# Patient Record
Sex: Male | Born: 1935 | Race: White | Hispanic: No | Smoking: Former smoker
Health system: Southern US, Community
[De-identification: ages and names within clinical notes are randomized; demographics above are authoritative.]

## PROBLEM LIST (undated history)

## (undated) DIAGNOSIS — H269 Unspecified cataract: Secondary | ICD-10-CM

## (undated) DIAGNOSIS — H409 Unspecified glaucoma: Secondary | ICD-10-CM

## (undated) DIAGNOSIS — C61 Malignant neoplasm of prostate: Secondary | ICD-10-CM

## (undated) DIAGNOSIS — Z66 Do not resuscitate: Secondary | ICD-10-CM

## (undated) DIAGNOSIS — F0390 Unspecified dementia without behavioral disturbance: Secondary | ICD-10-CM

## (undated) DIAGNOSIS — J301 Allergic rhinitis due to pollen: Secondary | ICD-10-CM

## (undated) DIAGNOSIS — E039 Hypothyroidism, unspecified: Secondary | ICD-10-CM

## (undated) DIAGNOSIS — N182 Chronic kidney disease, stage 2 (mild): Secondary | ICD-10-CM

## (undated) DIAGNOSIS — K5790 Diverticulosis of intestine, part unspecified, without perforation or abscess without bleeding: Secondary | ICD-10-CM

## (undated) DIAGNOSIS — R609 Edema, unspecified: Secondary | ICD-10-CM

## (undated) DIAGNOSIS — E785 Hyperlipidemia, unspecified: Secondary | ICD-10-CM

## (undated) DIAGNOSIS — I10 Essential (primary) hypertension: Secondary | ICD-10-CM

## (undated) DIAGNOSIS — K219 Gastro-esophageal reflux disease without esophagitis: Secondary | ICD-10-CM

## (undated) DIAGNOSIS — R269 Unspecified abnormalities of gait and mobility: Secondary | ICD-10-CM

## (undated) DIAGNOSIS — I2510 Atherosclerotic heart disease of native coronary artery without angina pectoris: Secondary | ICD-10-CM

## (undated) DIAGNOSIS — M109 Gout, unspecified: Secondary | ICD-10-CM

## (undated) DIAGNOSIS — R739 Hyperglycemia, unspecified: Secondary | ICD-10-CM

## (undated) DIAGNOSIS — E559 Vitamin D deficiency, unspecified: Secondary | ICD-10-CM

## (undated) DIAGNOSIS — M1990 Unspecified osteoarthritis, unspecified site: Secondary | ICD-10-CM

## (undated) DIAGNOSIS — K449 Diaphragmatic hernia without obstruction or gangrene: Secondary | ICD-10-CM

## (undated) DIAGNOSIS — I219 Acute myocardial infarction, unspecified: Secondary | ICD-10-CM

## (undated) HISTORY — DX: Acute myocardial infarction, unspecified: I21.9

## (undated) HISTORY — DX: Diaphragmatic hernia without obstruction or gangrene: K44.9

## (undated) HISTORY — DX: Allergic rhinitis due to pollen: J30.1

## (undated) HISTORY — DX: Unspecified abnormalities of gait and mobility: R26.9

## (undated) HISTORY — DX: Atherosclerotic heart disease of native coronary artery without angina pectoris: I25.10

## (undated) HISTORY — DX: Unspecified glaucoma: H40.9

## (undated) HISTORY — DX: Hyperglycemia, unspecified: R73.9

## (undated) HISTORY — PX: COLONOSCOPY: SHX174

## (undated) HISTORY — DX: Gout, unspecified: M10.9

## (undated) HISTORY — DX: Chronic kidney disease, stage 2 (mild): N18.2

---

## 1997-05-15 HISTORY — PX: EYE SURGERY: SHX253

## 1999-02-25 ENCOUNTER — Other Ambulatory Visit: Admission: RE | Admit: 1999-02-25 | Discharge: 1999-02-25 | Payer: Self-pay | Admitting: Gastroenterology

## 2000-12-21 ENCOUNTER — Other Ambulatory Visit: Admission: RE | Admit: 2000-12-21 | Discharge: 2000-12-21 | Payer: Self-pay | Admitting: Urology

## 2000-12-25 ENCOUNTER — Ambulatory Visit: Admission: RE | Admit: 2000-12-25 | Discharge: 2001-03-25 | Payer: Self-pay | Admitting: Radiation Oncology

## 2001-03-26 ENCOUNTER — Ambulatory Visit: Admission: RE | Admit: 2001-03-26 | Discharge: 2001-06-24 | Payer: Self-pay | Admitting: Radiation Oncology

## 2002-05-15 HISTORY — PX: SKIN CANCER EXCISION: SHX779

## 2003-12-01 ENCOUNTER — Ambulatory Visit: Admission: RE | Admit: 2003-12-01 | Discharge: 2003-12-01 | Payer: Self-pay | Admitting: Radiation Oncology

## 2004-12-07 ENCOUNTER — Ambulatory Visit: Admission: RE | Admit: 2004-12-07 | Discharge: 2004-12-21 | Payer: Self-pay | Admitting: Radiation Oncology

## 2010-06-28 ENCOUNTER — Emergency Department (HOSPITAL_COMMUNITY)
Admission: EM | Admit: 2010-06-28 | Discharge: 2010-06-28 | Disposition: A | Discharge status: Home / Self Care | Payer: Medicare Other | Attending: Emergency Medicine | Admitting: Emergency Medicine

## 2012-05-15 DIAGNOSIS — R739 Hyperglycemia, unspecified: Secondary | ICD-10-CM

## 2012-05-15 HISTORY — DX: Hyperglycemia, unspecified: R73.9

## 2012-06-01 ENCOUNTER — Encounter (HOSPITAL_COMMUNITY): Payer: Self-pay | Admitting: *Deleted

## 2012-06-01 ENCOUNTER — Inpatient Hospital Stay (HOSPITAL_COMMUNITY)
Admission: EM | Admit: 2012-06-01 | Discharge: 2012-06-05 | DRG: 282 | Disposition: A | Discharge status: Skilled Nursing Facility | Payer: Medicare Other | Attending: Cardiology | Admitting: Cardiology

## 2012-06-01 ENCOUNTER — Emergency Department (HOSPITAL_COMMUNITY): Payer: Medicare Other

## 2012-06-01 DIAGNOSIS — M129 Arthropathy, unspecified: Secondary | ICD-10-CM | POA: Diagnosis present

## 2012-06-01 DIAGNOSIS — I129 Hypertensive chronic kidney disease with stage 1 through stage 4 chronic kidney disease, or unspecified chronic kidney disease: Secondary | ICD-10-CM | POA: Diagnosis present

## 2012-06-01 DIAGNOSIS — E876 Hypokalemia: Secondary | ICD-10-CM

## 2012-06-01 DIAGNOSIS — I214 Non-ST elevation (NSTEMI) myocardial infarction: Principal | ICD-10-CM

## 2012-06-01 DIAGNOSIS — Z8546 Personal history of malignant neoplasm of prostate: Secondary | ICD-10-CM

## 2012-06-01 DIAGNOSIS — R079 Chest pain, unspecified: Secondary | ICD-10-CM

## 2012-06-01 DIAGNOSIS — I10 Essential (primary) hypertension: Secondary | ICD-10-CM | POA: Diagnosis present

## 2012-06-01 DIAGNOSIS — Z79899 Other long term (current) drug therapy: Secondary | ICD-10-CM

## 2012-06-01 DIAGNOSIS — Z66 Do not resuscitate: Secondary | ICD-10-CM | POA: Diagnosis present

## 2012-06-01 DIAGNOSIS — E559 Vitamin D deficiency, unspecified: Secondary | ICD-10-CM | POA: Diagnosis present

## 2012-06-01 DIAGNOSIS — I255 Ischemic cardiomyopathy: Secondary | ICD-10-CM

## 2012-06-01 DIAGNOSIS — I2589 Other forms of chronic ischemic heart disease: Secondary | ICD-10-CM | POA: Diagnosis present

## 2012-06-01 DIAGNOSIS — Z87891 Personal history of nicotine dependence: Secondary | ICD-10-CM

## 2012-06-01 DIAGNOSIS — N183 Chronic kidney disease, stage 3 unspecified: Secondary | ICD-10-CM | POA: Diagnosis present

## 2012-06-01 DIAGNOSIS — E785 Hyperlipidemia, unspecified: Secondary | ICD-10-CM | POA: Diagnosis present

## 2012-06-01 DIAGNOSIS — E039 Hypothyroidism, unspecified: Secondary | ICD-10-CM | POA: Diagnosis present

## 2012-06-01 DIAGNOSIS — K219 Gastro-esophageal reflux disease without esophagitis: Secondary | ICD-10-CM | POA: Diagnosis present

## 2012-06-01 HISTORY — DX: Diverticulosis of intestine, part unspecified, without perforation or abscess without bleeding: K57.90

## 2012-06-01 HISTORY — DX: Malignant neoplasm of prostate: C61

## 2012-06-01 HISTORY — DX: Vitamin D deficiency, unspecified: E55.9

## 2012-06-01 HISTORY — DX: Essential (primary) hypertension: I10

## 2012-06-01 HISTORY — DX: Unspecified cataract: H26.9

## 2012-06-01 HISTORY — DX: Gastro-esophageal reflux disease without esophagitis: K21.9

## 2012-06-01 HISTORY — DX: Hypothyroidism, unspecified: E03.9

## 2012-06-01 HISTORY — DX: Hyperlipidemia, unspecified: E78.5

## 2012-06-01 HISTORY — DX: Unspecified osteoarthritis, unspecified site: M19.90

## 2012-06-01 HISTORY — DX: Do not resuscitate: Z66

## 2012-06-01 LAB — GLUCOSE, CAPILLARY
Glucose-Capillary: 120 mg/dL — ABNORMAL HIGH (ref 70–99)
Glucose-Capillary: 115 mg/dL — ABNORMAL HIGH (ref 70–99)
Glucose-Capillary: 121 mg/dL — ABNORMAL HIGH (ref 70–99)

## 2012-06-01 LAB — CBC WITH DIFFERENTIAL/PLATELET
Basophils Absolute: 0 10*3/uL (ref 0.0–0.1)
Basophils Relative: 1 % (ref 0–1)
HCT: 39.2 % (ref 39.0–52.0)
Hemoglobin: 13.7 g/dL (ref 13.0–17.0)
Lymphocytes Relative: 31 % (ref 12–46)
MCHC: 34.9 g/dL (ref 30.0–36.0)
Monocytes Absolute: 0.6 10*3/uL (ref 0.1–1.0)
Neutro Abs: 3.4 10*3/uL (ref 1.7–7.7)
Neutrophils Relative %: 57 % (ref 43–77)
RDW: 12.2 % (ref 11.5–15.5)
WBC: 6 10*3/uL (ref 4.0–10.5)

## 2012-06-01 LAB — POCT I-STAT, CHEM 8
Calcium, Ion: 1.09 mmol/L — ABNORMAL LOW (ref 1.13–1.30)
Glucose, Bld: 151 mg/dL — ABNORMAL HIGH (ref 70–99)
HCT: 38 % — ABNORMAL LOW (ref 39.0–52.0)
Hemoglobin: 12.9 g/dL — ABNORMAL LOW (ref 13.0–17.0)
TCO2: 27 mmol/L (ref 0–100)

## 2012-06-01 LAB — BASIC METABOLIC PANEL
BUN: 24 mg/dL — ABNORMAL HIGH (ref 6–23)
BUN: 24 mg/dL — ABNORMAL HIGH (ref 6–23)
CO2: 23 mEq/L (ref 19–32)
CO2: 24 mEq/L (ref 19–32)
Chloride: 96 mEq/L (ref 96–112)
Chloride: 96 mEq/L (ref 96–112)
Chloride: 94 mEq/L — ABNORMAL LOW (ref 96–112)
Creatinine, Ser: 1.33 mg/dL (ref 0.50–1.35)
Creatinine, Ser: 1.25 mg/dL (ref 0.50–1.35)
Creatinine, Ser: 1.12 mg/dL (ref 0.50–1.35)
GFR calc Af Amer: 53 mL/min — ABNORMAL LOW (ref >90)
GFR calc non Af Amer: 45 mL/min — ABNORMAL LOW (ref >90)
Glucose, Bld: 144 mg/dL — ABNORMAL HIGH (ref 70–99)
Potassium: 2.8 mEq/L — ABNORMAL LOW (ref 3.5–5.1)

## 2012-06-01 LAB — CBC
HCT: 41.7 % (ref 39.0–52.0)
Hemoglobin: 14.9 g/dL (ref 13.0–17.0)
Hemoglobin: 14.4 g/dL (ref 13.0–17.0)
MCV: 98.6 fL (ref 78.0–100.0)
MCV: 98.8 fL (ref 78.0–100.0)
Platelets: 215 10*3/uL (ref 150–400)
Platelets: 195 10*3/uL (ref 150–400)
RBC: 4.23 MIL/uL (ref 4.22–5.81)
RBC: 4.05 MIL/uL — ABNORMAL LOW (ref 4.22–5.81)
WBC: 7.1 10*3/uL (ref 4.0–10.5)
WBC: 5.9 10*3/uL (ref 4.0–10.5)

## 2012-06-01 LAB — PROTIME-INR
INR: 0.92 (ref 0.00–1.49)
Prothrombin Time: 13.7 seconds (ref 11.6–15.2)

## 2012-06-01 LAB — TROPONIN I
Troponin I: 1.1 ng/mL (ref <0.30)
Troponin I: 1.25 ng/mL (ref <0.30)

## 2012-06-01 LAB — POCT I-STAT TROPONIN I: Troponin i, poc: 0.35 ng/mL (ref 0.00–0.08)

## 2012-06-01 LAB — APTT: aPTT: 24 seconds (ref 24–37)

## 2012-06-01 MED ORDER — NITROGLYCERIN 0.4 MG SL SUBL: 0.4000 mg | SUBLINGUAL_TABLET | SUBLINGUAL | Status: DC | PRN

## 2012-06-01 MED ORDER — SODIUM CHLORIDE 0.9 % IV SOLN
INTRAVENOUS | Status: AC
  Administered 2012-06-01: 1000 mL via INTRAVENOUS

## 2012-06-01 MED ORDER — HEPARIN BOLUS VIA INFUSION
5000.0000 [IU] | Freq: Once | INTRAVENOUS | Status: AC
  Administered 2012-06-01: 5000 [IU] via INTRAVENOUS

## 2012-06-01 MED ORDER — SODIUM CHLORIDE 0.9 % IV SOLN: INTRAVENOUS | Status: DC

## 2012-06-01 MED ORDER — GI COCKTAIL ~~LOC~~
30.0000 mL | Freq: Three times a day (TID) | ORAL | Status: DC | PRN
  Filled 2012-06-01: qty 30

## 2012-06-01 MED ORDER — ATORVASTATIN CALCIUM 20 MG PO TABS
20.0000 mg | ORAL_TABLET | Freq: Every day | ORAL | Status: DC
  Administered 2012-06-01 – 2012-06-05 (×5): 20 mg via ORAL
  Filled 2012-06-01 (×5): qty 1

## 2012-06-01 MED ORDER — ACETAMINOPHEN 325 MG PO TABS: 650.0000 mg | ORAL_TABLET | Freq: Four times a day (QID) | ORAL | Status: DC | PRN

## 2012-06-01 MED ORDER — LATANOPROST 0.005 % OP SOLN
1.0000 [drp] | Freq: Every day | OPHTHALMIC | Status: DC
  Administered 2012-06-01 – 2012-06-04 (×4): 1 [drp] via OPHTHALMIC
  Filled 2012-06-01 (×2): qty 2.5

## 2012-06-01 MED ORDER — ONDANSETRON HCL 4 MG/2ML IJ SOLN
4.0000 mg | Freq: Four times a day (QID) | INTRAMUSCULAR | Status: DC | PRN
  Administered 2012-06-01: 4 mg via INTRAVENOUS
  Filled 2012-06-01: qty 2

## 2012-06-01 MED ORDER — NITROGLYCERIN IN D5W 200-5 MCG/ML-% IV SOLN
10.0000 ug/min | INTRAVENOUS | Status: DC
  Administered 2012-06-01: 10 ug/min via INTRAVENOUS

## 2012-06-01 MED ORDER — LOSARTAN POTASSIUM 50 MG PO TABS
100.0000 mg | ORAL_TABLET | Freq: Every day | ORAL | Status: DC
  Administered 2012-06-01 – 2012-06-02 (×2): 100 mg via ORAL
  Filled 2012-06-01 (×3): qty 2

## 2012-06-01 MED ORDER — POTASSIUM CHLORIDE CRYS ER 20 MEQ PO TBCR
40.0000 meq | EXTENDED_RELEASE_TABLET | Freq: Once | ORAL | Status: AC
  Administered 2012-06-01: 40 meq via ORAL
  Filled 2012-06-01: qty 2

## 2012-06-01 MED ORDER — HEPARIN (PORCINE) IN NACL 100-0.45 UNIT/ML-% IJ SOLN
12.0000 [IU]/kg/h | INTRAMUSCULAR | Status: DC
  Administered 2012-06-01: 12 [IU]/kg/h via INTRAVENOUS
  Filled 2012-06-01: qty 250

## 2012-06-01 MED ORDER — SODIUM CHLORIDE 0.9 % IV SOLN
Freq: Once | INTRAVENOUS | Status: AC
  Administered 2012-06-01: 03:00:00 via INTRAVENOUS

## 2012-06-01 MED ORDER — NITROGLYCERIN 2 % TD OINT
1.0000 [in_us] | TOPICAL_OINTMENT | Freq: Four times a day (QID) | TRANSDERMAL | Status: DC
  Administered 2012-06-01: 1 [in_us] via TOPICAL
  Filled 2012-06-01: qty 30

## 2012-06-01 MED ORDER — ONDANSETRON HCL 4 MG/2ML IJ SOLN
INTRAMUSCULAR | Status: AC
  Administered 2012-06-01: 4 mg
  Filled 2012-06-01: qty 2

## 2012-06-01 MED ORDER — POTASSIUM CHLORIDE 10 MEQ/100ML IV SOLN
10.0000 meq | Freq: Once | INTRAVENOUS | Status: AC
  Administered 2012-06-01: 10 meq via INTRAVENOUS
  Filled 2012-06-01: qty 100

## 2012-06-01 MED ORDER — CLOPIDOGREL BISULFATE 75 MG PO TABS
75.0000 mg | ORAL_TABLET | Freq: Every day | ORAL | Status: DC
  Administered 2012-06-01 – 2012-06-05 (×5): 75 mg via ORAL
  Filled 2012-06-01 (×7): qty 1

## 2012-06-01 MED ORDER — NITROGLYCERIN IN D5W 200-5 MCG/ML-% IV SOLN
INTRAVENOUS | Status: AC
  Administered 2012-06-01: 18:00:00
  Filled 2012-06-01: qty 250

## 2012-06-01 MED ORDER — MORPHINE SULFATE 2 MG/ML IJ SOLN
2.0000 mg | INTRAMUSCULAR | Status: DC | PRN
  Administered 2012-06-01 (×2): 2 mg via INTRAVENOUS
  Filled 2012-06-01 (×2): qty 1

## 2012-06-01 MED ORDER — ALUM & MAG HYDROXIDE-SIMETH 200-200-20 MG/5ML PO SUSP
30.0000 mL | ORAL | Status: DC | PRN
  Administered 2012-06-01 (×2): 30 mL via ORAL
  Filled 2012-06-01 (×2): qty 30

## 2012-06-01 MED ORDER — ASPIRIN 81 MG PO CHEW: 324.0000 mg | CHEWABLE_TABLET | Freq: Once | ORAL | Status: DC

## 2012-06-01 MED ORDER — LEVOTHYROXINE SODIUM 50 MCG PO TABS
50.0000 ug | ORAL_TABLET | Freq: Every day | ORAL | Status: DC
  Administered 2012-06-01 – 2012-06-05 (×5): 50 ug via ORAL
  Filled 2012-06-01 (×7): qty 1

## 2012-06-01 MED ORDER — VITAMIN D3 25 MCG (1000 UNIT) PO TABS
2000.0000 [IU] | ORAL_TABLET | Freq: Every day | ORAL | Status: DC
  Administered 2012-06-01 – 2012-06-05 (×5): 2000 [IU] via ORAL
  Filled 2012-06-01 (×5): qty 2

## 2012-06-01 MED ORDER — HYDROCHLOROTHIAZIDE 25 MG PO TABS
25.0000 mg | ORAL_TABLET | Freq: Every day | ORAL | Status: DC
  Administered 2012-06-01 – 2012-06-02 (×2): 25 mg via ORAL
  Filled 2012-06-01 (×3): qty 1

## 2012-06-01 MED ORDER — ALLOPURINOL 100 MG PO TABS
100.0000 mg | ORAL_TABLET | Freq: Every day | ORAL | Status: DC
Start: 2012-06-01 — End: 2012-06-05
  Administered 2012-06-01 – 2012-06-05 (×5): 100 mg via ORAL
  Filled 2012-06-01 (×5): qty 1

## 2012-06-01 MED ORDER — CALCIUM CARBONATE ANTACID 500 MG PO CHEW
1.0000 | CHEWABLE_TABLET | Freq: Three times a day (TID) | ORAL | Status: DC | PRN
  Administered 2012-06-01: 200 mg via ORAL
  Filled 2012-06-01 (×2): qty 1

## 2012-06-01 MED ORDER — METOPROLOL TARTRATE 50 MG PO TABS
50.0000 mg | ORAL_TABLET | Freq: Every day | ORAL | Status: DC
  Administered 2012-06-01 – 2012-06-02 (×2): 50 mg via ORAL
  Filled 2012-06-01 (×4): qty 1

## 2012-06-01 MED ORDER — HEPARIN (PORCINE) IN NACL 100-0.45 UNIT/ML-% IJ SOLN
900.0000 [IU]/h | INTRAMUSCULAR | Status: DC
  Administered 2012-06-01 – 2012-06-03 (×3): 900 [IU]/h via INTRAVENOUS
  Filled 2012-06-01 (×5): qty 250

## 2012-06-01 MED ORDER — CALCIUM CARBONATE ANTACID 500 MG PO CHEW
1.0000 | CHEWABLE_TABLET | Freq: Three times a day (TID) | ORAL | Status: DC | PRN
  Filled 2012-06-01 (×2): qty 1

## 2012-06-01 NOTE — Progress Notes (Addendum)
Called by RN re: increasing troponin and recurrent chest pain  Pt was having some pain earlier today and was started on NTG paste and heparin. He had recurrent pain this pm and his troponin has gone higher.   Currently, he is complaining of 2/10 chest pain, denies SOB, N&V, diaphoresis. With his confusion, we are unable to determine exactly how long he has been hurting.   ECG: SR, no pathologic Q waves and no ST elevation.  Cardiac Panel (last 3 results)  Basename 06/01/12 1519 06/01/12 0848 06/01/12 0424  CKTOTAL -- -- --  CKMB -- -- --  TROPONINI 13.43* 4.70* 1.25*  RELINDX -- -- --   Fellow in to see pt, family including son/dtr and their spouses present. They are reluctant to cath and feel cath should be deferred unless pt has intractable pain, not responsive to medications, or has ST elevation. Will change to IV NTG and transfer to stepdown.  No ASA so far due to allergy but the patient states he has taken it recently without problems. He is tolerating Plavix. On BB/statin. Will order echo, f/u on results.

## 2012-06-01 NOTE — ED Provider Notes (Signed)
History     CSN: [id number]  Arrival date & time 06/01/12  0221   First MD Initiated Contact with Patient 06/01/12 [phone]      Chief Complaint  Patient presents with  . Chest Pain    (Consider location/radiation/quality/duration/timing/severity/associated sxs/prior treatment) HPI Comments: 79 year old male with history of hyperlipidemia, prostate cancer who presents with chest pain which started while he was eating dinner last night, described as an uncomfortable and uneasy feeling in the middle of his chest with no radiation and no associated shortness of breath. This evening he awoke with increased pain associated with nausea and called for ambulance transport. On arrival the paramedics found the patient to be diaphoretic and pale, EKG was equal vocal 4 STEMI thus they activated STEMI protocol and transported emergently to the emergency department. On arrival the patient states that his symptoms are persistent, they have not seemed to improve much with nitroglycerin or a GI cocktail which was given prior to arrival. He denies any history of having coronary artery disease and review of the medical record confirms that he has had no diagnosed coronary artery disease. There is not appear any stress test or heart catheterizations.  The history is provided by the patient, medical records and the EMS personnel.    Past Medical History  Diagnosis Date  . Hyperlipemia   . Diverticulosis   . Prostate cancer   . Hypothyroid   . Vitamin D deficiency disease   . Cataracts, bilateral   . GERD (gastroesophageal reflux disease)   . Arthritis   . Hypertension   . DNR (do not resuscitate)     Past Surgical History  Procedure Date  . Colonoscopy     No family history on file.  History  Substance Use Topics  . Smoking status: Former Research scientist (life sciences)  . Smokeless tobacco: Not on file  . Alcohol Use: No      Review of Systems  All other systems reviewed and are negative.    Allergies    Aspirin and Ibuprofen  Home Medications   Current Outpatient Rx  Name  Route  Sig  Dispense  Refill  . ACETAMINOPHEN 325 MG PO TABS   Oral   Take 650 mg by mouth every 6 (six) hours as needed. For pain         . ALLOPURINOL 100 MG PO TABS   Oral   Take 100 mg by mouth daily.         . ATORVASTATIN CALCIUM 20 MG PO TABS   Oral   Take 20 mg by mouth daily.         Marland Kitchen VITAMIN D 1000 UNITS PO TABS   Oral   Take 2,000 Units by mouth daily.         Marland Kitchen HYDROCHLOROTHIAZIDE 25 MG PO TABS   Oral   Take 25 mg by mouth daily.         Marland Kitchen LATANOPROST 0.005 % OP SOLN   Right Eye   Place 1 drop into the right eye at bedtime.         Marland Kitchen LEVOTHYROXINE SODIUM 50 MCG PO TABS   Oral   Take 50 mcg by mouth daily.         Marland Kitchen LOSARTAN POTASSIUM 100 MG PO TABS   Oral   Take 100 mg by mouth daily.         Marland Kitchen METOPROLOL TARTRATE 50 MG PO TABS   Oral   Take 50 mg by mouth daily.  BP 124/65  Pulse 56  Temp 96.4 F (35.8 C) (Rectal)  Resp 17  SpO2 97%  Physical Exam  Nursing note and vitals reviewed. Constitutional: He appears well-developed and well-nourished.       Uncomfortable appearing  HENT:  Head: Normocephalic and atraumatic.  Mouth/Throat: Oropharynx is clear and moist. No oropharyngeal exudate.  Eyes: Conjunctivae normal and EOM are normal. Pupils are equal, round, and reactive to light. Right eye exhibits no discharge. Left eye exhibits no discharge. No scleral icterus.  Neck: Normal range of motion. Neck supple. No JVD present. No thyromegaly present.  Cardiovascular: Normal rate, regular rhythm, normal heart sounds and intact distal pulses.  Exam reveals no gallop and no friction rub.   No murmur heard. Pulmonary/Chest: Effort normal and breath sounds normal. No respiratory distress. He has no wheezes. He has no rales.  Abdominal: Soft. Bowel sounds are normal. He exhibits no distension and no mass. There is no tenderness.  Musculoskeletal: Normal  range of motion. He exhibits no edema and no tenderness.  Lymphadenopathy:    He has no cervical adenopathy.  Neurological: He is alert. Coordination normal.  Skin: Skin is warm and dry. No rash noted. No erythema.  Psychiatric: He has a normal mood and affect. His behavior is normal.    ED Course  Procedures (including critical care time)  Labs Reviewed  BASIC METABOLIC PANEL - Abnormal; Notable for the following:    Potassium 2.8 (*)     Glucose, Bld 152 (*)     BUN 24 (*)     GFR calc non Af Amer 45 (*)     GFR calc Af Amer 53 (*)     All other components within normal limits  CBC WITH DIFFERENTIAL - Abnormal; Notable for the following:    RBC 4.02 (*)     MCH 34.1 (*)     All other components within normal limits  TROPONIN I - Abnormal; Notable for the following:    Troponin I 1.10 (*)     All other components within normal limits  POCT I-STAT, CHEM 8 - Abnormal; Notable for the following:    Potassium 2.9 (*)     Creatinine, Ser 1.50 (*)     Glucose, Bld 151 (*)     Calcium, Ion 1.09 (*)     Hemoglobin 12.9 (*)     HCT 38.0 (*)     All other components within normal limits  POCT I-STAT TROPONIN I - Abnormal; Notable for the following:    Troponin i, poc 0.35 (*)     All other components within normal limits  PROTIME-INR  APTT   Dg Chest Port 1 View  06/01/2012  *RADIOLOGY REPORT*  Clinical Data: Chest pain and emesis.  PORTABLE CHEST - 1 VIEW  Comparison: None.  Findings: Shallow inspiration.  Borderline heart size with normal pulmonary vascularity.  No focal airspace consolidation in the lungs.  No blunting of costophrenic angles.  No pneumothorax. Mediastinal contours appear intact.  IMPRESSION: Mild cardiac enlargement.  No evidence of active pulmonary disease.   Original Report Authenticated By: Lucienne Capers, M.D.      1. NSTEMI (non-ST elevated myocardial infarction)   2. Hypokalemia       MDM  The patient is a slight bradycardia, his EKG shows no  acute ischemic changes and I have canceled his STEMI activation. The cardiologist has arrived at the bedside and agrees cancellation of the STEMI. Lab workup to ensue, aspirin, chest x-ray, labs.  ED ECG REPORT  I personally interpreted this EKG   Date: 06/01/2012   Rate: 62  Rhythm: normal sinus rhythm  QRS Axis: normal  Intervals: normal  ST/T Wave abnormalities: normal  Conduction Disutrbances:none  Narrative Interpretation: Poor R-wave progression  Old EKG Reviewed: none available  Patient reevaluated and feels some improvement. His EKG was nonischemic however his troponin has come back greater than 1.0. This is indicative of an acute myocardial infarction. I have ordered heparin bolus and drip and discussed his care with the cardiologist who will come to see the patient in the emergency department. Critical care is being delivered at this time. The patient states that he would not want to be placed on life support, he has agreed to be a DO NOT RESUSCITATE but does want to consider heart catheterization.  Labs show that he has mild renal insufficiency and hypokalemia, replacement has been given.  CRITICAL CARE Performed by: Johnna Acosta   Total critical care time: 35  Critical care time was exclusive of separately billable procedures and treating other patients.  Critical care was necessary to treat or prevent imminent or life-threatening deterioration.  Critical care was time spent personally by me on the following activities: development of treatment plan with patient and/or surrogate as well as nursing, discussions with consultants, evaluation of patient's response to treatment, examination of patient, obtaining history from patient or surrogate, ordering and performing treatments and interventions, ordering and review of laboratory studies, ordering and review of radiographic studies, pulse oximetry and re-evaluation of patient's condition.        Johnna Acosta,  MD 06/01/12 [phone]

## 2012-06-01 NOTE — Progress Notes (Signed)
ANTICOAGULATION CONSULT NOTE - Initial Consult  Pharmacy Consult for heparin Indication: chest pain/ACS  Allergies  Allergen Reactions  . Aspirin Rash  . Ibuprofen Rash    Patient Measurements: Height: 5\' 9"  (175.3 cm) Weight: 165 lb (74.844 kg) IBW/kg (Calculated) : 70.7   Vital Signs: Temp: 96.4 F (35.8 C) (01/18 0305) Temp src: Rectal (01/18 0305) BP: 145/73 mmHg (01/18 0415) Pulse Rate: 62  (01/18 0415)  Labs:  Basename 06/01/12 0235 06/01/12 0229  HGB 12.9* 13.7  HCT 38.0* 39.2  PLT -- 197  APTT -- 24  LABPROT -- 12.3  INR -- 0.92  HEPARINUNFRC -- --  CREATININE 1.50* 1.33  CKTOTAL -- --  CKMB -- --  TROPONINI -- 1.10*    Estimated Creatinine Clearance: 32.7 ml/min (by C-G formula based on Cr of 1.5).   Medical History: Past Medical History  Diagnosis Date  . Hyperlipemia   . Diverticulosis   . Prostate cancer   . Hypothyroid   . Vitamin D deficiency disease   . Cataracts, bilateral   . GERD (gastroesophageal reflux disease)   . Arthritis   . Hypertension   . DNR (do not resuscitate)      Assessment: 79yo male NH resident c/o CP associated with nausea and possible syncopal event, initially called as code STEMI which was subsequently downgraded, troponin elevated, to continue heparin begun in ED.  Goal of Therapy:  Heparin level 0.3-0.7 units/ml Monitor platelets by anticoagulation protocol: Yes   Plan:  EDMD gave heparin bolus of 5000 units x1 followed by gtt at 900 units/hr; will continue gtt at current rate and monitor heparin levels and CBC.  Rogue Bussing PharmD BCPS 06/01/2012,4:23 AM

## 2012-06-01 NOTE — Progress Notes (Signed)
Called MD on call due to Trop of 4.70.  Lab did not call but daughter wanted to know if results were ready.  NP Rosaria Ferries added some orders.  Pt is pain free at this time.  I will continue to monitor. Damon Miller

## 2012-06-01 NOTE — Progress Notes (Signed)
   Subjective:  Denies dyspnea; mild chest pain persistes   Objective:  Filed Vitals:   06/01/12 0331 06/01/12 0400 06/01/12 0415 06/01/12 0519  BP:  125/71 145/73 148/71  Pulse:  80 62 73  Temp:    97.2 F (36.2 C)  TempSrc:    Oral  Resp:  30 15 21   Height: 5\' 9"  (1.753 m)     Weight: 165 lb (74.844 kg)   163 lb 2.3 oz (74 kg)  SpO2:  100% 100% 100%    Intake/Output from previous day:  Intake/Output Summary (Last 24 hours) at 06/01/12 0813 Last data filed at 06/01/12 0500  Gross per 24 hour  Intake      0 ml  Output    150 ml  Net   -150 ml    Physical Exam: Physical exam: Well-developed well-nourished in no acute distress.  Skin is warm and dry.  HEENT is normal.  Neck is supple.  Chest is clear to auscultation with normal expansion.  Cardiovascular exam is regular rate and rhythm.  Abdominal exam nontender or distended. No masses palpated. Extremities show no edema. neuro grossly intact    Lab Results: Basic Metabolic Panel:  Basename 06/01/12 0424 06/01/12 0235 06/01/12 0229  NA 136 137 --  K 3.4* 2.9* --  CL 96 103 --  CO2 23 -- 24  GLUCOSE 144* 151* --  BUN 24* 22 --  CREATININE 1.25 1.50* --  CALCIUM 10.0 -- 9.7  MG -- -- --  PHOS -- -- --   CBC:  Basename 06/01/12 0424 06/01/12 0235 06/01/12 0229  WBC 7.1 -- 6.0  NEUTROABS -- -- 3.4  HGB 14.9 12.9* --  HCT 41.7 38.0* --  MCV 98.6 -- 97.5  PLT 215 -- 197   Cardiac Enzymes:  Basename 06/01/12 0424 06/01/12 0229  CKTOTAL -- --  CKMB -- --  CKMBINDEX -- --  TROPONINI 1.25* 1.10*     Assessment/Plan:  1 non-ST elevation myocardial infarction-the patient's chest pain is somewhat atypical. It has been persistent. His electrocardiogram is unrevealing. His troponin is mildly elevated but there is no clear trend. I discussed this in detail with his son and the patient. They are unsure about how aggressive they would like to be. We will plan to continue to cycle enzymes to look for trend.  Add Plavix (history of allergy to aspirin). Continue heparin, beta blocker and statin. We will make a decision tomorrow concerning medical therapy versus functional study versus cardiac catheterization. Await echocardiogram. 2 hypertension-continue present medications. 3 hyperlipidemia-continue statin. 4 no CODE BLUE  Kirk Ruths 06/01/2012, 8:13 AM

## 2012-06-01 NOTE — Progress Notes (Signed)
ANTICOAGULATION CONSULT NOTE - Follow Up Consult  Pharmacy Consult for Heparin Indication: chest pain/ACS  Allergies  Allergen Reactions  . Aspirin Rash  . Ibuprofen Rash    Patient Measurements: Height: 5\' 9"  (175.3 cm) Weight: 163 lb 2.3 oz (74 kg) IBW/kg (Calculated) : 70.7  Heparin Dosing Weight:   Vital Signs: Temp: 97.2 F (36.2 C) (01/18 0519) Temp src: Oral (01/18 0519) BP: 148/70 mmHg (01/18 1243) Pulse Rate: 73  (01/18 0519)  Labs:  Basename 06/01/12 1135 06/01/12 0848 06/01/12 0424 06/01/12 0235 06/01/12 0229  HGB 14.4 -- 14.9 -- --  HCT 40.0 -- 41.7 38.0* --  PLT 195 -- 215 -- 197  APTT -- -- -- -- 24  LABPROT -- -- 13.7 -- 12.3  INR -- -- 1.06 -- 0.92  HEPARINUNFRC 0.58 -- -- -- --  CREATININE -- 1.12 1.25 1.50* --  CKTOTAL -- -- -- -- --  CKMB -- -- -- -- --  TROPONINI -- 4.70* 1.25* -- 1.10*    Estimated Creatinine Clearance: 43.8 ml/min (by C-G formula based on Cr of 1.12).  Assessment: NSTEMI  Anticoagulation: Persistent CP (atypical) this am. Heparin level 0.58 in goal range. CBC ok.  Infectious Disease: Afebrile. WBC 5.9. No abx.  Cardiovascular: h/o HTN, HLD (TC=251, TG 63, HDL 61, LDL 177). Await Echo and decision on plan of care. EKG ok, troponin 4.7. Add Plavix (ASA allergy) and continue Lipitor, HCTZ, losartan, metoprolol.  Endocrinology: Allopurinol for gout. Levothyroxine  Nephrology: Scr 1.12.  PTA Medication Issues: none  Goal of Therapy:  INR 2-3 Heparin level 0.3-0.7 units/ml Monitor platelets by anticoagulation protocol: Yes   Plan:  Continue heparin at 900 units/hr and check next HL tomorrow.  Alford Highland, [employer] 06/01/2012,12:50 PM

## 2012-06-01 NOTE — H&P (Signed)
Physician History and Physical    Damon Miller MRN: [id number] DOB/AGE: Dec 12, 1919 79 y.o. Admit date: 06/01/2012  Primary Cardiologist:  None documented in NH records  CC:  Chest pain  HPI:  Pt is a 79 yo man prior smoker with HTN, HLD who presents with chest pain.  Pt reports that for the past 3 days, he has noticed a chest tightness in the center of his chest that has pretty much been constant, but has intermittently gotten worse.  He reports that the maximum that the pain has been is 5/10.  He has been given nitro x 3 in ED without relief.  He is also nauseous and has dry heaves, but has not vomited.  He denies SOB, PND, orthopnea.  He denies abd pain.  He did not eat much for dinner last night because he was not feeling well.  Of note, pt was initially called code stemi by EMS, but EKGs did not show any ST elevations.    Additional hx obtained from pt's son (POA), [place], over the phone - he was with pt earlier in the week and he was doing fine, had no complaints.  He states that pt has been having worsening memory loss recently.  He states that the pt remains very active though.  Review of systems: A review of 10 organ systems was done and is negative except as stated above in HPI  Past Medical History  Diagnosis Date  . Hyperlipemia   . Diverticulosis   . Prostate cancer   . Hypothyroid   . Vitamin D deficiency disease   . Cataracts, bilateral   . GERD (gastroesophageal reflux disease)   . Arthritis   . Hypertension   . DNR (do not resuscitate)    Past Surgical History  Procedure Date  . Colonoscopy    History   Social History  . Marital Status: Married    Spouse Name: N/A    Number of Children: N/A  . Years of Education: N/A   Occupational History  . Not on file.   Social History Main Topics  . Smoking status: Former Research scientist (life sciences)  . Smokeless tobacco: Not on file  . Alcohol Use: No  . Drug Use: No  . Sexually Active:    Other Topics Concern  . Not on  file   Social History Narrative  . No narrative on file    No family history on file.   Allergies  Allergen Reactions  . Aspirin Rash  . Ibuprofen Rash     (Not in a hospital admission)  Current facility-administered medications:heparin ADULT infusion 100 units/mL (25000 units/250 mL), 12 Units/kg/hr, Intravenous, Continuous, Johnna Acosta, MD;  heparin bolus via infusion 5,000 Units, 5,000 Units, Intravenous, Once, Johnna Acosta, MD Current outpatient prescriptions:acetaminophen (TYLENOL) 325 MG tablet, Take 650 mg by mouth every 6 (six) hours as needed. For pain, Disp: , Rfl: ;  allopurinol (ZYLOPRIM) 100 MG tablet, Take 100 mg by mouth daily., Disp: , Rfl: ;  atorvastatin (LIPITOR) 20 MG tablet, Take 20 mg by mouth daily., Disp: , Rfl: ;  cholecalciferol (VITAMIN D) 1000 UNITS tablet, Take 2,000 Units by mouth daily., Disp: , Rfl:  hydrochlorothiazide (HYDRODIURIL) 25 MG tablet, Take 25 mg by mouth daily., Disp: , Rfl: ;  latanoprost (XALATAN) 0.005 % ophthalmic solution, Place 1 drop into the right eye at bedtime., Disp: , Rfl: ;  levothyroxine (SYNTHROID, LEVOTHROID) 50 MCG tablet, Take 50 mcg by mouth daily.,  Disp: , Rfl: ;  losartan (COZAAR) 100 MG tablet, Take 100 mg by mouth daily., Disp: , Rfl:  metoprolol (LOPRESSOR) 50 MG tablet, Take 50 mg by mouth daily., Disp: , Rfl:   Physical Exam: Blood pressure 124/65, pulse 56, temperature 96.4 F (35.8 C), temperature source Rectal, resp. rate 17, SpO2 97.00%.; There is no height or weight on file to calculate BMI. Temp:  [96.4 F (35.8 C)] 96.4 F (35.8 C) (01/18 0305) Pulse Rate:  [56] 56  (01/18 0305) Resp:  [17] 17  (01/18 0305) BP: (124)/(65) 124/65 mmHg (01/18 0305) SpO2:  [97 %-98 %] 97 % (01/18 0305)  No intake or output data in the 24 hours ending 06/01/12 0332 General: NAD Heent: MMM Neck: No JVD  CV: RRR, no m, no chest wall tenderness to palpation Lungs: Clear to auscultation bilaterally with normal respiratory  effort Abdomen: Soft, nontender, nondistended Extremities: No clubbing or cyanosis.  No pedal edema Skin: Intact without lesions or rashes  Neurologic: Alert and oriented x 2 (unable to state year, but knew name and location), grossly nonfocal  Psych: Normal mood and affect    Labs:  [facility] 06/01/12 0229  CKTOTAL --  CKMB --  TROPONINI 1.10*   Lab Results  Component Value Date   WBC 6.0 06/01/2012   HGB 12.9* 06/01/2012   HCT 38.0* 06/01/2012   MCV 97.5 06/01/2012   PLT 197 06/01/2012    Lab 06/01/12 0235 06/01/12 0229  NA 137 --  K 2.9* --  CL 103 --  CO2 -- 24  BUN 22 --  CREATININE 1.50* --  CALCIUM -- 9.7  PROT -- --  BILITOT -- --  ALKPHOS -- --  ALT -- --  AST -- --  GLUCOSE 151* --   No results found for this basename: CHOL, HDL, LDLCALC, TRIG     EKG:  Sinus, no acute ischemic change  Radiology:  Dg Chest Port 1 View  06/01/2012  *RADIOLOGY REPORT*  Clinical Data: Chest pain and emesis.  PORTABLE CHEST - 1 VIEW  Comparison: None.  Findings: Shallow inspiration.  Borderline heart size with normal pulmonary vascularity.  No focal airspace consolidation in the lungs.  No blunting of costophrenic angles.  No pneumothorax. Mediastinal contours appear intact.  IMPRESSION: Mild cardiac enlargement.  No evidence of active pulmonary disease.   Original Report Authenticated By: Lucienne Capers, M.D.     ASSESSMENT:  Pt is a 79 yo man prior smoker with HTN, HLD who presents with chest pain  PLAN:  Chest pain - atypical in description, EKGs with no acute ischemic changes, troponin is elevated to 1.  No relief of pain with nitro.  Ddx includes ACS vs GI source vs MSK vs ?PE vs ?dissection - pt has aspirin allergy (rash), so unable to give - morphine prn pain - he is currently CP free after morphine 2 mg IV x 1 - continue home BB, statin - heparin started in ED for trop of 1, will continue - tele, serial EKGs - trend troponins to peak - pt is DNR, but would be ok with  cath if he needed it - would prefer to be conservative in this pt - if CP free and enzymes only mildly elevated, will medically manage.  If unable to get CP free or if enzymes continue to rise, will consider cathing pt over the weekend - pt's son will be in later this am to help make decisions - check echo in am - will also  consider CTA or CT abd/pelvis if pt's sx do not improve  - try GI cocktail for pain as well  Hypokalemia - 10 mEq IV given in ED for K 2.8, will give 40 mEq more - repeat chem is ordered for 10 am  Cr 1.50 - unknown baseline.  Will give IVFs  HTN - BP controlled.  continue home meds  HLD - check lipids, continue statin  Hypothyroid - check TSH.  continue synthroid  Ppx - heparin gtt  Dispo - DNR, but pt would be ok with catheterization  POA: Bethena Roys Hannon: [phone] - updated him on pt's condition tonight   Signed: Lyn Records, MD Cardiology Fellow 06/01/2012, 3:32 AM

## 2012-06-01 NOTE — ED Notes (Signed)
MD at bedside.Cardiologists at bedside.

## 2012-06-01 NOTE — ED Notes (Signed)
Talked to son - Hoss Palermo on pts. Status. Is on his way from [place], [place].

## 2012-06-01 NOTE — ED Notes (Signed)
Substernal cp that started 1/17 ~ 10 pm; ~0300: woke up with substernal cp again; from assisted living; was given x 2 ntg. Sl. 0.4 mg and no relief ems gave x 1 ntg sl. 0.4 mg with no relief. Assisted living personnel stated, "?syncope episode." pt. Been nauseated.

## 2012-06-02 DIAGNOSIS — I359 Nonrheumatic aortic valve disorder, unspecified: Secondary | ICD-10-CM

## 2012-06-02 LAB — BASIC METABOLIC PANEL
GFR calc Af Amer: 58 mL/min — ABNORMAL LOW (ref >90)
GFR calc non Af Amer: 50 mL/min — ABNORMAL LOW (ref >90)
Potassium: 3.7 mEq/L (ref 3.5–5.1)
Sodium: 132 mEq/L — ABNORMAL LOW (ref 135–145)

## 2012-06-02 LAB — CBC
Platelets: 194 10*3/uL (ref 150–400)
RDW: 12.4 % (ref 11.5–15.5)
WBC: 8.9 10*3/uL (ref 4.0–10.5)

## 2012-06-02 MED ORDER — ASPIRIN 81 MG PO CHEW
CHEWABLE_TABLET | ORAL | Status: AC
  Administered 2012-06-02: 81 mg via ORAL
  Filled 2012-06-02: qty 1

## 2012-06-02 MED ORDER — ALPRAZOLAM 0.5 MG PO TABS
0.5000 mg | ORAL_TABLET | Freq: Once | ORAL | Status: AC
  Administered 2012-06-02: 0.5 mg via ORAL
  Filled 2012-06-02: qty 1

## 2012-06-02 MED ORDER — ASPIRIN 81 MG PO CHEW
81.0000 mg | CHEWABLE_TABLET | Freq: Every day | ORAL | Status: DC
  Administered 2012-06-03 – 2012-06-05 (×3): 81 mg via ORAL
  Filled 2012-06-02 (×3): qty 1

## 2012-06-02 NOTE — Progress Notes (Signed)
   Subjective:  Denies dyspnea; mild chest pain persists but "almost gone"   Objective:  Filed Vitals:   06/02/12 0600 06/02/12 0745 06/02/12 0800 06/02/12 0900  BP: 150/80  112/56 112/63  Pulse: 86  68 65  Temp:  98.2 F (36.8 C)    TempSrc:  Oral    Resp: 20  21 19   Height:      Weight:      SpO2: 97%  96% 96%    Intake/Output from previous day:  Intake/Output Summary (Last 24 hours) at 06/02/12 1117 Last data filed at 06/02/12 0900  Gross per 24 hour  Intake 457.25 ml  Output   1050 ml  Net -592.75 ml    Physical Exam: Physical exam: Well-developed well-nourished in no acute distress.  Skin is warm and dry.  HEENT is normal.  Neck is supple.  Chest is clear to auscultation with normal expansion.  Cardiovascular exam is regular rate and rhythm.  Abdominal exam nontender or distended. No masses palpated. Extremities show no edema. neuro grossly intact    Lab Results: Basic Metabolic Panel:  Basename 06/02/12 0517 06/01/12 0848  NA 132* 135  K 3.7 3.7  CL 96 94*  CO2 27 24  GLUCOSE 116* 120*  BUN 18 21  CREATININE 1.23 1.12  CALCIUM 9.0 9.6  MG -- --  PHOS -- --   CBC:  Basename 06/02/12 0517 06/01/12 1135 06/01/12 0229  WBC 8.9 5.9 --  NEUTROABS -- -- 3.4  HGB 13.7 14.4 --  HCT 39.3 40.0 --  MCV 100.3* 98.8 --  PLT 194 195 --   Cardiac Enzymes:  Basename 06/01/12 1955 06/01/12 1519 06/01/12 0848  CKTOTAL -- -- --  CKMB -- -- --  CKMBINDEX -- -- --  TROPONINI >20.00* 13.43* 4.70*     Assessment/Plan:  1 non-ST elevation myocardial infarction-patient has ruled in for myocardial infarction. His pain has almost completely resolved. Long discussion with patient and family this morning. We discussed options of continuing medical therapy versus cardiac catheterization. I explained the risks of proceeding with catheterization and also the risks of undiagnosed coronary disease. He is very clear that he would like to avoid procedures if possible  given his age. We will therefore plan medical therapy. Add aspirin 81 mg daily. Continue Plavix, nitroglycerin, heparin, statin and beta blocker. Plan for 72 hours of heparin. We can reconsider the above if his symptoms worsen or he has further complications. Await echocardiogram. 2 hypertension-continue present medications. 3 hyperlipidemia-continue statin. 4 no CODE BLUE  Kirk Ruths 06/02/2012, 11:17 AM

## 2012-06-02 NOTE — Progress Notes (Signed)
  Echocardiogram 2D Echocardiogram has been performed.  Diamond Nickel 06/02/2012, 12:24 PM

## 2012-06-02 NOTE — Progress Notes (Signed)
ANTICOAGULATION CONSULT NOTE - Follow Up Consult  Pharmacy Consult for Heparin Indication: chest pain/ACS  Allergies  Allergen Reactions  . Aspirin Rash  . Ibuprofen Rash    Patient Measurements: Height: 5\' 9"  (175.3 cm) Weight: 163 lb 2.3 oz (74 kg) IBW/kg (Calculated) : 70.7  Heparin Dosing Weight:   Vital Signs: Temp: 98.2 F (36.8 C) (01/19 0745) Temp src: Oral (01/19 0745) BP: 150/80 mmHg (01/19 0600) Pulse Rate: 86  (01/19 0600)  Labs:  Basename 06/02/12 0517 06/01/12 1955 06/01/12 1519 06/01/12 1135 06/01/12 0848 06/01/12 0424 06/01/12 0229  HGB 13.7 -- -- 14.4 -- -- --  HCT 39.3 -- -- 40.0 -- 41.7 --  PLT 194 -- -- 195 -- 215 --  APTT -- -- -- -- -- -- 24  LABPROT -- -- -- -- -- 13.7 12.3  INR -- -- -- -- -- 1.06 0.92  HEPARINUNFRC 0.49 -- -- 0.58 -- -- --  CREATININE 1.23 -- -- -- 1.12 1.25 --  CKTOTAL -- -- -- -- -- -- --  CKMB -- -- -- -- -- -- --  TROPONINI -- >20.00* 13.43* -- 4.70* -- --    Estimated Creatinine Clearance: 39.9 ml/min (by C-G formula based on Cr of 1.23).  Assessment: NSTEMI 79 y.o.m with persistent CP and increasing troponins yesterday. Heparin level today is therapeutic at 0.49. Hgb, plts ok--no note of bleeding. Await Echo and decision on plan of care.  Goal of Therapy:  Heparin level 0.3-0.7 units/ml Monitor platelets by anticoagulation protocol: Yes   Plan:  Continue heparin at 900 units/hr and check next HL tomorrow.  Thank you, Vertis Kelch, Pharm.D. Clinical Pharmacist   06/02/2012 7:48 AM

## 2012-06-02 NOTE — Progress Notes (Signed)
Patient and family states that pt is not allergic to ASA.

## 2012-06-02 NOTE — Progress Notes (Signed)
Pt bed alarm on pt confused getting out of bed reoriented. Pt steady on feet however reoriented pt to call for assistance when trying to get out of bed. Pt states that he will call for help

## 2012-06-03 LAB — BASIC METABOLIC PANEL
BUN: 21 mg/dL (ref 6–23)
Calcium: 8.9 mg/dL (ref 8.4–10.5)
Creatinine, Ser: 1.46 mg/dL — ABNORMAL HIGH (ref 0.50–1.35)
GFR calc Af Amer: 47 mL/min — ABNORMAL LOW (ref >90)

## 2012-06-03 LAB — HEPARIN LEVEL (UNFRACTIONATED): Heparin Unfractionated: 0.33 IU/mL (ref 0.30–0.70)

## 2012-06-03 LAB — CBC
MCH: 34.6 pg — ABNORMAL HIGH (ref 26.0–34.0)
MCHC: 34.8 g/dL (ref 30.0–36.0)
Platelets: 187 10*3/uL (ref 150–400)
RDW: 12.2 % (ref 11.5–15.5)

## 2012-06-03 MED ORDER — LOSARTAN POTASSIUM 50 MG PO TABS
50.0000 mg | ORAL_TABLET | Freq: Every day | ORAL | Status: DC
  Administered 2012-06-03: 50 mg via ORAL
  Filled 2012-06-03 (×2): qty 1

## 2012-06-03 MED ORDER — HEPARIN SODIUM (PORCINE) 5000 UNIT/ML IJ SOLN
5000.0000 [IU] | Freq: Three times a day (TID) | INTRAMUSCULAR | Status: DC
  Administered 2012-06-04 – 2012-06-05 (×4): 5000 [IU] via SUBCUTANEOUS
  Filled 2012-06-03 (×7): qty 1

## 2012-06-03 MED ORDER — HEPARIN SOD (PORCINE) IN D5W 100 UNIT/ML IV SOLN
INTRAVENOUS | Status: AC
  Filled 2012-06-03: qty 250

## 2012-06-03 NOTE — Progress Notes (Signed)
   Subjective:  Denies dyspnea; mild chest pain persists but "almost gone"   Objective:  Filed Vitals:   06/03/12 0300 06/03/12 0345 06/03/12 0439 06/03/12 0725  BP: 91/46  96/46 95/46  Pulse: 50 67 65 68  Temp:  99.5 F (37.5 C)  99.1 F (37.3 C)  TempSrc:  Axillary  Oral  Resp: 19 20 20 19   Height:      Weight:      SpO2: 94% 94% 92% 96%    Intake/Output from previous day:  Intake/Output Summary (Last 24 hours) at 06/03/12 0753 Last data filed at 06/03/12 0725  Gross per 24 hour  Intake    509 ml  Output   1200 ml  Net   -691 ml    Physical Exam: Physical exam: Well-developed well-nourished in no acute distress.  Skin is warm and dry.  HEENT is normal.  Neck is supple.  Chest is clear to auscultation with normal expansion.  Cardiovascular exam is regular rate and rhythm.  Abdominal exam nontender or distended. No masses palpated. Extremities show no edema. neuro grossly intact    Lab Results: Basic Metabolic Panel:  Basename 06/03/12 0518 06/02/12 0517  NA 132* 132*  K 3.4* 3.7  CL 95* 96  CO2 29 27  GLUCOSE 91 116*  BUN 21 18  CREATININE 1.46* 1.23  CALCIUM 8.9 9.0  MG -- --  PHOS -- --   CBC:  Basename 06/03/12 0518 06/02/12 0517 06/01/12 0229  WBC 8.6 8.9 --  NEUTROABS -- -- 3.4  HGB 13.3 13.7 --  HCT 38.2* 39.3 --  MCV 99.5 100.3* --  PLT 187 194 --   Cardiac Enzymes:  Basename 06/01/12 1955 06/01/12 1519 06/01/12 0848  CKTOTAL -- -- --  CKMB -- -- --  CKMBINDEX -- -- --  TROPONINI >20.00* 13.43* 4.70*     Assessment/Plan:  1 non-ST elevation myocardial infarction-patient has ruled in for myocardial infarction. His pain has resolved. Previous discussion with patient and family. We discussed options of continuing medical therapy versus cardiac catheterization. I explained the risks of proceeding with catheterization and also the risks of undiagnosed coronary disease. He is very clear that he would like to avoid procedures if  possible given his age. We will therefore plan medical therapy. Continue ASA, Plavix, statin and beta blocker. Discontinue heparin and NTG. We can reconsider catheterization if his symptoms worsen or he has further complications. Echo results noted. 2 hypertension-BP borderline; DC HCTZ and change cozaar to 50 mg po daily. 3 hyperlipidemia-continue statin. 4 no CODE BLUE 5 Elevated TSH; check T3 and T4.  Kirk Ruths 06/03/2012, 7:53 AM

## 2012-06-03 NOTE — Progress Notes (Signed)
Spoke with MD re: soft BP.  Held metoprolol this am.  Other meds adjusted per MAR.  Crenshaw to reevaluate in the am with rounds unless needed prior to that.    Carol Ada, RN

## 2012-06-04 LAB — CBC
MCHC: 34.4 g/dL (ref 30.0–36.0)
MCV: 99.5 fL (ref 78.0–100.0)
Platelets: 206 10*3/uL (ref 150–400)
RDW: 12.2 % (ref 11.5–15.5)
WBC: 7.4 10*3/uL (ref 4.0–10.5)

## 2012-06-04 MED ORDER — LOSARTAN POTASSIUM 25 MG PO TABS
25.0000 mg | ORAL_TABLET | Freq: Every day | ORAL | Status: DC
  Administered 2012-06-04 – 2012-06-05 (×2): 25 mg via ORAL
  Filled 2012-06-04 (×2): qty 1

## 2012-06-04 MED ORDER — METOPROLOL SUCCINATE ER 25 MG PO TB24
25.0000 mg | ORAL_TABLET | Freq: Every day | ORAL | Status: DC
  Administered 2012-06-04 – 2012-06-05 (×2): 25 mg via ORAL
  Filled 2012-06-04 (×3): qty 1

## 2012-06-04 NOTE — Progress Notes (Signed)
   Subjective:  Denies dyspnea or chest pain; general malaise   Objective:  Filed Vitals:   06/03/12 1415 06/03/12 2202 06/04/12 0457 06/04/12 0537  BP: 104/61 115/55 104/57 122/69  Pulse: 70 71 79   Temp: 97.8 F (36.6 C) 98.5 F (36.9 C) 98.5 F (36.9 C)   TempSrc: Oral Oral Oral   Resp: 17 18 17    Height:      Weight:      SpO2: 94% 98% 94%     Intake/Output from previous day:  Intake/Output Summary (Last 24 hours) at 06/04/12 0716 Last data filed at 06/04/12 0600  Gross per 24 hour  Intake 1258.5 ml  Output   1875 ml  Net -616.5 ml    Physical Exam: Physical exam: Well-developed well-nourished in no acute distress.  Skin is warm and dry.  HEENT is normal.  Neck is supple.  Chest is clear to auscultation with normal expansion.  Cardiovascular exam is regular rate and rhythm.  Abdominal exam nontender or distended. No masses palpated. Extremities show no edema. neuro grossly intact    Lab Results: Basic Metabolic Panel:  Basename 06/03/12 0518 06/02/12 0517  NA 132* 132*  K 3.4* 3.7  CL 95* 96  CO2 29 27  GLUCOSE 91 116*  BUN 21 18  CREATININE 1.46* 1.23  CALCIUM 8.9 9.0  MG -- --  PHOS -- --   CBC:  Basename 06/04/12 0555 06/03/12 0518  WBC 7.4 8.6  NEUTROABS -- --  HGB 13.5 13.3  HCT 39.3 38.2*  MCV 99.5 99.5  PLT 206 187   Cardiac Enzymes:  Basename 06/01/12 1955 06/01/12 1519 06/01/12 0848  CKTOTAL -- -- --  CKMB -- -- --  CKMBINDEX -- -- --  TROPONINI >20.00* 13.43* 4.70*     Assessment/Plan:  1 non-ST elevation myocardial infarction-patient has ruled in for myocardial infarction. His pain has resolved. Previous discussion with patient and family. We discussed options of continuing medical therapy versus cardiac catheterization. I explained the risks of proceeding with catheterization and also the risks of undiagnosed coronary disease. He is very clear that he would like to avoid procedures if possible given his age. We will  therefore plan medical therapy. Continue ASA, Plavix, statin and beta blocker. We can reconsider catheterization if his symptoms worsen or he has further complications.  2 hypertension-BP borderline; decrease toprol to 25 mg po daily; change cozaar to 25 mg po daily. 3 hyperlipidemia-continue statin. 4 no CODE BLUE 5 Elevated TSH-fu with primary care for synthroid adjustment. Ambulate today and DC in AM if stable.  Kirk Ruths 06/04/2012, 7:16 AM

## 2012-06-04 NOTE — Clinical Social Work Psychosocial (Signed)
Clinical Social Work Department BRIEF PSYCHOSOCIAL ASSESSMENT 06/04/2012  Patient:  Damon Miller, Damon Miller     Account Number:  [id number]     Admit date:  06/01/2012  Clinical Social Worker:  Layla Maw  Date/Time:  06/04/2012 04:30 PM  Referred by:  RN  Date Referred:  06/04/2012 Referred for  SNF Placement   Other Referral:   from [place] type:  Patient Other interview type:   Daughter at bedside    PSYCHOSOCIAL DATA Living Status:  FACILITY Admitted from facility:  [facility] Level of care:  Independent Living Primary support name:  Damon Miller [id number] Primary support relationship to patient:  CHILD, ADULT Degree of support available:   strong support    Daughter Damon Miller" [phone]    CURRENT CONCERNS Current Concerns  Post-Acute Placement   Other Concerns:    SOCIAL WORK ASSESSMENT / PLAN CSW was referred to Pt to assist with dc back to [employer]. Pt is from [facility] living. He is a [place] and [place], retired from [employer] and moved into [employer] in 2002. Pt is has been widowed 2 years, has 2 supportive adult children.  Pt plans to return to SNF level of care at [place] at dc. CSW contacted [facility] to coordinate possible DC tomorrow.   Assessment/plan status:  Psychosocial Support/Ongoing Assessment of Needs Other assessment/ plan:   Information/referral to community resources:    PATIENT'S/FAMILY'S RESPONSE TO PLAN OF CARE: Pt is appreciative of staff support. He is in good spirits and reports that he is doing well.   Chrys Racer, [place]

## 2012-06-05 DIAGNOSIS — Z66 Do not resuscitate: Secondary | ICD-10-CM | POA: Diagnosis present

## 2012-06-05 DIAGNOSIS — E039 Hypothyroidism, unspecified: Secondary | ICD-10-CM | POA: Diagnosis present

## 2012-06-05 DIAGNOSIS — I255 Ischemic cardiomyopathy: Secondary | ICD-10-CM | POA: Clinically undetermined

## 2012-06-05 DIAGNOSIS — E785 Hyperlipidemia, unspecified: Secondary | ICD-10-CM | POA: Diagnosis present

## 2012-06-05 DIAGNOSIS — I10 Essential (primary) hypertension: Secondary | ICD-10-CM | POA: Diagnosis present

## 2012-06-05 LAB — BASIC METABOLIC PANEL
Chloride: 99 mEq/L (ref 96–112)
Creatinine, Ser: 1.42 mg/dL — ABNORMAL HIGH (ref 0.50–1.35)
GFR calc Af Amer: 49 mL/min — ABNORMAL LOW (ref >90)
GFR calc non Af Amer: 42 mL/min — ABNORMAL LOW (ref >90)
Potassium: 3.6 mEq/L (ref 3.5–5.1)

## 2012-06-05 MED ORDER — ASPIRIN 81 MG PO TABS: 81.0000 mg | ORAL_TABLET | Freq: Every day | ORAL | Status: DC

## 2012-06-05 MED ORDER — CLOPIDOGREL BISULFATE 75 MG PO TABS: 75.0000 mg | ORAL_TABLET | Freq: Every day | ORAL | Status: DC

## 2012-06-05 MED ORDER — NITROGLYCERIN 0.4 MG SL SUBL: 0.4000 mg | SUBLINGUAL_TABLET | SUBLINGUAL | Status: AC | PRN

## 2012-06-05 MED ORDER — LOSARTAN POTASSIUM 25 MG PO TABS: 25.0000 mg | ORAL_TABLET | Freq: Every day | ORAL | Status: DC

## 2012-06-05 MED ORDER — METOPROLOL SUCCINATE ER 25 MG PO TB24: 25.0000 mg | ORAL_TABLET | Freq: Every day | ORAL | Status: DC

## 2012-06-05 NOTE — Discharge Summary (Signed)
See progress notes Brian Crenshaw  

## 2012-06-05 NOTE — Progress Notes (Signed)
Discussed discharge instructions with pt and pt's son. Pt is prepared for discharge to [facility]. Pt is stable and dressed. Will have patient ready for EMS transportation.

## 2012-06-05 NOTE — Progress Notes (Signed)
   Subjective:  Denies dyspnea or chest pain   Objective:  Filed Vitals:   06/04/12 0537 06/04/12 1307 06/04/12 2004 06/05/12 0423  BP: 122/69 127/72 129/64 110/57  Pulse:  84 69 67  Temp:  98.4 F (36.9 C) 97.2 F (36.2 C) 98.2 F (36.8 C)  TempSrc:  Oral Oral Oral  Resp:  18 18 18   Height:      Weight:      SpO2:  98% 97% 96%    Intake/Output from previous day:  Intake/Output Summary (Last 24 hours) at 06/05/12 0647 Last data filed at 06/04/12 1700  Gross per 24 hour  Intake    480 ml  Output    200 ml  Net    280 ml    Physical Exam: Physical exam: Well-developed well-nourished in no acute distress.  Skin is warm and dry.  HEENT is normal.  Neck is supple.  Chest is clear to auscultation with normal expansion.  Cardiovascular exam is regular rate and rhythm.  Abdominal exam nontender or distended. No masses palpated. Extremities show no edema. neuro grossly intact    Lab Results: Basic Metabolic Panel:  Basename 06/03/12 0518  NA 132*  K 3.4*  CL 95*  CO2 29  GLUCOSE 91  BUN 21  CREATININE 1.46*  CALCIUM 8.9  MG --  PHOS --   CBC:  Basename 06/04/12 0555 06/03/12 0518  WBC 7.4 8.6  NEUTROABS -- --  HGB 13.5 13.3  HCT 39.3 38.2*  MCV 99.5 99.5  PLT 206 187    Assessment/Plan:  1 non-ST elevation myocardial infarction-Previous discussion with patient and family. We discussed options of continuing medical therapy versus cardiac catheterization. I explained the risks of proceeding with catheterization and also the risks of undiagnosed coronary disease. He is very clear that he would like to avoid procedures if possible given his age. He has had no recurrent CP. We will therefore plan medical therapy. Continue ASA, Plavix, statin and beta blocker. We can reconsider catheterization if his symptoms worsen or he has further complications.  2 hypertension-continue present meds at DC. 3 hyperlipidemia-continue statin. 4 no CODE BLUE 5 Elevated  TSH-fu with primary care for synthroid adjustment. DC today if renal function stable (BMET pending) FU with me in 2-4 weeks >30 min PA and physician time D2  Kirk Ruths 06/05/2012, 6:47 AM

## 2012-06-05 NOTE — Clinical Social Work Note (Signed)
CSW notified that Pt is cleared for dc to [facility]. CSW prepared dc packet and sent dc information to [place] at [place].  CSW met with Pt and his son to discuss transportation home. CSW was contacted by [facility] and they will pick Pt up at 1:30. CSW updated RN who will have Pt ready for pickup.  No further CSW needs at this time.   Chrys Racer, [place]

## 2012-06-05 NOTE — Discharge Summary (Signed)
CARDIOLOGY DISCHARGE SUMMARY   Patient ID: Damon Miller MRN: [id number] DOB/AGE: [birth year] 79 y.o.  Admit date: 06/01/2012 Discharge date: 06/05/2012  Primary Discharge Diagnosis: *Acute myocardial infarction, subendocardial infarction, initial episode of care Secondary Discharge Diagnosis:   Hypertension  DNR (do not resuscitate)  Hyperlipemia  Hypothyroid Hypokalemia Chronic kidney disease stage III Ischemic cardiomyopathy  Procedures:  2D Echocardiogram  Hospital Course:  [name] is a 79 year old male with no previous history of coronary artery disease. He came to the hospital with prolonged chest pain. His ECG did not show any ST elevation but his initial troponin was elevated. He was placed on aspirin and heparin and continued on his home beta blocker and statin. He was admitted for further evaluation and treatment.  He was hypokalemic and this was supplemented and followed. His creatinine was elevated at 1.5 on admission. He was given IV fluids and hydrated. He had been on HCTZ and losartan prior to admission but these were discontinued. He had recurrent pain so was started on IV nitroglycerin in addition to the heparin. Morphine was used as needed. His pain began to improve.  Multiple discussions were held with the patient and his family. He is a DO NOT RESUSCITATE. The patient and his family were clear in their wishes not to pursue aggressive treatment and avoid cardiac catheterization, if possible, due to the risks of the procedure. If his symptoms cannot be controlled with medical therapy, cardiac catheterization can be reconsidered.  His renal function was followed closely during his hospital stay. Initially, with hydration and holding meds his BUN and creatinine improved. His Cozaar was restarted at a lower dose for better blood pressure control and because of the ischemic cardiomyopathy revealed on echocardiogram. His creatinine is trending up slightly and will be  followed closely. He reported an allergy to aspirin was started on Plavix. The situation was reviewed with the patient who could not remember any reaction to aspirin and stated he had been taking the medicine. He was started on aspirin at 81 mg daily and is tolerating it well. Because of his hypothyroidism, a TSH was checked and was elevated. His dosage was reviewed but it was elected to continue his current dose and have him followup with primary care.  He was living in a retirement community prior to admission. He was seen by social work and the decision was made for him to go to skilled nursing facility-level care at [place] on discharge. Arrangements were made.  On 06/05/2012, [name] was seen by Dr. Stanford Breed. He was not having any chest pain or shortness of breath. He was evaluated by Dr. Stanford Breed and considered stable for discharge, to follow up as an outpatient in a week for lab work and with Dr. Stanford Breed in February.  Labs:   Lab Results  Component Value Date   WBC 7.4 06/04/2012   HGB 13.5 06/04/2012   HCT 39.3 06/04/2012   MCV 99.5 06/04/2012   PLT 206 06/04/2012     Lab 06/05/12 0515  NA 137  K 3.6  CL 99  CO2 27  BUN 29*  CREATININE 1.42*  CALCIUM 8.6  PROT --  BILITOT --  ALKPHOS --  ALT --  AST --  GLUCOSE 94   Lab Results  Component Value Date   TROPONINI >20.00* 06/01/2012    Lipid Panel     Component Value Date/Time   CHOL 251* 06/01/2012 0425   TRIG 63 06/01/2012 0425   HDL 61 06/01/2012 0425  CHOLHDL 4.1 06/01/2012 0425   VLDL 13 06/01/2012 0425   LDLCALC 177* 06/01/2012 0425    Radiology: Dg Chest Port 1 View 06/01/2012  *RADIOLOGY REPORT*  Clinical Data: Chest pain and emesis.  PORTABLE CHEST - 1 VIEW  Comparison: None.  Findings: Shallow inspiration.  Borderline heart size with normal pulmonary vascularity.  No focal airspace consolidation in the lungs.  No blunting of costophrenic angles.  No pneumothorax. Mediastinal contours appear intact.   IMPRESSION: Mild cardiac enlargement.  No evidence of active pulmonary disease.   Original Report Authenticated By: Lucienne Capers, M.D.    EKG:  02-Jun-2012 08:06:14 [facility] ROUTINE RECORD Sinus rhythm with Premature atrial complexes Possible Anterior infarct , age undetermined T wave abnormality, consider lateral ischemia evolving since previous Abnormal ECG 41mm/s 51mm/mV 100Hz  8.0.1 12SL 241 HD CID: 1 Referred by: Confirmed By: Kirk Ruths MD Vent. rate 62 BPM PR interval 154 ms QRS duration 82 ms QT/QTc 450/456 ms P-R-T axes 67 70 115  Echo: 06/02/2012  Study Conclusions - Left ventricle: The cavity size was normal. Wall thickness was normal. Systolic function was mildly to moderately reduced. The estimated ejection fraction was in the range of 40% to 45%. There is hypokinesis of the mid-distal posterolateral and apical myocardium. Doppler parameters are consistent with abnormal left ventricular relaxation (grade 1 diastolic dysfunction). - Aortic valve: Mild regurgitation. - Mitral valve: Calcified annulus. - Pulmonary arteries: PA peak pressure: 51mm Hg (S).    FOLLOW UP PLANS AND APPOINTMENTS Allergies  Allergen Reactions  . Ibuprofen Rash     Medication List     As of 06/05/2012  9:31 AM    STOP taking these medications         hydrochlorothiazide 25 MG tablet   Commonly known as: HYDRODIURIL      metoprolol 50 MG tablet   Commonly known as: LOPRESSOR      TAKE these medications         acetaminophen 325 MG tablet   Commonly known as: TYLENOL   Take 650 mg by mouth every 6 (six) hours as needed. For pain      allopurinol 100 MG tablet   Commonly known as: ZYLOPRIM   Take 100 mg by mouth daily.      aspirin 81 MG tablet   Take 1 tablet (81 mg total) by mouth daily.      atorvastatin 20 MG tablet   Commonly known as: LIPITOR   Take 20 mg by mouth daily.      cholecalciferol 1000 UNITS tablet   Commonly known as:  VITAMIN D   Take 2,000 Units by mouth daily.      clopidogrel 75 MG tablet   Commonly known as: PLAVIX   Take 1 tablet (75 mg total) by mouth daily.      latanoprost 0.005 % ophthalmic solution   Commonly known as: XALATAN   Place 1 drop into the right eye at bedtime.      levothyroxine 50 MCG tablet   Commonly known as: SYNTHROID, LEVOTHROID   Take 50 mcg by mouth daily.      losartan 25 MG tablet   Commonly known as: COZAAR   Take 1 tablet (25 mg total) by mouth daily.      metoprolol succinate 25 MG 24 hr tablet   Commonly known as: TOPROL-XL   Take 1 tablet (25 mg total) by mouth daily.      nitroGLYCERIN 0.4 MG SL tablet   Commonly  known as: NITROSTAT   Place 1 tablet (0.4 mg total) under the tongue every 5 (five) minutes as needed for chest pain.          Discharge Orders    Future Appointments: Provider: Department: Dept Phone: Center:   06/14/2012 1:00 PM [facility] [place]) [phone] [facility]   07/11/2012 2:00 PM Lelon Perla, MD [place] [facility]) [phone] [facility]     Future Orders Please Complete By Expires   Diet - low sodium heart healthy      Increase activity slowly        Follow-up Information    Follow up with [facility]. On 06/14/2012. (Lab work at 1:00 pm, do not have to be fasting, OK to take medications)    Contact information:   [place] [phone]       Follow up with Kirk Ruths, MD. On 07/11/2012. (at 2:00 pm)    Contact information:   [address], [address]                         [address] [place] [address] [phone]          BRING ALL MEDICATIONS WITH YOU TO FOLLOW UP APPOINTMENTS  Time spent with patient to include physician time: 38 min Signed: Rosaria Ferries 06/05/2012, 9:31 AM Co-Sign MD

## 2012-06-06 ENCOUNTER — Telehealth: Payer: Self-pay | Admitting: *Deleted

## 2012-06-06 ENCOUNTER — Telehealth: Payer: Self-pay | Admitting: Cardiology

## 2012-06-06 NOTE — Telephone Encounter (Signed)
New problem:  Fax # [phone] .   Since patient is schedule for lab work  On  1/31. [facility] is asking can they  draw labs &  fax results over. If so,  please fax order over .

## 2012-06-06 NOTE — Telephone Encounter (Signed)
Unable to leave msg, no voice mail set up, will continue to attempt contact.

## 2012-06-06 NOTE — Telephone Encounter (Signed)
Will fax order for bmp to number provided.

## 2012-06-07 NOTE — Telephone Encounter (Signed)
**Note De-Identified  Obfuscation** No answer and no way to leave message.

## 2012-06-13 ENCOUNTER — Encounter: Payer: Self-pay | Admitting: Cardiology

## 2012-06-14 ENCOUNTER — Other Ambulatory Visit: Payer: Self-pay | Admitting: *Deleted

## 2012-06-14 ENCOUNTER — Other Ambulatory Visit: Payer: Medicare Other

## 2012-06-14 DIAGNOSIS — N189 Chronic kidney disease, unspecified: Secondary | ICD-10-CM

## 2012-07-10 ENCOUNTER — Encounter: Payer: Self-pay | Admitting: Cardiology

## 2012-07-11 ENCOUNTER — Encounter: Payer: Self-pay | Admitting: Cardiology

## 2012-07-11 ENCOUNTER — Ambulatory Visit (INDEPENDENT_AMBULATORY_CARE_PROVIDER_SITE_OTHER): Payer: Medicare Other | Admitting: Cardiology

## 2012-07-11 VITALS — BP 138/80 | HR 70 | Wt 169.0 lb

## 2012-07-11 DIAGNOSIS — I2510 Atherosclerotic heart disease of native coronary artery without angina pectoris: Secondary | ICD-10-CM

## 2012-07-11 DIAGNOSIS — I10 Essential (primary) hypertension: Secondary | ICD-10-CM

## 2012-07-11 DIAGNOSIS — E785 Hyperlipidemia, unspecified: Secondary | ICD-10-CM

## 2012-07-11 DIAGNOSIS — I2589 Other forms of chronic ischemic heart disease: Secondary | ICD-10-CM

## 2012-07-11 DIAGNOSIS — I255 Ischemic cardiomyopathy: Secondary | ICD-10-CM

## 2012-07-11 MED ORDER — ASPIRIN EC 81 MG PO TBEC: 81.0000 mg | DELAYED_RELEASE_TABLET | Freq: Every day | ORAL | Status: DC

## 2012-07-11 NOTE — Assessment & Plan Note (Signed)
Blood pressure controlled.continue present medications. 

## 2012-07-11 NOTE — Assessment & Plan Note (Signed)
Continue beta blocker, Plavix and statin. Add low-dose aspirin. We will discontinue Plavix one year from his infarct. He continues to wish only medical therapy. We would consider catheterization in the future if he has refractory symptoms.

## 2012-07-11 NOTE — Patient Instructions (Addendum)
Your physician wants you to follow-up in: [place] will receive a reminder letter in the mail two months in advance. If you don't receive a letter, please call our office to schedule the follow-up appointment.   START ASPIRIN 81 MG ONCE DAILY WITH FOOD

## 2012-07-11 NOTE — Assessment & Plan Note (Signed)
Continue statin. 

## 2012-07-11 NOTE — Progress Notes (Signed)
HPI: 79 year old male for followup of coronary artery disease. The patient admitted to [facility] in January of 2014 with chest pain. He ruled in for myocardial infarction. Discussions with patient and family concerning aggressiveness of care; patient elected medical therapy with no procedures. Note he is a no CODE BLUE. Echocardiogram in January of 2014 showed an ejection fraction of 40-45% with hypokinesis of the mid and distal posterior lateral and apical myocardium. There was mild aortic insufficiency. The patient's TSH was mildly elevated during that admission and he was asked to followup with primary care. Since discharge, the patient denies any dyspnea on exertion, orthopnea, PND, pedal edema, palpitations, syncope or chest pain.   Current Outpatient Prescriptions  Medication Sig Dispense Refill  . acetaminophen (TYLENOL) 325 MG tablet Take 650 mg by mouth every 6 (six) hours as needed. For pain      . allopurinol (ZYLOPRIM) 100 MG tablet Take 100 mg by mouth daily.      Marland Kitchen atorvastatin (LIPITOR) 20 MG tablet Take 20 mg by mouth daily.      . cholecalciferol (VITAMIN D) 1000 UNITS tablet Take 2,000 Units by mouth daily.      . clopidogrel (PLAVIX) 75 MG tablet Take 1 tablet (75 mg total) by mouth daily.  30 tablet  11  . latanoprost (XALATAN) 0.005 % ophthalmic solution Place 1 drop into the right eye at bedtime.      Marland Kitchen levothyroxine (SYNTHROID, LEVOTHROID) 50 MCG tablet Take 50 mcg by mouth daily.      Marland Kitchen losartan (COZAAR) 25 MG tablet Take 1 tablet (25 mg total) by mouth daily.  30 tablet  11  . metoprolol succinate (TOPROL XL) 25 MG 24 hr tablet Take 1 tablet (25 mg total) by mouth daily.  30 tablet  11  . nitroGLYCERIN (NITROSTAT) 0.4 MG SL tablet Place 1 tablet (0.4 mg total) under the tongue every 5 (five) minutes as needed for chest pain.  25 tablet  3   No current facility-administered medications for this visit.     Past Medical History  Diagnosis Date  . Hyperlipemia    . Diverticulosis   . Prostate cancer   . Hypothyroid   . Vitamin D deficiency disease   . Cataracts, bilateral   . GERD (gastroesophageal reflux disease)   . Arthritis   . Hypertension   . DNR (do not resuscitate)   . CAD (coronary artery disease)     Past Surgical History  Procedure Laterality Date  . Colonoscopy      History   Social History  . Marital Status: Widowed    Spouse Name: N/A    Number of Children: N/A  . Years of Education: N/A   Occupational History  . Not on file.   Social History Main Topics  . Smoking status: Former Research scientist (life sciences)  . Smokeless tobacco: Not on file  . Alcohol Use: No  . Drug Use: No  . Sexually Active:    Other Topics Concern  . Not on file   Social History Narrative  . No narrative on file    ROS: no fevers or chills, productive cough, hemoptysis, dysphasia, odynophagia, melena, hematochezia, dysuria, hematuria, rash, seizure activity, orthopnea, PND, pedal edema, claudication. Remaining systems are negative.  Physical Exam: Well-developed well-nourished in no acute distress.  Skin is warm and dry.  HEENT is normal.  Neck is supple.  Chest is clear to auscultation with normal expansion.  Cardiovascular exam is regular rate and rhythm.  Abdominal  exam nontender or distended. No masses palpated. Extremities show no edema. neuro grossly intact

## 2012-07-11 NOTE — Assessment & Plan Note (Addendum)
Continue ARB and beta blocker. 

## 2012-08-19 ENCOUNTER — Non-Acute Institutional Stay: Payer: Medicare Other | Admitting: Internal Medicine

## 2012-08-19 VITALS — BP 156/62 | HR 67 | Temp 97.9°F | Resp 14 | Wt 171.0 lb

## 2012-08-19 DIAGNOSIS — I2510 Atherosclerotic heart disease of native coronary artery without angina pectoris: Secondary | ICD-10-CM

## 2012-08-19 DIAGNOSIS — K219 Gastro-esophageal reflux disease without esophagitis: Secondary | ICD-10-CM

## 2012-08-19 DIAGNOSIS — M109 Gout, unspecified: Secondary | ICD-10-CM

## 2012-08-19 DIAGNOSIS — I2589 Other forms of chronic ischemic heart disease: Secondary | ICD-10-CM

## 2012-08-19 DIAGNOSIS — I255 Ischemic cardiomyopathy: Secondary | ICD-10-CM

## 2012-08-19 DIAGNOSIS — C61 Malignant neoplasm of prostate: Secondary | ICD-10-CM

## 2012-08-19 DIAGNOSIS — N183 Chronic kidney disease, stage 3 unspecified: Secondary | ICD-10-CM

## 2012-08-19 DIAGNOSIS — R7309 Other abnormal glucose: Secondary | ICD-10-CM

## 2012-08-19 DIAGNOSIS — G3184 Mild cognitive impairment, so stated: Secondary | ICD-10-CM

## 2012-08-19 DIAGNOSIS — R739 Hyperglycemia, unspecified: Secondary | ICD-10-CM

## 2012-08-19 DIAGNOSIS — E785 Hyperlipidemia, unspecified: Secondary | ICD-10-CM

## 2012-08-19 DIAGNOSIS — E039 Hypothyroidism, unspecified: Secondary | ICD-10-CM

## 2012-08-19 DIAGNOSIS — I10 Essential (primary) hypertension: Secondary | ICD-10-CM

## 2012-10-17 ENCOUNTER — Other Ambulatory Visit: Payer: Self-pay | Admitting: *Deleted

## 2012-10-17 MED ORDER — ATORVASTATIN CALCIUM 20 MG PO TABS: ORAL_TABLET | ORAL | Status: DC

## 2012-11-18 ENCOUNTER — Non-Acute Institutional Stay: Payer: Medicare Other | Admitting: Internal Medicine

## 2012-11-18 ENCOUNTER — Encounter: Payer: Self-pay | Admitting: Internal Medicine

## 2012-11-18 VITALS — BP 142/72 | HR 64 | Ht 69.0 in | Wt 169.0 lb

## 2012-11-18 DIAGNOSIS — C44519 Basal cell carcinoma of skin of other part of trunk: Secondary | ICD-10-CM

## 2012-11-18 DIAGNOSIS — Z66 Do not resuscitate: Secondary | ICD-10-CM

## 2012-11-18 DIAGNOSIS — I10 Essential (primary) hypertension: Secondary | ICD-10-CM

## 2012-11-18 DIAGNOSIS — N183 Chronic kidney disease, stage 3 unspecified: Secondary | ICD-10-CM

## 2012-11-18 DIAGNOSIS — I2510 Atherosclerotic heart disease of native coronary artery without angina pectoris: Secondary | ICD-10-CM

## 2012-11-18 DIAGNOSIS — E039 Hypothyroidism, unspecified: Secondary | ICD-10-CM

## 2012-11-18 DIAGNOSIS — E785 Hyperlipidemia, unspecified: Secondary | ICD-10-CM

## 2012-11-18 NOTE — Progress Notes (Signed)
Subjective:    Patient ID: Damon Miller, male    DOB: 23-Apr-1920, 79 y.o.   MRN: [id number]  HPI   Basal cell carcinoma of chest wall  New 3 mm lesion at manubrium that looks like a basal cell cancer.  CAD (coronary artery disease): Status post subendocardial myocardial infarction 06/01/12  CKD (chronic kidney disease) stage 3, GFR 30-59 ml/min: Stable  Hypothyroid: Unchanged  Hyperlipemia: Stable; no change in medications  DNR (do not resuscitate): Reason for  Hypertension: Stable  Memory deficit: Mild. Functions well socially.  Current Outpatient Prescriptions on File Prior to Visit  Medication Sig Dispense Refill  . acetaminophen (TYLENOL) 325 MG tablet Take 650 mg by mouth every 6 (six) hours as needed. For pain      . allopurinol (ZYLOPRIM) 100 MG tablet Take 100 mg by mouth daily.      Marland Kitchen aspirin EC 81 MG tablet Take 1 tablet (81 mg total) by mouth daily.  90 tablet  3  . atorvastatin (LIPITOR) 20 MG tablet Take one tablet by mouth once daily for cholesterol  30 tablet  6  . cholecalciferol (VITAMIN D) 1000 UNITS tablet Take 2,000 Units by mouth daily.      . clopidogrel (PLAVIX) 75 MG tablet Take 1 tablet (75 mg total) by mouth daily.  30 tablet  11  . latanoprost (XALATAN) 0.005 % ophthalmic solution Place 1 drop into the right eye at bedtime.      Marland Kitchen levothyroxine (SYNTHROID, LEVOTHROID) 50 MCG tablet Take 50 mcg by mouth daily.      Marland Kitchen losartan (COZAAR) 25 MG tablet Take 1 tablet (25 mg total) by mouth daily.  30 tablet  11  . metoprolol succinate (TOPROL XL) 25 MG 24 hr tablet Take 1 tablet (25 mg total) by mouth daily.  30 tablet  11  . nitroGLYCERIN (NITROSTAT) 0.4 MG SL tablet Place 1 tablet (0.4 mg total) under the tongue every 5 (five) minutes as needed for chest pain.  25 tablet  3   No current facility-administered medications on file prior to visit.    Review of Systems  Constitutional: Negative.   HENT: Negative.   Eyes: Positive for visual disturbance.   Respiratory: Negative for cough, choking, chest tightness and shortness of breath.   Cardiovascular: Negative for chest pain, palpitations and leg swelling.       MI 06/01/12.  Gastrointestinal: Negative.   Endocrine: Negative.   Genitourinary: Negative.   Musculoskeletal: Negative.   Skin: Negative.   Neurological:       Mild memory deficit.  Hematological: Negative.   Psychiatric/Behavioral: Negative.        Objective:BP 142/72  Pulse 64  Ht 5\' 9"  (1.753 m)  Wt 169 lb (76.658 kg)  BMI 24.95 kg/m2    Physical Exam  Constitutional: He is oriented to person, place, and time. He appears well-developed and well-nourished. No distress.  HENT:  Head: Normocephalic and atraumatic.  Right Ear: External ear normal.  Left Ear: External ear normal.  Nose: Nose normal.  Desaf.  Eyes:  Corrective lenses.  Neck: Neck supple. No JVD present. No tracheal deviation present. No thyromegaly present.  Cardiovascular: Normal rate, regular rhythm, normal heart sounds and intact distal pulses.  Exam reveals no gallop and no friction rub.   No murmur heard. Pulmonary/Chest: Breath sounds normal. No respiratory distress. He has no wheezes. He has no rales. He exhibits no tenderness.  Abdominal: Bowel sounds are normal. He exhibits no distension and no mass.  There is no tenderness.  Musculoskeletal: Normal range of motion. He exhibits no edema and no tenderness.  Lymphadenopathy:    He has no cervical adenopathy.  Neurological: He is alert and oriented to person, place, and time. No cranial nerve deficit. Coordination normal.  Skin: No rash noted. No erythema. No pallor.  Psychiatric: He has a normal mood and affect. His behavior is normal. Judgment and thought content normal.     09/27/11 MMSE: total 23/30, passed clock draw 11/18/12 MMSE total 24/30, passed clock draw     Assessment & Plan:  Basal cell carcinoma of chest wall; refer to dermatologist  CAD (coronary artery disease): STEMI in  jan 2014. Has reconvered. No angina.  CKD (chronic kidney disease) stage 3, GFR 30-59 ml/min  Hypothyroid; stable  Hyperlipemia: stable  DNR (do not resuscitate): reconfirmed  Hypertension: controlled

## 2012-11-18 NOTE — Progress Notes (Signed)
Passed clock drawing 

## 2012-11-30 ENCOUNTER — Encounter: Payer: Self-pay | Admitting: Internal Medicine

## 2012-11-30 DIAGNOSIS — K219 Gastro-esophageal reflux disease without esophagitis: Secondary | ICD-10-CM | POA: Insufficient documentation

## 2012-11-30 DIAGNOSIS — R739 Hyperglycemia, unspecified: Secondary | ICD-10-CM | POA: Insufficient documentation

## 2012-11-30 DIAGNOSIS — M109 Gout, unspecified: Secondary | ICD-10-CM | POA: Insufficient documentation

## 2012-11-30 DIAGNOSIS — G3184 Mild cognitive impairment, so stated: Secondary | ICD-10-CM | POA: Insufficient documentation

## 2012-11-30 DIAGNOSIS — C61 Malignant neoplasm of prostate: Secondary | ICD-10-CM | POA: Insufficient documentation

## 2012-11-30 NOTE — Progress Notes (Signed)
Subjective:    Patient ID: Damon Miller, male    DOB: [birth year], 79 y.o.   MRN: [id number]  HPI Routine 6 month visit for independent [facility] resident.  Hyperlipemia: under excellent control  Hypertension: Mild systolic blood pressure elevations  CKD (chronic kidney disease) stage 3, GFR 30-59 ml/min: Unchanged  Mild cognitive impairment, so stated: Patient understands that his memory is not as sharp as previously was. It is not creating any difficulties in daily living.  Cardiomyopathy, ischemic: Chronic and stable  CAD (coronary artery disease): History of non-ST elevation MI January 2014. Has made excellent recovery. He denies any angina or palpitations. There is some dyspnea with exertion but it is mild.  Hypothyroid: Stable on thyroid supplements.  Current Outpatient Prescriptions on File Prior to Visit  Medication Sig Dispense Refill  . acetaminophen (TYLENOL) 325 MG tablet Take 650 mg by mouth every 6 (six) hours as needed. For pain      . allopurinol (ZYLOPRIM) 100 MG tablet Take 100 mg by mouth daily.      Marland Kitchen aspirin EC 81 MG tablet Take 1 tablet (81 mg total) by mouth daily.  90 tablet  3  . cholecalciferol (VITAMIN D) 1000 UNITS tablet Take 2,000 Units by mouth daily.      . clopidogrel (PLAVIX) 75 MG tablet Take 1 tablet (75 mg total) by mouth daily.  30 tablet  11  . latanoprost (XALATAN) 0.005 % ophthalmic solution Place 1 drop into the right eye at bedtime.      Marland Kitchen levothyroxine (SYNTHROID, LEVOTHROID) 50 MCG tablet Take 50 mcg by mouth daily.      Marland Kitchen losartan (COZAAR) 25 MG tablet Take 1 tablet (25 mg total) by mouth daily.  30 tablet  11  . metoprolol succinate (TOPROL XL) 25 MG 24 hr tablet Take 1 tablet (25 mg total) by mouth daily.  30 tablet  11  . nitroGLYCERIN (NITROSTAT) 0.4 MG SL tablet Place 1 tablet (0.4 mg total) under the tongue every 5 (five) minutes as needed for chest pain.  25 tablet  3      Review of Systems  Constitutional: Negative.   HENT:  Negative.   Eyes: Negative.   Respiratory: Negative.   Cardiovascular: Negative for chest pain, palpitations and leg swelling.       History myocardial infarction and coronary artery disease. Ischemic cardiomyopathy controlled.  Gastrointestinal: Negative.   Endocrine:       History of hypothyroidism and  Genitourinary: Negative.   Musculoskeletal: Positive for gait problem. Negative for myalgias, back pain and joint swelling.       Mild balance abnormalities.  Skin:       Previous perianal rash has resolved.  Neurological:       Mild memory loss.  Hematological: Negative.   Psychiatric/Behavioral: Negative.        Objective:BP 156/62  Pulse 67  Temp(Src) 97.9 F (36.6 C)  Resp 14  Wt 171 lb (77.565 kg)  BMI 25.24 kg/m2  SpO2 95%    Physical Exam  Constitutional: He is oriented to person, place, and time. He appears well-developed and well-nourished. No distress.  HENT:  Head: Normocephalic and atraumatic.  Nose: Nose normal.  Partial loss of hearing.  Eyes:  Corrective lenses.  Neck: Normal range of motion. Neck supple. No JVD present. No tracheal deviation present. No thyromegaly present.  Cardiovascular: Normal rate, regular rhythm and normal heart sounds.  Exam reveals no gallop and no friction rub.   No murmur heard.  Pulmonary/Chest: Effort normal and breath sounds normal.  Abdominal: Soft. Bowel sounds are normal. He exhibits no distension and no mass. There is no tenderness.  Musculoskeletal: Normal range of motion. He exhibits no edema and no tenderness.  Lymphadenopathy:    He has no cervical adenopathy.  Neurological: He is alert and oriented to person, place, and time. He has normal reflexes. Coordination normal.  Skin: No rash noted. No erythema. No pallor.  Psychiatric: He has a normal mood and affect. His behavior is normal. Judgment and thought content normal.   Lab Reports  08/13/2012 BMP: Sodium 133, potassium 4.1, BUN 26, creatinine 1.42   Lipids:  TC 163, trig 129, HDL 51, LDL 86  TSH 4.172  A1C. 5.9      Assessment & Plan:  Hyperlipemia: Controlled on atorvastatin  Hypertension: Mild systolic blood pressure elevation today. Pressures are better. No change in medications.  CKD (chronic kidney disease) stage 3, GFR 30-59 ml/min: Stable  Mild cognitive impairment, so stated: Unmedicated. Continue to follow with MMSE.  Cardiomyopathy, ischemic: Unchanged and stable  CAD (coronary artery disease): Has recovered well from myocardial infarction January 2014  Hypothyroid: Stable on supplements  Prostate cancer: No signs of recurrence  GERD (gastroesophageal reflux disease): Asymptomatic  Gout, unspecified: Several months since last. Currently asymptomatic  Hyperglycemia: Mild. Continue to follow lab. Unmedicated.

## 2012-11-30 NOTE — Patient Instructions (Signed)
Continue current medications. 

## 2012-12-16 ENCOUNTER — Encounter: Payer: Self-pay | Admitting: Internal Medicine

## 2012-12-20 ENCOUNTER — Other Ambulatory Visit: Payer: Self-pay | Admitting: Physician Assistant

## 2012-12-30 NOTE — Patient Instructions (Signed)
Continue current medications. 

## 2013-02-17 ENCOUNTER — Other Ambulatory Visit: Payer: Self-pay | Admitting: Internal Medicine

## 2013-03-17 ENCOUNTER — Encounter: Payer: Self-pay | Admitting: Internal Medicine

## 2013-03-17 ENCOUNTER — Non-Acute Institutional Stay: Payer: Medicare Other | Admitting: Internal Medicine

## 2013-03-17 VITALS — BP 140/66 | HR 72 | Ht 69.0 in | Wt 168.0 lb

## 2013-03-17 DIAGNOSIS — R7309 Other abnormal glucose: Secondary | ICD-10-CM

## 2013-03-17 DIAGNOSIS — E039 Hypothyroidism, unspecified: Secondary | ICD-10-CM

## 2013-03-17 DIAGNOSIS — I10 Essential (primary) hypertension: Secondary | ICD-10-CM

## 2013-03-17 DIAGNOSIS — R413 Other amnesia: Secondary | ICD-10-CM

## 2013-03-17 DIAGNOSIS — E785 Hyperlipidemia, unspecified: Secondary | ICD-10-CM

## 2013-03-17 DIAGNOSIS — N183 Chronic kidney disease, stage 3 unspecified: Secondary | ICD-10-CM

## 2013-03-17 DIAGNOSIS — C44519 Basal cell carcinoma of skin of other part of trunk: Secondary | ICD-10-CM

## 2013-03-17 DIAGNOSIS — R739 Hyperglycemia, unspecified: Secondary | ICD-10-CM

## 2013-03-17 NOTE — Patient Instructions (Signed)
Continue medications as listed 

## 2013-03-17 NOTE — Progress Notes (Signed)
Subjective:    Patient ID: Damon Miller, male    DOB: 1919-08-13, 79 y.o.   MRN: [id number]  Chief Complaint  Patient presents with  . Medical Managment of Chronic Issues    blood pressure, CAD, thyroid.  With son Damon Miller today    HPI Hypertension: controlled  Hypothyroid: controlled  Basal cell carcinoma of chest wall: removed by Derm. Has residual lump of scar tissue at the upper sternum.  CKD (chronic kidney disease) stage 3, GFR 30-59 ml/min:stable  Hyperlipemia: controlled  Hyperglycemia; glu 88 on most recent lab  Memory loss:lives in main building. Gets medication supervision. Has assistance 10-12 noon and 6-7 PM.    Current Outpatient Prescriptions on File Prior to Visit  Medication Sig Dispense Refill  . acetaminophen (TYLENOL) 325 MG tablet Take 650 mg by mouth every 6 (six) hours as needed. For pain      . allopurinol (ZYLOPRIM) 100 MG tablet Take 100 mg by mouth daily.      Marland Kitchen aspirin EC 81 MG tablet Take 1 tablet (81 mg total) by mouth daily.  90 tablet  3  . atorvastatin (LIPITOR) 20 MG tablet Take one tablet by mouth once daily for cholesterol  30 tablet  6  . cholecalciferol (VITAMIN D) 1000 UNITS tablet Take 2,000 Units by mouth daily.      . clopidogrel (PLAVIX) 75 MG tablet Take 1 tablet (75 mg total) by mouth daily.  30 tablet  11  . latanoprost (XALATAN) 0.005 % ophthalmic solution Place 1 drop into the right eye at bedtime.      Marland Kitchen levothyroxine (SYNTHROID, LEVOTHROID) 50 MCG tablet TAKE 1 TABLET BY MOUTH ONCE DAILY FOR THROYID SUPPLEMENT  90 tablet  0  . losartan (COZAAR) 25 MG tablet Take 1 tablet (25 mg total) by mouth daily.  30 tablet  11  . metoprolol succinate (TOPROL XL) 25 MG 24 hr tablet Take 1 tablet (25 mg total) by mouth daily.  30 tablet  11  . nitroGLYCERIN (NITROSTAT) 0.4 MG SL tablet Place 1 tablet (0.4 mg total) under the tongue every 5 (five) minutes as needed for chest pain.  25 tablet  3   No current facility-administered medications on  file prior to visit.    Review of Systems  Constitutional: Negative.   HENT: Negative.   Eyes: Positive for visual disturbance.  Respiratory: Negative for cough, choking, chest tightness and shortness of breath.   Cardiovascular: Negative for chest pain, palpitations and leg swelling.       MI 06/01/12.  Gastrointestinal: Negative.   Endocrine: Negative.   Genitourinary: Negative.   Musculoskeletal: Negative.   Skin: Negative.   Neurological:       Mild memory deficit.  Hematological: Negative.   Psychiatric/Behavioral: Negative.        Objective:BP 140/66  Pulse 72  Ht 5\' 9"  (1.753 m)  Wt 168 lb (76.204 kg)  BMI 24.80 kg/m2    Physical Exam  Constitutional: He is oriented to person, place, and time. He appears well-developed and well-nourished. No distress.  HENT:  Head: Normocephalic and atraumatic.  Nose: Nose normal.  Partial loss of hearing.  Eyes:  Corrective lenses.  Neck: Normal range of motion. Neck supple. No JVD present. No tracheal deviation present. No thyromegaly present.  Cardiovascular: Normal rate, regular rhythm and normal heart sounds.  Exam reveals no gallop and no friction rub.   No murmur heard. Pulmonary/Chest: Effort normal and breath sounds normal.  Abdominal: Soft. Bowel sounds are normal.  He exhibits no distension and no mass. There is no tenderness.  Musculoskeletal: Normal range of motion. He exhibits no edema and no tenderness.  Lymphadenopathy:    He has no cervical adenopathy.  Neurological: He is alert and oriented to person, place, and time. He has normal reflexes. Coordination normal.  Skin: No rash noted. No erythema. No pallor.  Psychiatric: He has a normal mood and affect. His behavior is normal. Judgment and thought content normal.     LAB REVIEW 03/11/13 BMP: glu 88, BUN 25, creat 1.41  Lipids; TC 173, trig 112, HDL 51, LDL 100  TSH 3.929     Assessment & Plan:  Hypertension: controlled  Hypothyroid: controlled  Basal  cell carcinoma of chest wall: removed  CKD (chronic kidney disease) stage 3, GFR 30-59 ml/min: stable  Hyperlipemia:controlled  Hyperglycemia: most recent lab is normal  Memory loss: discussed whether medication is indicated with patient and son. I think the dominant issue is vascular dementia, although I cannot say for sure whether Alzheimer's is an issue too. No new Rx today.

## 2013-04-04 ENCOUNTER — Encounter: Payer: Self-pay | Admitting: Internal Medicine

## 2013-04-28 ENCOUNTER — Other Ambulatory Visit: Payer: Self-pay | Admitting: Nurse Practitioner

## 2013-05-26 ENCOUNTER — Other Ambulatory Visit: Payer: Self-pay | Admitting: Internal Medicine

## 2013-06-27 ENCOUNTER — Other Ambulatory Visit: Payer: Self-pay | Admitting: Geriatric Medicine

## 2013-07-07 ENCOUNTER — Other Ambulatory Visit: Payer: Self-pay | Admitting: Geriatric Medicine

## 2013-07-09 NOTE — Telephone Encounter (Signed)
I called patient's son to verify if patient is taking Plavix, not on medication list. Patient's son was unaware of his fathers medications and referred me to [facility] assisted [facility] @ [phone].  I called [facility] and spoke with Clarice and she confirmed patient is taking medication but refill is not needed for patient gets his RX's through the [facility]

## 2013-09-09 LAB — BASIC METABOLIC PANEL
BUN: 30 mg/dL — AB (ref 4–21)
Creatinine: 1.4 mg/dL — AB (ref 0.6–1.3)
Glucose: 83 mg/dL
Potassium: 4.6 mmol/L (ref 3.4–5.3)
SODIUM: 141 mmol/L (ref 137–147)

## 2013-09-09 LAB — LIPID PANEL
Cholesterol: 138 mg/dL (ref 0–200)
HDL: 52 mg/dL (ref 35–70)
LDL Cholesterol: 73 mg/dL
LDl/HDL Ratio: 2.7
Triglycerides: 66 mg/dL (ref 40–160)

## 2013-09-09 LAB — TSH: TSH: 2.66 u[IU]/mL (ref 0.41–5.90)

## 2013-09-09 LAB — HEPATIC FUNCTION PANEL
ALT: 16 U/L (ref 10–40)
AST: 19 U/L (ref 14–40)
Alkaline Phosphatase: 63 U/L (ref 25–125)
Bilirubin, Total: 0.4 mg/dL

## 2013-09-15 ENCOUNTER — Non-Acute Institutional Stay: Payer: Medicare Other | Admitting: Internal Medicine

## 2013-09-15 ENCOUNTER — Encounter: Payer: Self-pay | Admitting: Internal Medicine

## 2013-09-15 VITALS — BP 128/72 | HR 72 | Wt 178.0 lb

## 2013-09-15 DIAGNOSIS — I2589 Other forms of chronic ischemic heart disease: Secondary | ICD-10-CM

## 2013-09-15 DIAGNOSIS — N183 Chronic kidney disease, stage 3 unspecified: Secondary | ICD-10-CM

## 2013-09-15 DIAGNOSIS — R7309 Other abnormal glucose: Secondary | ICD-10-CM

## 2013-09-15 DIAGNOSIS — E785 Hyperlipidemia, unspecified: Secondary | ICD-10-CM

## 2013-09-15 DIAGNOSIS — C44519 Basal cell carcinoma of skin of other part of trunk: Secondary | ICD-10-CM

## 2013-09-15 DIAGNOSIS — K219 Gastro-esophageal reflux disease without esophagitis: Secondary | ICD-10-CM

## 2013-09-15 DIAGNOSIS — I255 Ischemic cardiomyopathy: Secondary | ICD-10-CM

## 2013-09-15 DIAGNOSIS — R413 Other amnesia: Secondary | ICD-10-CM

## 2013-09-15 DIAGNOSIS — I10 Essential (primary) hypertension: Secondary | ICD-10-CM

## 2013-09-15 DIAGNOSIS — I2510 Atherosclerotic heart disease of native coronary artery without angina pectoris: Secondary | ICD-10-CM

## 2013-09-15 DIAGNOSIS — R739 Hyperglycemia, unspecified: Secondary | ICD-10-CM

## 2013-09-15 DIAGNOSIS — E039 Hypothyroidism, unspecified: Secondary | ICD-10-CM

## 2013-09-15 NOTE — Progress Notes (Signed)
Patient ID: Damon Miller, male   DOB: Sep 18, 1919, 80 y.o.   MRN: [id number]    Location:  [facility]  Place of Service: CLINIC    Allergies  Allergen Reactions  . Ibuprofen Rash    Chief Complaint  Patient presents with  . Medical Management of Chronic Issues    blood pressure, thyroid, hyperglycemia, memory, CKD    HPI:  Hypertension: controlled  Hypothyroid: compensated  Hyperglycemia: normal on the last check  Memory loss: stable  CKD (chronic kidney disease) stage 3, GFR 30-59 ml/min: unchanged  Hyperlipemia: controlled  CAD (coronary artery disease): no angina or fatigue  Basal cell carcinoma of chest wall: resolved  GERD (gastroesophageal reflux disease): asymptomatic  Cardiomyopathy, ischemic: no chest discomfort, palpitations, signs or symptoms of CHF    Medications: Patient's Medications  New Prescriptions   No medications on file  Previous Medications   ACETAMINOPHEN (TYLENOL) 325 MG TABLET    Take 650 mg by mouth every 6 (six) hours as needed. For pain   ALLOPURINOL (ZYLOPRIM) 100 MG TABLET    TAKE 1 TABLET ONCE DAILY TO LOWER URIC ACID.   ASPIRIN EC 81 MG TABLET    Take 1 tablet (81 mg total) by mouth daily.   ATORVASTATIN (LIPITOR) 20 MG TABLET    TAKE 1 TABLET BY MOUTH EVERY DAY FOR CHOLESTEROL   CHOLECALCIFEROL (VITAMIN D) 1000 UNITS TABLET    Take 2,000 Units by mouth daily.   CLOPIDOGREL (PLAVIX) 75 MG TABLET    Take 75 mg by mouth daily with breakfast.   LATANOPROST (XALATAN) 0.005 % OPHTHALMIC SOLUTION    Place 1 drop into the right eye at bedtime.   LEVOTHYROXINE (SYNTHROID, LEVOTHROID) 50 MCG TABLET    TAKE 1 TABLET BY MOUTH DAILY FOR THYROID SUPPLEMENT   LOSARTAN (COZAAR) 25 MG TABLET    Take 1 tablet (25 mg total) by mouth daily.   METOPROLOL SUCCINATE (TOPROL XL) 25 MG 24 HR TABLET    Take 1 tablet (25 mg total) by mouth daily.   NITROGLYCERIN (NITROSTAT) 0.4 MG SL TABLET    Place 1 tablet (0.4 mg total) under the tongue every 5 (five)  minutes as needed for chest pain.  Modified Medications   No medications on file  Discontinued Medications   No medications on file     Review of Systems  Constitutional: Negative.   HENT: Negative.   Eyes: Positive for visual disturbance.  Respiratory: Negative for cough, choking, chest tightness and shortness of breath.   Cardiovascular: Negative for chest pain, palpitations and leg swelling.       MI 06/01/12.  Gastrointestinal: Negative.   Endocrine: Negative.   Genitourinary:       Hx prostate CA  Musculoskeletal: Negative.   Skin: Negative.   Neurological:       Mild memory deficit.  Hematological: Negative.   Psychiatric/Behavioral: Negative.     Filed Vitals:   09/15/13 1432  BP: 128/72  Pulse: 72  Weight: 178 lb (80.74 kg)   Physical Exam  Constitutional: He is oriented to person, place, and time. He appears well-developed and well-nourished. No distress.  HENT:  Head: Normocephalic and atraumatic.  Nose: Nose normal.  Partial loss of hearing.  Eyes:  Corrective lenses.  Neck: Normal range of motion. Neck supple. No JVD present. No tracheal deviation present. No thyromegaly present.  Cardiovascular: Normal rate, regular rhythm and normal heart sounds.  Exam reveals no gallop and no friction rub.   No murmur heard. Pulmonary/Chest:  Effort normal and breath sounds normal.  Abdominal: Soft. Bowel sounds are normal. He exhibits no distension and no mass. There is no tenderness.  Musculoskeletal: Normal range of motion. He exhibits edema (1+ bipedal). He exhibits no tenderness.  Lymphadenopathy:    He has no cervical adenopathy.  Neurological: He is alert and oriented to person, place, and time. He has normal reflexes. Coordination normal.  09/15/13 MMSE; 26/30. Passed clock drawing  Skin: No rash noted. No erythema. No pallor.  Psychiatric: He has a normal mood and affect. His behavior is normal. Judgment and thought content normal.     Labs  reviewed: Nursing Home on 09/15/2013  Component Date Value Ref Range Status  . Glucose 09/09/2013 83   Final  . BUN 09/09/2013 30* 4 - 21 mg/dL Final  . Creatinine 09/09/2013 1.4* 0.6 - 1.3 mg/dL Final  . Potassium 09/09/2013 4.6  3.4 - 5.3 mmol/L Final  . Sodium 09/09/2013 141  137 - 147 mmol/L Final  . LDl/HDL Ratio 09/09/2013 2.7   Final  . Triglycerides 09/09/2013 66  40 - 160 mg/dL Final  . Cholesterol 09/09/2013 138  0 - 200 mg/dL Final  . HDL 09/09/2013 52  35 - 70 mg/dL Final  . LDL Cholesterol 09/09/2013 73   Final  . Alkaline Phosphatase 09/09/2013 63  25 - 125 U/L Final  . ALT 09/09/2013 16  10 - 40 U/L Final  . AST 09/09/2013 19  14 - 40 U/L Final  . Bilirubin, Total 09/09/2013 0.4   Final  . TSH 09/09/2013 2.66  0.41 - 5.90 uIU/mL Final      Assessment/Plan  1. Hypertension controlled  2. Hypothyroid compensated  3. Hyperglycemia Normal on last lab  4. Memory loss stable  5. CKD (chronic kidney disease) stage 3, GFR 30-59 ml/min unchanged  6. Hyperlipemia controlled  7. CAD (coronary artery disease) stable  8. Basal cell carcinoma of chest wall resolved  9. GERD (gastroesophageal reflux disease) asymptomatic  10. Cardiomyopathy, ischemic Denies excessive dyspnea. Walks regularly. No angina.

## 2013-09-15 NOTE — Progress Notes (Signed)
Passed clock drawing 

## 2013-09-30 ENCOUNTER — Encounter: Payer: Self-pay | Admitting: *Deleted

## 2014-03-28 IMAGING — CR DG CHEST 1V PORT
1 series · 1 of 1 positions shown · non-contrast
Comparison: None.

CLINICAL DATA: Chest pain and emesis.

PORTABLE CHEST - 1 VIEW

[AP]
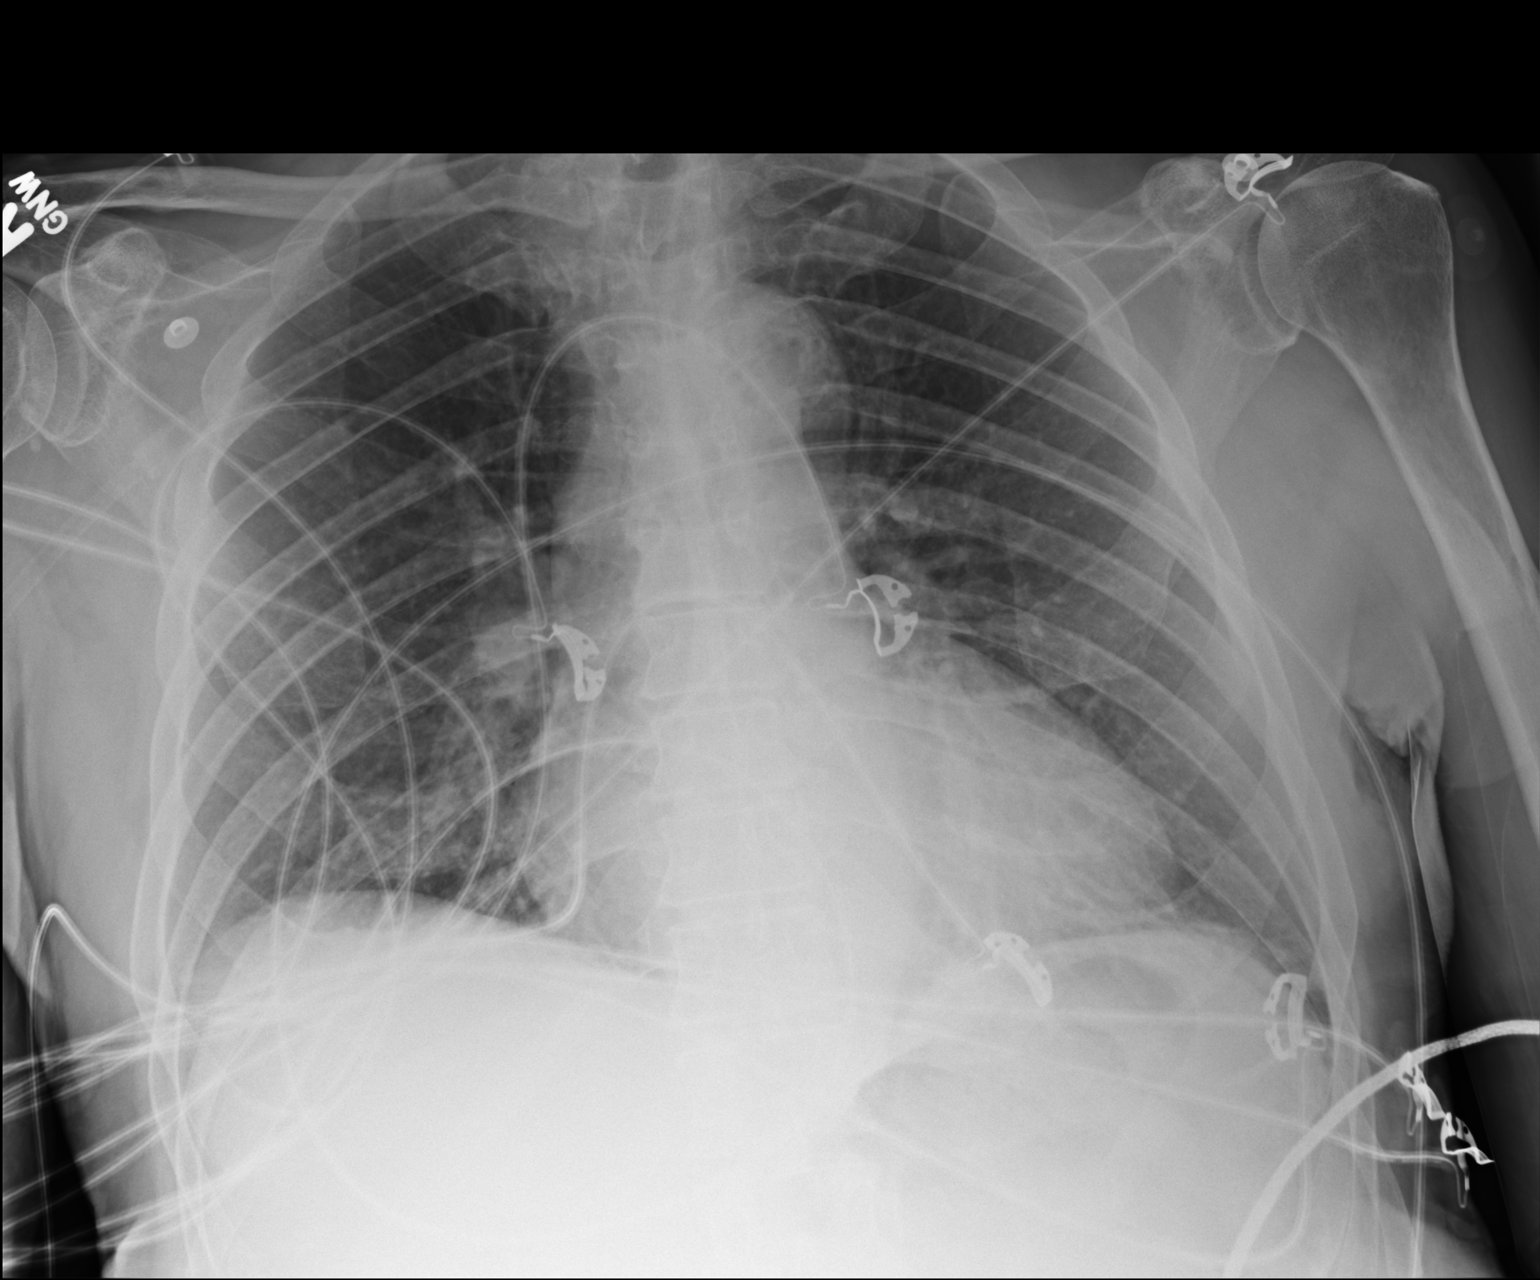

[1 of 1 positions shown; findings below may reference images not displayed]

FINDINGS: Shallow inspiration.  Borderline heart size with normal
pulmonary vascularity.  No focal airspace consolidation in the
lungs.  No blunting of costophrenic angles.  No pneumothorax.
Mediastinal contours appear intact.
IMPRESSION: Mild cardiac enlargement.  No evidence of active pulmonary disease.

## 2014-04-02 LAB — HEPATIC FUNCTION PANEL
ALK PHOS: 59 U/L (ref 25–125)
ALT: 18 U/L (ref 10–40)
AST: 28 U/L (ref 14–40)
Bilirubin, Total: 0.8 mg/dL

## 2014-04-02 LAB — LIPID PANEL
Cholesterol: 134 mg/dL (ref 0–200)
HDL: 51 mg/dL (ref 35–70)
LDL Cholesterol: 66 mg/dL
LDl/HDL Ratio: 2.6
TRIGLYCERIDES: 83 mg/dL (ref 40–160)

## 2014-04-02 LAB — BASIC METABOLIC PANEL
BUN: 27 mg/dL — AB (ref 4–21)
Creatinine: 1.4 mg/dL — AB (ref 0.6–1.3)
GLUCOSE: 81 mg/dL
Potassium: 4.5 mmol/L (ref 3.4–5.3)
SODIUM: 129 mmol/L — AB (ref 137–147)

## 2014-04-02 LAB — TSH: TSH: 2.43 u[IU]/mL (ref 0.41–5.90)

## 2014-04-06 ENCOUNTER — Non-Acute Institutional Stay: Payer: Medicare Other | Admitting: Internal Medicine

## 2014-04-06 VITALS — BP 140/74 | HR 80 | Temp 97.8°F | Ht 68.0 in | Wt 181.0 lb

## 2014-04-06 DIAGNOSIS — R739 Hyperglycemia, unspecified: Secondary | ICD-10-CM

## 2014-04-06 DIAGNOSIS — E785 Hyperlipidemia, unspecified: Secondary | ICD-10-CM

## 2014-04-06 DIAGNOSIS — K219 Gastro-esophageal reflux disease without esophagitis: Secondary | ICD-10-CM

## 2014-04-06 DIAGNOSIS — Z299 Encounter for prophylactic measures, unspecified: Secondary | ICD-10-CM

## 2014-04-06 DIAGNOSIS — I2510 Atherosclerotic heart disease of native coronary artery without angina pectoris: Secondary | ICD-10-CM

## 2014-04-06 DIAGNOSIS — N183 Chronic kidney disease, stage 3 unspecified: Secondary | ICD-10-CM

## 2014-04-06 DIAGNOSIS — S51811A Laceration without foreign body of right forearm, initial encounter: Secondary | ICD-10-CM

## 2014-04-06 DIAGNOSIS — R413 Other amnesia: Secondary | ICD-10-CM

## 2014-04-06 DIAGNOSIS — I10 Essential (primary) hypertension: Secondary | ICD-10-CM

## 2014-04-06 DIAGNOSIS — E039 Hypothyroidism, unspecified: Secondary | ICD-10-CM

## 2014-04-06 DIAGNOSIS — S51801A Unspecified open wound of right forearm, initial encounter: Secondary | ICD-10-CM

## 2014-04-06 DIAGNOSIS — M1A9XX0 Chronic gout, unspecified, without tophus (tophi): Secondary | ICD-10-CM

## 2014-04-06 DIAGNOSIS — Z418 Encounter for other procedures for purposes other than remedying health state: Secondary | ICD-10-CM

## 2014-04-06 DIAGNOSIS — I255 Ischemic cardiomyopathy: Secondary | ICD-10-CM

## 2014-04-06 NOTE — Progress Notes (Signed)
Patient ID: Damon Miller, male   DOB: Jun 15, 1919, 80 y.o.   MRN: [id number]    HISTORY AND PHYSICAL  Location:  [place] Room Number: [id number] Place of Service: Clinic ([phone])   Extended Emergency Contact Information Primary Emergency Contact: Flores,"Wat" [place] of [place] Mobile Phone: [phone] Relation: Son Secondary Emergency Contact: Edsel Petrin "Bab"          [place], [place] of [place] Mobile Phone: [phone] Relation: Daughter  Advanced Directive information Does patient have an advance directive?: Yes, Type of Advance Directive: Healthcare Power of [place];Living will;Out of facility DNR (pink MOST or yellow form), Pre-existing out of facility DNR order (yellow form or pink MOST form): Yellow form placed in chart (order not valid for inpatient use)  Chief Complaint  Patient presents with  . Annual Exam    Comprehensive exam: blood pressure, thyroid, hyperglycemia    HPI:  CKD (chronic kidney disease) stage 3, GFR 30-59 ml/min: stable  Essential hypertension: controlled  Hyperglycemia: normal on last check  Hyperlipemia: controlled  Hypothyroidism, unspecified hypothyroidism type: compensated  Chronic gout without tophus, unspecified cause, unspecified site: no recent attacks  Memory loss: stable  Gastroesophageal reflux disease without esophagitis: asymptomatic  Preventive measure - Plan: DNR (Do Not Resuscitate)  Cardiomyopathy, ischemic: compensated  Coronary artery disease involving native coronary artery of native heart without angina pectoris: stable  Skin tear of right forearm without complication, initial encounter: occurred at time of a simple fall. Steristrips are in place and it is healing    Past Medical History  Diagnosis Date  . Hyperlipemia   . Diverticulosis   . Prostate cancer   . Hypothyroid   . Vitamin D deficiency disease   . Cataracts, bilateral   . GERD  (gastroesophageal reflux disease)   . Arthritis   . Hypertension   . DNR (do not resuscitate)   . CAD (coronary artery disease)   . Chronic kidney disease, stage II (mild)   . Abnormality of gait   . Glaucoma     Right eye  . Allergic rhinitis due to pollen   . Hernia, hiatal   . Gout, unspecified   . Hyperglycemia 2014    Past Surgical History  Procedure Laterality Date  . Colonoscopy  07/1990, 10/1992, 12/1995 and 02/1999    Polyps removed at each procedure  . Skin cancer excision  2004    Ears  . Eye surgery Bilateral 1999    Cataract removal    Patient Care Team: Estill Dooms, MD as PCP - General (Internal Medicine) [place], NP as Nurse Practitioner (Geriatric Medicine) Lelon Perla, MD as Consulting Physician (Cardiology) Jacolyn Reedy, MD as Consulting Physician (Cardiology) Lora Paula, MD as Consulting Physician (Radiation Oncology) Michelene Gardener., MD as Consulting Physician (Dermatology) Trixie Rude., MD as Consulting Physician (Ophthalmology)  History   Social History  . Marital Status: Widowed    Spouse Name: N/A    Number of Children: N/A  . Years of Education: N/A   Occupational History  . retired from [employer] History Main Topics  . Smoking status: Former Smoker    Quit date: 05/16/1987  . Smokeless tobacco: Never Used  . Alcohol Use: 3.0 oz/week    5 Shots of liquor per week     Comment: Drinks one [employer] couple nights a week  . Drug Use: No  .  Sexual Activity: No   Other Topics Concern  . Not on file   Social History Narrative   Lives at [facility]   Widowed   DNR/POA/Living Will   Exercise: walking    Stopped smoking 1989   Alcohol on [place] couple nights a week                  reports that he quit smoking about 26 years ago. He has never used smokeless tobacco. He reports that he drinks about 3.0 oz of alcohol per week. He  reports that he does not use illicit drugs.  Family History  Problem Relation Age of Onset  . Heart attack Father    Family Status  Relation Status Death Age  . Mother Deceased 70    Cause of Death: Natural causes  . Father Deceased 9    Cause of Death: MI  . Sister Alive   . Brother Alive   . Daughter Alive   . Son Alive     Immunization History  Administered Date(s) Administered  . Influenza Whole 02/13/2012  . Influenza-Unspecified 02/18/2014  . Pneumococcal Polysaccharide-23 05/15/1998  . Zoster 05/15/2006    Allergies  Allergen Reactions  . Ibuprofen Rash    Medications: Patient's Medications  New Prescriptions   No medications on file  Previous Medications   ACETAMINOPHEN (TYLENOL) 325 MG TABLET    Take 650 mg by mouth every 6 (six) hours as needed. For pain   ALLOPURINOL (ZYLOPRIM) 100 MG TABLET    TAKE 1 TABLET ONCE DAILY TO LOWER URIC ACID.   ASPIRIN EC 81 MG TABLET    Take 1 tablet (81 mg total) by mouth daily.   ATORVASTATIN (LIPITOR) 20 MG TABLET    TAKE 1 TABLET BY MOUTH EVERY DAY FOR CHOLESTEROL   CHOLECALCIFEROL (VITAMIN D) 1000 UNITS TABLET    Take 2,000 Units by mouth daily.   CLOPIDOGREL (PLAVIX) 75 MG TABLET    Take 75 mg by mouth daily with breakfast.   LATANOPROST (XALATAN) 0.005 % OPHTHALMIC SOLUTION    Place 1 drop into the right eye at bedtime.   LEVOTHYROXINE (SYNTHROID, LEVOTHROID) 50 MCG TABLET    TAKE 1 TABLET BY MOUTH DAILY FOR THYROID SUPPLEMENT   LOSARTAN (COZAAR) 25 MG TABLET    Take 1 tablet (25 mg total) by mouth daily.   METOPROLOL SUCCINATE (TOPROL XL) 25 MG 24 HR TABLET    Take 1 tablet (25 mg total) by mouth daily.   NITROGLYCERIN (NITROSTAT) 0.4 MG SL TABLET    Place 1 tablet (0.4 mg total) under the tongue every 5 (five) minutes as needed for chest pain.  Modified Medications   No medications on file  Discontinued Medications   No medications on file    Review of Systems  Constitutional: Negative.  Negative for fever,  activity change, appetite change, fatigue and unexpected weight change.  HENT: Negative.  Negative for congestion, ear pain, hearing loss, rhinorrhea, sore throat, tinnitus, trouble swallowing and voice change.   Eyes: Positive for visual disturbance (corrective lenses).       Corrective lenses  Respiratory: Negative for cough, choking, chest tightness, shortness of breath and wheezing.   Cardiovascular: Negative for chest pain, palpitations and leg swelling.       MI 06/01/12.  Gastrointestinal: Negative.  Negative for nausea, abdominal pain, diarrhea, constipation and abdominal distention.  Endocrine: Negative.  Negative for cold intolerance, heat intolerance, polydipsia, polyphagia and polyuria.  Genitourinary: Negative for dysuria, urgency,  frequency and testicular pain.       Hx prostate CA  Musculoskeletal: Negative.  Negative for myalgias, back pain, arthralgias, gait problem and neck pain.  Skin: Negative for color change, pallor and rash.       Healing skin tear of the roght forearm  Allergic/Immunologic: Negative.   Neurological: Negative for dizziness, tremors, syncope, speech difficulty, weakness, numbness and headaches.       Mild memory deficit.  Hematological: Negative.  Negative for adenopathy. Does not bruise/bleed easily.  Psychiatric/Behavioral: Negative.  Negative for hallucinations, behavioral problems, confusion, sleep disturbance and decreased concentration. The patient is not nervous/anxious.     Filed Vitals:   04/06/14 1530  BP: 140/74  Pulse: 80  Temp: 97.8 F (36.6 C)  TempSrc: Oral  Height: 5\' 8"  (1.727 m)  Weight: 181 lb (82.101 kg)  SpO2: 96%   Body mass index is 27.53 kg/(m^2).  Physical Exam  Constitutional: He is oriented to person, place, and time. He appears well-developed and well-nourished. No distress.  HENT:  Head: Normocephalic and atraumatic.  Nose: Nose normal.  Partial loss of hearing.  Eyes:  Corrective lenses.  Neck: Normal range  of motion. Neck supple. No JVD present. No tracheal deviation present. No thyromegaly present.  Cardiovascular: Normal rate, regular rhythm and normal heart sounds.  Exam reveals no gallop and no friction rub.   No murmur heard. Pulmonary/Chest: Effort normal and breath sounds normal.  Abdominal: Soft. Bowel sounds are normal. He exhibits no distension and no mass. There is no tenderness.  Genitourinary: Rectum normal, prostate normal and penis normal. Guaiac negative stool. No penile tenderness.  Musculoskeletal: Normal range of motion. He exhibits edema (1+ bipedal). He exhibits no tenderness.  Lymphadenopathy:    He has no cervical adenopathy.  Neurological: He is alert and oriented to person, place, and time. He has normal reflexes. Coordination normal.  09/15/13 MMSE; 26/30. Passed clock drawing  Skin: No rash noted. No erythema. No pallor.  3 cm skin tear right lower arm. Wound is not infected. Closed with steristrips.  Psychiatric: He has a normal mood and affect. His behavior is normal. Judgment and thought content normal.     Labs reviewed: Nursing Home on 04/06/2014  Component Date Value Ref Range Status  . Glucose 04/02/2014 81   Final  . BUN 04/02/2014 27* 4 - 21 mg/dL Final  . Creatinine 04/02/2014 1.4* 0.6 - 1.3 mg/dL Final  . Potassium 04/02/2014 4.5  3.4 - 5.3 mmol/L Final  . Sodium 04/02/2014 129* 137 - 147 mmol/L Final  . LDl/HDL Ratio 04/02/2014 2.6   Final  . Triglycerides 04/02/2014 83  40 - 160 mg/dL Final  . Cholesterol 04/02/2014 134  0 - 200 mg/dL Final  . HDL 04/02/2014 51  35 - 70 mg/dL Final  . LDL Cholesterol 04/02/2014 66   Final  . Alkaline Phosphatase 04/02/2014 59  25 - 125 U/L Final  . ALT 04/02/2014 18  10 - 40 U/L Final  . AST 04/02/2014 28  14 - 40 U/L Final  . Bilirubin, Total 04/02/2014 0.8   Final  . TSH 04/02/2014 2.43  0.41 - 5.90 uIU/mL Final    Dg Chest Port 1 View  06/01/2012   *RADIOLOGY REPORT*  Clinical Data: Chest pain and emesis.   PORTABLE CHEST - 1 VIEW  Comparison: None.  Findings: Shallow inspiration.  Borderline heart size with normal pulmonary vascularity.  No focal airspace consolidation in the lungs.  No blunting of costophrenic angles.  No pneumothorax. Mediastinal contours appear intact.  IMPRESSION: Mild cardiac enlargement.  No evidence of active pulmonary disease.   Original Report Authenticated By: Lucienne Capers, M.D.     Assessment/Plan  1. CKD (chronic kidney disease) stage 3, GFR 30-59 ml/min Stable -CMP, future  2. Essential hypertension controlled  3. Hyperglycemia Normal when last checked -CMP, furure  4. Hyperlipemia controlled  5. Hypothyroidism, unspecified hypothyroidism type Compensated -TSH, future  6. Chronic gout without tophus, unspecified cause, unspecified site controlled  7. Memory loss stsable  8. Gastroesophageal reflux disease without esophagitis asymptomatic  9. Preventive measure - DNR (Do Not Resuscitate)  10. Cardiomyopathy, ischemic compensated  11. Coronary artery disease involving native coronary artery of native heart without angina pectoris stablke  12. Skin tear of right forearm without complication, initial encounter healing

## 2014-04-21 ENCOUNTER — Encounter: Payer: Self-pay | Admitting: Internal Medicine

## 2014-05-19 DIAGNOSIS — B351 Tinea unguium: Secondary | ICD-10-CM | POA: Diagnosis not present

## 2014-05-19 DIAGNOSIS — M79671 Pain in right foot: Secondary | ICD-10-CM | POA: Diagnosis not present

## 2014-05-19 DIAGNOSIS — M79672 Pain in left foot: Secondary | ICD-10-CM | POA: Diagnosis not present

## 2014-05-19 DIAGNOSIS — L84 Corns and callosities: Secondary | ICD-10-CM | POA: Diagnosis not present

## 2014-06-17 ENCOUNTER — Encounter: Payer: Self-pay | Admitting: Nurse Practitioner

## 2014-06-17 ENCOUNTER — Non-Acute Institutional Stay: Payer: Medicare Other | Admitting: Nurse Practitioner

## 2014-06-17 VITALS — BP 120/74 | HR 68 | Temp 98.4°F | Wt 181.0 lb

## 2014-06-17 DIAGNOSIS — F0391 Unspecified dementia with behavioral disturbance: Secondary | ICD-10-CM | POA: Diagnosis not present

## 2014-06-17 DIAGNOSIS — F03918 Unspecified dementia, unspecified severity, with other behavioral disturbance: Secondary | ICD-10-CM

## 2014-06-17 NOTE — Progress Notes (Signed)
Patient ID: Damon Miller, male   DOB: 11-28-1919, 81 y.o.   MRN: [id number]    Nursing Home Location:  [place] of Service: Clinic (12)  PCP: Estill Dooms, MD  Allergies  Allergen Reactions  . Ibuprofen Rash    Chief Complaint  Patient presents with  . Agitation    chronic, decrease in memory, and angry with showers and other ADL's -per nurse    HPI:  Patient is a 81 y.o. male seen today at [facility] [address] for acute visit per nursing. Nursing reports pt has had progressive memory decline. Recently with increase agitation and refusing personal care. MMSE was last done in 2014 which showed a 24/30. Currently not on medications for memory. Today's MMSE was 23/30.  Pt does not see this as an issue. Attempted to call son but no answer Review of Systems:  Review of Systems  Constitutional: Negative for fever, chills and fatigue.  Respiratory: Negative for cough and shortness of breath.   Cardiovascular: Negative for chest pain, palpitations and leg swelling.  Gastrointestinal: Negative for nausea, diarrhea and constipation.  Musculoskeletal: Negative for myalgias, gait problem and neck pain.  Skin: Negative for color change, pallor, rash and wound.  Neurological: Negative for dizziness, light-headedness and headaches.  Psychiatric/Behavioral: Positive for confusion.    Past Medical History  Diagnosis Date  . Hyperlipemia   . Diverticulosis   . Prostate cancer   . Hypothyroid   . Vitamin D deficiency disease   . Cataracts, bilateral   . GERD (gastroesophageal reflux disease)   . Arthritis   . Hypertension   . DNR (do not resuscitate)   . CAD (coronary artery disease)   . Chronic kidney disease, stage II (mild)   . Abnormality of gait   . Glaucoma     Right eye  . Allergic rhinitis due to pollen   . Hernia, hiatal   . Gout, unspecified   . Hyperglycemia 2014   Past Surgical History  Procedure Laterality Date  .  Colonoscopy  07/1990, 10/1992, 12/1995 and 02/1999    Polyps removed at each procedure  . Skin cancer excision  2004    Ears  . Eye surgery Bilateral 1999    Cataract removal   Social History:   reports that he quit smoking about 27 years ago. He has never used smokeless tobacco. He reports that he drinks about 3.0 oz of alcohol per week. He reports that he does not use illicit drugs.  Family History  Problem Relation Age of Onset  . Heart attack Father     Medications: Patient's Medications  New Prescriptions   No medications on file  Previous Medications   ACETAMINOPHEN (TYLENOL) 325 MG TABLET    Take 650 mg by mouth every 6 (six) hours as needed. For pain   ALLOPURINOL (ZYLOPRIM) 100 MG TABLET    TAKE 1 TABLET ONCE DAILY TO LOWER URIC ACID.   ASPIRIN EC 81 MG TABLET    Take 1 tablet (81 mg total) by mouth daily.   ATORVASTATIN (LIPITOR) 20 MG TABLET    TAKE 1 TABLET BY MOUTH EVERY DAY FOR CHOLESTEROL   CHOLECALCIFEROL (VITAMIN D) 1000 UNITS TABLET    Take 2,000 Units by mouth daily.   CLOPIDOGREL (PLAVIX) 75 MG TABLET    Take 75 mg by mouth daily with breakfast.   LATANOPROST (XALATAN) 0.005 % OPHTHALMIC SOLUTION    Place 1 drop into the right eye at bedtime.  LEVOTHYROXINE (SYNTHROID, LEVOTHROID) 50 MCG TABLET    TAKE 1 TABLET BY MOUTH DAILY FOR THYROID SUPPLEMENT   LOSARTAN (COZAAR) 25 MG TABLET    Take 1 tablet (25 mg total) by mouth daily.   METOPROLOL SUCCINATE (TOPROL XL) 25 MG 24 HR TABLET    Take 1 tablet (25 mg total) by mouth daily.   NITROGLYCERIN (NITROSTAT) 0.4 MG SL TABLET    Place 1 tablet (0.4 mg total) under the tongue every 5 (five) minutes as needed for chest pain.  Modified Medications   No medications on file  Discontinued Medications   No medications on file     Physical Exam: Filed Vitals:   06/17/14 1512  BP: 120/74  Pulse: 68  Temp: 98.4 F (36.9 C)  TempSrc: Oral  Weight: 181 lb (82.101 kg)  SpO2: 94%    Physical Exam  Constitutional: He  appears well-developed and well-nourished.  HENT:  Head: Normocephalic and atraumatic.  Neck: Normal range of motion. Neck supple.  Cardiovascular: Normal rate, regular rhythm and normal heart sounds.   Pulmonary/Chest: Effort normal and breath sounds normal.  Neurological: He is alert. He is disoriented.  MMSE was 23/30 - failed clock and word recall.   Skin: Skin is warm and dry.    Labs reviewed: Basic Metabolic Panel:  Recent Labs  09/09/13 04/02/14  NA 141 129*  K 4.6 4.5  BUN 30* 27*  CREATININE 1.4* 1.4*   Liver Function Tests:  Recent Labs  09/09/13 04/02/14  AST 19 28  ALT 16 18  ALKPHOS 63 59   No results for input(s): LIPASE, AMYLASE in the last 8760 hours. No results for input(s): AMMONIA in the last 8760 hours. CBC: No results for input(s): WBC, NEUTROABS, HGB, HCT, MCV, PLT in the last 8760 hours. TSH:  Recent Labs  09/09/13 04/02/14  TSH 2.66 2.43   A1C: No results found for: HGBA1C Lipid Panel:  Recent Labs  09/09/13 04/02/14  CHOL 138 134  HDL 52 51  LDLCALC 73 66  TRIG 66 83      Assessment/Plan 1. Dementia with behaviors  Worsening memory loss with resisting personal care- Will start Namenda titration to 28 mg XR. Once completed if tolerating will consider Aricept start   Follow up with Dr. Mariea Clonts scheduled for 5/24.

## 2014-06-17 NOTE — Progress Notes (Signed)
Fell clock drawing

## 2014-09-02 ENCOUNTER — Encounter: Payer: Self-pay | Admitting: Nurse Practitioner

## 2014-09-02 ENCOUNTER — Non-Acute Institutional Stay: Payer: Medicare Other | Admitting: Nurse Practitioner

## 2014-09-02 VITALS — BP 120/66 | HR 80 | Temp 98.0°F | Wt 190.0 lb

## 2014-09-02 DIAGNOSIS — L299 Pruritus, unspecified: Secondary | ICD-10-CM | POA: Diagnosis not present

## 2014-09-02 NOTE — Progress Notes (Signed)
Patient ID: Damon Miller, male   DOB: 1919-12-31, 81 y.o.   MRN: [id number]    Nursing Home Location:  [place] of Service: Clinic (12)  PCP: Estill Dooms, MD  Allergies  Allergen Reactions  . Ibuprofen Rash    Chief Complaint  Patient presents with  . sores    on back of neck and left shoulder, does not itch    HPI:  Patient is a 81 y.o. male seen today at [facility] for acute visit per nursing. Nursing reports pt has been scratching at skin to the back of his neck and arms. Pt denies any itching, irritation or scratching, however pt with dementia and is a poor historian. Nursing noted small sores to back of neck and arms, wanted this evaluated.  No new medications except ativan three times a week was added due to agitation with bath times, which has helped allowing for easier and proper bathing.   Review of Systems  Constitutional: Negative for fever, chills and fatigue.  Respiratory: Negative for cough and shortness of breath.   Cardiovascular: Negative for chest pain, palpitations and leg swelling.  Gastrointestinal: Negative for nausea, diarrhea and constipation.  Musculoskeletal: Negative for myalgias, gait problem and neck pain.  Skin: Negative for color change, pallor, rash and wound.  Neurological: Negative for dizziness, light-headedness and headaches.  Psychiatric/Behavioral: Positive for confusion.    Past Medical History  Diagnosis Date  . Hyperlipemia   . Diverticulosis   . Prostate cancer   . Hypothyroid   . Vitamin D deficiency disease   . Cataracts, bilateral   . GERD (gastroesophageal reflux disease)   . Arthritis   . Hypertension   . DNR (do not resuscitate)   . CAD (coronary artery disease)   . Chronic kidney disease, stage II (mild)   . Abnormality of gait   . Glaucoma     Right eye  . Allergic rhinitis due to pollen   . Hernia, hiatal   . Gout, unspecified   . Hyperglycemia 2014    Past Surgical History  Procedure Laterality Date  . Colonoscopy  07/1990, 10/1992, 12/1995 and 02/1999    Polyps removed at each procedure  . Skin cancer excision  2004    Ears  . Eye surgery Bilateral 1999    Cataract removal   Social History:   reports that he quit smoking about 27 years ago. He has never used smokeless tobacco. He reports that he drinks about 3.0 oz of alcohol per week. He reports that he does not use illicit drugs.  Family History  Problem Relation Age of Onset  . Heart attack Father     Medications: Patient's Medications  New Prescriptions   No medications on file  Previous Medications   ACETAMINOPHEN (TYLENOL) 325 MG TABLET    Take 650 mg by mouth every 6 (six) hours as needed. For pain   ALLOPURINOL (ZYLOPRIM) 100 MG TABLET    TAKE 1 TABLET ONCE DAILY TO LOWER URIC ACID.   ASPIRIN EC 81 MG TABLET    Take 1 tablet (81 mg total) by mouth daily.   ATORVASTATIN (LIPITOR) 20 MG TABLET    TAKE 1 TABLET BY MOUTH EVERY DAY FOR CHOLESTEROL   CHOLECALCIFEROL (VITAMIN D) 1000 UNITS TABLET    Take 2,000 Units by mouth daily.   CLOPIDOGREL (PLAVIX) 75 MG TABLET    Take 75 mg by mouth daily with breakfast.   LATANOPROST (XALATAN) 0.005 % OPHTHALMIC  SOLUTION    Place 1 drop into the right eye at bedtime.   LEVOTHYROXINE (SYNTHROID, LEVOTHROID) 50 MCG TABLET    TAKE 1 TABLET BY MOUTH DAILY FOR THYROID SUPPLEMENT   LORAZEPAM (ATIVAN) 0.5 MG TABLET    Take 0.25mg  prior to each shower twice weekly with direct supervision during personal care. On Sunday and Wednesday   LOSARTAN (COZAAR) 25 MG TABLET    Take 1 tablet (25 mg total) by mouth daily.   METOPROLOL SUCCINATE (TOPROL XL) 25 MG 24 HR TABLET    Take 1 tablet (25 mg total) by mouth daily.   NAMENDA XR 28 MG CP24 24 HR CAPSULE    Take one daily for memory   NITROGLYCERIN (NITROSTAT) 0.4 MG SL TABLET    Place 1 tablet (0.4 mg total) under the tongue every 5 (five) minutes as needed for chest pain.  Modified Medications    No medications on file  Discontinued Medications   No medications on file     Physical Exam: Filed Vitals:   09/02/14 1002  BP: 120/66  Pulse: 80  Temp: 98 F (36.7 C)  TempSrc: Oral  Weight: 190 lb (86.183 kg)    Physical Exam  Constitutional: He appears well-developed and well-nourished.  HENT:  Head: Normocephalic and atraumatic.  Neck: Normal range of motion. Neck supple.  Cardiovascular: Normal rate, regular rhythm and normal heart sounds.   Pulmonary/Chest: Effort normal and breath sounds normal.  Neurological: He is alert. He is disoriented.  MMSE was 23/30 - failed clock and word recall.   Skin: Skin is warm and dry.  Multiple small sores that have no drainage or erythema to posterior neck, left anterior shoulder and sporadically throughout right arm     Labs reviewed: Basic Metabolic Panel:  Recent Labs  09/09/13 04/02/14  NA 141 129*  K 4.6 4.5  BUN 30* 27*  CREATININE 1.4* 1.4*   Liver Function Tests:  Recent Labs  09/09/13 04/02/14  AST 19 28  ALT 16 18  ALKPHOS 63 59   No results for input(s): LIPASE, AMYLASE in the last 8760 hours. No results for input(s): AMMONIA in the last 8760 hours. CBC: No results for input(s): WBC, NEUTROABS, HGB, HCT, MCV, PLT in the last 8760 hours. TSH:  Recent Labs  09/09/13 04/02/14  TSH 2.66 2.43   A1C: No results found for: HGBA1C Lipid Panel:  Recent Labs  09/09/13 04/02/14  CHOL 138 134  HDL 52 51  LDLCALC 73 66  TRIG 66 83      Assessment/Plan 1. Pruritus of skin - pt with increased itching of skin causing sores, staff to keep nails cut short to prevent skin sores - hydrOXYzine (VISTARIL) 25 MG capsule; Take 1 capsule (25 mg total) by mouth 2 (two) times daily.  Dispense: 30 capsule scheduled for 7 days -Eucerin lotion to be applied to skin twice daily -no current signs of infections but nursing to provide ongoing monitoring of skin and open areas -recently with added ativan that is given on  shower days, will cont at this time because it appears to be helping but if itching persist may want to trial off this to see if it could be a possible cause.

## 2014-09-06 MED ORDER — HYDROXYZINE PAMOATE 25 MG PO CAPS: 25.0000 mg | ORAL_CAPSULE | Freq: Two times a day (BID) | ORAL | Status: DC

## 2014-09-29 DIAGNOSIS — E039 Hypothyroidism, unspecified: Secondary | ICD-10-CM | POA: Diagnosis not present

## 2014-09-29 DIAGNOSIS — I10 Essential (primary) hypertension: Secondary | ICD-10-CM | POA: Diagnosis not present

## 2014-09-29 DIAGNOSIS — E785 Hyperlipidemia, unspecified: Secondary | ICD-10-CM | POA: Diagnosis not present

## 2014-09-29 LAB — HEPATIC FUNCTION PANEL
ALT: 16 U/L (ref 10–40)
AST: 16 U/L (ref 14–40)
Alkaline Phosphatase: 67 U/L (ref 25–125)
Bilirubin, Total: 0.6 mg/dL

## 2014-09-29 LAB — LIPID PANEL
CHOLESTEROL: 153 mg/dL (ref 0–200)
HDL: 62 mg/dL (ref 35–70)
LDL Cholesterol: 75 mg/dL
Triglycerides: 81 mg/dL (ref 40–160)

## 2014-09-29 LAB — BASIC METABOLIC PANEL
BUN: 26 mg/dL — AB (ref 4–21)
Creatinine: 1.3 mg/dL (ref 0.6–1.3)
GLUCOSE: 89 mg/dL
Potassium: 4.3 mmol/L (ref 3.4–5.3)
Sodium: 138 mmol/L (ref 137–147)

## 2014-09-29 LAB — TSH: TSH: 1.86 u[IU]/mL (ref 0.41–5.90)

## 2014-10-05 ENCOUNTER — Encounter: Payer: Self-pay | Admitting: Internal Medicine

## 2014-10-06 ENCOUNTER — Non-Acute Institutional Stay: Payer: Medicare Other | Admitting: Internal Medicine

## 2014-10-06 ENCOUNTER — Encounter: Payer: Self-pay | Admitting: Internal Medicine

## 2014-10-06 VITALS — BP 120/74 | HR 68 | Temp 97.5°F | Wt 192.0 lb

## 2014-10-06 DIAGNOSIS — I10 Essential (primary) hypertension: Secondary | ICD-10-CM

## 2014-10-06 DIAGNOSIS — N183 Chronic kidney disease, stage 3 unspecified: Secondary | ICD-10-CM

## 2014-10-06 DIAGNOSIS — F0391 Unspecified dementia with behavioral disturbance: Secondary | ICD-10-CM | POA: Diagnosis not present

## 2014-10-06 DIAGNOSIS — R739 Hyperglycemia, unspecified: Secondary | ICD-10-CM

## 2014-10-06 DIAGNOSIS — Z23 Encounter for immunization: Secondary | ICD-10-CM

## 2014-10-06 DIAGNOSIS — E785 Hyperlipidemia, unspecified: Secondary | ICD-10-CM | POA: Diagnosis not present

## 2014-10-06 DIAGNOSIS — E039 Hypothyroidism, unspecified: Secondary | ICD-10-CM | POA: Diagnosis not present

## 2014-10-06 DIAGNOSIS — I2510 Atherosclerotic heart disease of native coronary artery without angina pectoris: Secondary | ICD-10-CM | POA: Diagnosis not present

## 2014-10-06 DIAGNOSIS — F03918 Unspecified dementia, unspecified severity, with other behavioral disturbance: Secondary | ICD-10-CM

## 2014-10-06 NOTE — Progress Notes (Signed)
Patient ID: Damon Miller, male   DOB: November 21, 1919, 81 y.o.   MRN: [id number]   Location:  [facility]  Code Status: DNR  Goals of Care:Advanced Directive information Does patient have an advance directive?: Yes, Type of Advance Directive: [place];Living will;Out of facility DNR (pink MOST or yellow form), Pre-existing out of facility DNR order (yellow form or pink MOST form): Yellow form placed in chart (order not valid for inpatient use)  Chief Complaint  Patient presents with  . Medical Management of Chronic Issues    blood pressure, thyroid, memory, hyperglycemia    HPI: Patient is a 81 y.o. white male seen in the [facility] today for med mgt of his chronic diseases.  [employer] form was completed.   He has no concerning signs or symptoms of COPD, but has smoked in the past.    He had his last MMSE done on 07/30/14 and scored 20/30.   He is not depressed with PHQ-2.   Due to his dementia, further screening for PAD not appropriate. He walks some for exercise.  Otherwise plays bingo. Renal function has improved a bit with cr 1.3.  CKD labs were ordered today. Has CAD and hyperlipidemia on appropriate therapy. No longer on meds for GERD   Continues on synthroid for his hypothyroidism with TSH nl at 1.86 on 09/29/14.   No recent need for ntg sublingual for anginal pain.  No recent chf exacerbations. Lipids at goal with statin.  Denies pain.  Sleeps well at night.  Bowels move well.  No chest pain, no shortness of breath.  No falls.  Feels contented in his mood.  Appetite is good.  No difficulty urinating.  No incontinence.  Seeing well with his glasses.  Hearing well.    Review of Systems:  Review of Systems  Constitutional: Negative for fever, chills and malaise/fatigue.  HENT: Negative for congestion and hearing loss.   Eyes: Negative for blurred vision.  Respiratory: Negative for cough and shortness of breath.   Cardiovascular: Positive for leg  swelling. Negative for chest pain.       Unchanged, wears diabetic socks, appears to have some venous insufficiency  Gastrointestinal: Negative for heartburn, abdominal pain, constipation, blood in stool and melena.  Genitourinary: Negative for dysuria, urgency and frequency.       Denies incontinence  Musculoskeletal: Negative for back pain, joint pain and falls.  Skin: Negative for rash.  Neurological: Negative for dizziness, loss of consciousness and headaches.  Endo/Heme/Allergies: Bruises/bleeds easily.  Psychiatric/Behavioral: Positive for memory loss. Negative for depression. The patient is not nervous/anxious and does not have insomnia.     Past Medical History  Diagnosis Date  . Hyperlipemia   . Diverticulosis   . Prostate cancer   . Hypothyroid   . Vitamin D deficiency disease   . Cataracts, bilateral   . GERD (gastroesophageal reflux disease)   . Arthritis   . Hypertension   . DNR (do not resuscitate)   . CAD (coronary artery disease)   . Chronic kidney disease, stage II (mild)   . Abnormality of gait   . Glaucoma     Right eye  . Allergic rhinitis due to pollen   . Hernia, hiatal   . Gout, unspecified   . Hyperglycemia 2014    Past Surgical History  Procedure Laterality Date  . Colonoscopy  07/1990, 10/1992, 12/1995 and 02/1999    Polyps removed at each procedure  . Skin cancer excision  2004  Ears  . Eye surgery Bilateral 1999    Cataract removal    Social History:   reports that he quit smoking about 27 years ago. He has never used smokeless tobacco. He reports that he drinks about 3.0 oz of alcohol per week. He reports that he does not use illicit drugs.  Allergies  Allergen Reactions  . Ibuprofen Rash    Medications: Patient's Medications  New Prescriptions   No medications on file  Previous Medications   ACETAMINOPHEN (TYLENOL) 325 MG TABLET    Take 650 mg by mouth every 6 (six) hours as needed. For pain   ALLOPURINOL (ZYLOPRIM) 100 MG TABLET     TAKE 1 TABLET ONCE DAILY TO LOWER URIC ACID.   ASPIRIN EC 81 MG TABLET    Take 1 tablet (81 mg total) by mouth daily.   ATORVASTATIN (LIPITOR) 20 MG TABLET    TAKE 1 TABLET BY MOUTH EVERY DAY FOR CHOLESTEROL   CHOLECALCIFEROL (VITAMIN D) 1000 UNITS TABLET    Take 2,000 Units by mouth daily.   CLOPIDOGREL (PLAVIX) 75 MG TABLET    Take 75 mg by mouth daily with breakfast.   HYDROXYZINE (VISTARIL) 25 MG CAPSULE    Take 1 capsule (25 mg total) by mouth 2 (two) times daily.   LATANOPROST (XALATAN) 0.005 % OPHTHALMIC SOLUTION    Place 1 drop into the right eye at bedtime.   LEVOTHYROXINE (SYNTHROID, LEVOTHROID) 50 MCG TABLET    TAKE 1 TABLET BY MOUTH DAILY FOR THYROID SUPPLEMENT   LORAZEPAM (ATIVAN) 0.5 MG TABLET    Take 0.25mg  prior to each shower twice weekly with direct supervision during personal care. On Sunday and Wednesday Additional tablet with additional shower day   LOSARTAN (COZAAR) 25 MG TABLET    Take 1 tablet (25 mg total) by mouth daily.   METOPROLOL SUCCINATE (TOPROL XL) 25 MG 24 HR TABLET    Take 1 tablet (25 mg total) by mouth daily.   NAMENDA XR 28 MG CP24 24 HR CAPSULE    Take one daily for memory   NITROGLYCERIN (NITROSTAT) 0.4 MG SL TABLET    Place 1 tablet (0.4 mg total) under the tongue every 5 (five) minutes as needed for chest pain.  Modified Medications   No medications on file  Discontinued Medications   No medications on file     Physical Exam: Filed Vitals:   10/06/14 1416  BP: 120/74  Pulse: 68  Temp: 97.5 F (36.4 C)  TempSrc: Oral  Weight: 192 lb (87.091 kg)   Body mass index is 29.2 kg/(m^2). Physical Exam  Constitutional: He appears well-developed and well-nourished. No distress.  Eyes:  glasses  Cardiovascular: Normal rate, regular rhythm, normal heart sounds and intact distal pulses.   Chronic venous insufficiency  Pulmonary/Chest: Effort normal and breath sounds normal.  Abdominal: Soft. Bowel sounds are normal.  Musculoskeletal: Normal range  of motion. He exhibits no tenderness.  Neurological: He is alert.  Skin: Skin is warm and dry.  Psychiatric: He has a normal mood and affect.  Very pleasant, quiet man     Labs reviewed: Basic Metabolic Panel:  Recent Labs  04/02/14 09/29/14  NA 129* 138  K 4.5 4.3  BUN 27* 26*  CREATININE 1.4* 1.3  TSH 2.43 1.86   Liver Function Tests:  Recent Labs  04/02/14 09/29/14  AST 28 16  ALT 18 16  ALKPHOS 59 67  Lipid Panel:  Recent Labs  04/02/14 09/29/14  CHOL 134 153  HDL  51 62  LDLCALC 66 75  TRIG 83 81   Patient Care Team: Gayland Curry, DO as PCP - General (Geriatric Medicine) [facility] Lelon Perla, MD as Consulting Physician (Cardiology) Jacolyn Reedy, MD as Consulting Physician (Cardiology) Tyler Pita, MD as Consulting Physician (Radiation Oncology) Allyn Kenner, MD as Consulting Physician (Dermatology) Prentiss Bells, MD as Consulting Physician (Ophthalmology)  Assessment/Plan 1. CKD (chronic kidney disease) stage 3, GFR 30-59 ml/min -slightly improved since last assessment -will check screening pth, phos, ica once   2. Dementia with behavioral disturbance -is gradually progressing -was very calm and quiet during appointment w/o complaints  -MMSE 2 mos ago was 20/30, he continues on namenda XR 28mg  daily w/o complications -has some ativan for anxiety which it appears has led to some skin irritation/pruritis so on some vistaril  (these do not help his cognition, but it appears he needs them at present)  3. Essential hypertension -bp is well controlled--onlly on cozaar and toprol xl for his cad  4. Hyperglycemia -will reassess hba1c before next visit (was not done with last labs) -has good appetite  5. Coronary artery disease involving native coronary artery of native heart without angina pectoris -has prn ntg, but has not needed this, cont asa, plavix, toprol, lipitor and cozaar for this  6. Hypothyroidism, unspecified  hypothyroidism type -cont synthroid at 12mcg first thing in am on empty stomach separate from other meds -last tsh wnl this month  7. Hyperlipidemia -lipids at goal with his lipitor this month, no changes needed, no statin side effects noted  8.  Need for 13-valent pneumonia vaccine:  prevnar was given  Labs/tests ordered:  Cbc, cmp, tsh, hba1c, pth, ica, phos before next visit Next appt:  6 mos  Javarious Elsayed L. Juanantonio Stolar, D.O. [facility] [address], [place] [address] Cell Phone (Mon-Fri 8am-5pm):  [phone] On Call:  [phone] & follow prompts after 5pm & weekends Office Phone:  [phone] Office Fax:  [phone]

## 2014-10-08 DIAGNOSIS — E213 Hyperparathyroidism, unspecified: Secondary | ICD-10-CM | POA: Diagnosis not present

## 2015-03-02 DIAGNOSIS — M79671 Pain in right foot: Secondary | ICD-10-CM | POA: Diagnosis not present

## 2015-03-02 DIAGNOSIS — B351 Tinea unguium: Secondary | ICD-10-CM | POA: Diagnosis not present

## 2015-03-02 DIAGNOSIS — M79672 Pain in left foot: Secondary | ICD-10-CM | POA: Diagnosis not present

## 2015-03-02 DIAGNOSIS — L84 Corns and callosities: Secondary | ICD-10-CM | POA: Diagnosis not present

## 2015-03-30 DIAGNOSIS — E039 Hypothyroidism, unspecified: Secondary | ICD-10-CM | POA: Diagnosis not present

## 2015-03-30 DIAGNOSIS — N183 Chronic kidney disease, stage 3 (moderate): Secondary | ICD-10-CM | POA: Diagnosis not present

## 2015-03-30 DIAGNOSIS — R739 Hyperglycemia, unspecified: Secondary | ICD-10-CM | POA: Diagnosis not present

## 2015-03-30 LAB — HEPATIC FUNCTION PANEL
ALK PHOS: 60 U/L (ref 25–125)
ALT: 11 U/L (ref 10–40)
AST: 15 U/L (ref 14–40)
Bilirubin, Total: 0.6 mg/dL

## 2015-03-30 LAB — BASIC METABOLIC PANEL
BUN: 25 mg/dL — AB (ref 4–21)
CREATININE: 1.3 mg/dL (ref 0.6–1.3)
Glucose: 77 mg/dL
Potassium: 4.3 mmol/L (ref 3.4–5.3)
Sodium: 139 mmol/L (ref 137–147)

## 2015-03-30 LAB — CBC AND DIFFERENTIAL
HCT: 35 % — AB (ref 41–53)
Hemoglobin: 12.2 g/dL — AB (ref 13.5–17.5)
Platelets: 197 10*3/uL (ref 150–399)
WBC: 6.9 10^3/mL

## 2015-03-30 LAB — TSH: TSH: 2.09 u[IU]/mL (ref 0.41–5.90)

## 2015-03-30 LAB — HEMOGLOBIN A1C: Hgb A1c MFr Bld: 6.1 % — AB (ref 4.0–6.0)

## 2015-04-20 ENCOUNTER — Encounter: Payer: Self-pay | Admitting: Internal Medicine

## 2015-04-21 ENCOUNTER — Encounter: Payer: Self-pay | Admitting: Internal Medicine

## 2015-04-21 ENCOUNTER — Non-Acute Institutional Stay: Payer: Medicare Other | Admitting: Internal Medicine

## 2015-04-21 VITALS — BP 142/82 | HR 66 | Temp 98.5°F | Ht 68.0 in | Wt 175.0 lb

## 2015-04-21 DIAGNOSIS — R739 Hyperglycemia, unspecified: Secondary | ICD-10-CM | POA: Diagnosis not present

## 2015-04-21 DIAGNOSIS — I2510 Atherosclerotic heart disease of native coronary artery without angina pectoris: Secondary | ICD-10-CM | POA: Diagnosis not present

## 2015-04-21 DIAGNOSIS — E785 Hyperlipidemia, unspecified: Secondary | ICD-10-CM | POA: Diagnosis not present

## 2015-04-21 DIAGNOSIS — N183 Chronic kidney disease, stage 3 unspecified: Secondary | ICD-10-CM

## 2015-04-21 DIAGNOSIS — I10 Essential (primary) hypertension: Secondary | ICD-10-CM | POA: Diagnosis not present

## 2015-04-21 DIAGNOSIS — E039 Hypothyroidism, unspecified: Secondary | ICD-10-CM | POA: Diagnosis not present

## 2015-04-21 DIAGNOSIS — Z0000 Encounter for general adult medical examination without abnormal findings: Secondary | ICD-10-CM

## 2015-04-21 DIAGNOSIS — F0391 Unspecified dementia with behavioral disturbance: Secondary | ICD-10-CM | POA: Diagnosis not present

## 2015-04-21 DIAGNOSIS — F03918 Unspecified dementia, unspecified severity, with other behavioral disturbance: Secondary | ICD-10-CM

## 2015-04-21 NOTE — Progress Notes (Signed)
Patient ID: Damon Miller, male   DOB: July 19, 1919, 82 y.o.   MRN: [id number]   Location: [facility] Provider: Hermenegildo Clausen L. Mariea Clonts, D.O., C.M.D.  Code Status: DNR Goals of Care: Advanced Directive information Does patient have an advance directive?: Yes, Type of Advance Directive: [place];Living will;Out of facility DNR (pink MOST or yellow form), Pre-existing out of facility DNR order (yellow form or pink MOST form): Yellow form placed in chart (order not valid for inpatient use)  Chief Complaint  Patient presents with  . Annual Exam    Wellness exam  . Medical Management of Chronic Issues    blood pressure, CKD, hyperglycemia    HPI: Patient is a 81 y.o. male seen in the office today for an annual wellness exam and med mgt of chronic diseases including dementia (not showering recently is a concern of staff), CKD, HTN, and hyperglycemia.    Depression screen [facility] 2/9 04/21/2015 10/06/2014 04/06/2014  Decreased Interest 0 0 0  Down, Depressed, Hopeless 0 0 0  PHQ - 2 Score 0 0 0    Fall Risk  04/21/2015 04/06/2014 04/06/2014  Falls in the past year? No Yes No  Number falls in past yr: - 1 -  Injury with Fall? - Yes -   MMSE - Mini Mental State Exam 01/26/2015 06/17/2014 09/15/2013 11/18/2012  Orientation to time 0 0 2 0  Orientation to Place 4 4 5 5   Registration 3 3 3 3   Attention/ Calculation 2 5 5 5   Recall 0 2 2 2   Language- name 2 objects 2 2 2 2   Language- repeat 1 1 1 1   Language- follow 3 step command 3 3 3 3   Language- read & follow direction 1 1 1 1   Write a sentence 1 1 1 1   Copy design 1 1 1 1   Total score 18 23 26 24   passed clock drawing in Sept  Functional Status Survey: Is the patient deaf or have difficulty hearing?: Yes Does the patient have difficulty seeing, even when wearing glasses/contacts?: No Does the patient have difficulty concentrating, remembering, or making decisions?: Yes Does the patient have difficulty walking or climbing  stairs?: Yes Does the patient have difficulty dressing or bathing?: Yes Does the patient have difficulty doing errands alone such as visiting a doctor's office or shopping?: Yes  Health Maintenance  Topic Date Due  . TETANUS/TDAP  04/17/1941  . INFLUENZA VACCINE  12/14/2015  . ZOSTAVAX  Completed  . PNA vac Low Risk Adult  Completed  Needs tetanus booster  Urinary incontinence?  He denies incontinence.  I don't see any nursing documentation recently suggesting otherwise.  Functional status?  Needs assistance with bathing, meds, direction to go to dinner, barber shop  Exercise? Walks with rolling walker but no directed exercise  Diet?  [facility] diet, no special diet  Vision:  Wears glasses, right 20/40, left 20/40, both 20/30  Hearing:  HOH, some wax in ears, but not severe  Dentition:  Has upper plate dentures, some fillings on bottom, no difficulty chewing or swallowing  Review of Systems:  Review of Systems  Constitutional: Negative for fever, chills and malaise/fatigue.  HENT: Positive for hearing loss. Negative for congestion.   Eyes: Negative for blurred vision.       Glasses  Gastrointestinal: Negative for abdominal pain.  Genitourinary: Negative for dysuria.  Musculoskeletal: Negative for falls.  Skin: Negative for rash.  Neurological: Negative for dizziness, loss of consciousness and weakness.  Psychiatric/Behavioral: Positive  for memory loss (behaviors with showering--yells and screams and wants to get out of here). The patient is nervous/anxious.     Past Medical History  Diagnosis Date  . Hyperlipemia   . Diverticulosis   . Prostate cancer ([place])   . Hypothyroid   . Vitamin D deficiency disease   . Cataracts, bilateral   . GERD (gastroesophageal reflux disease)   . Arthritis   . Hypertension   . DNR (do not resuscitate)   . CAD (coronary artery disease)   . Chronic kidney disease, stage II (mild)   . Abnormality of gait   . Glaucoma     Right eye    . Allergic rhinitis due to pollen   . Hernia, hiatal   . Gout, unspecified   . Hyperglycemia 2014    Past Surgical History  Procedure Laterality Date  . Colonoscopy  07/1990, 10/1992, 12/1995 and 02/1999    Polyps removed at each procedure  . Skin cancer excision  2004    Ears  . Eye surgery Bilateral 1999    Cataract removal   Family History  Problem Relation Age of Onset  . Heart attack Father    Social History   Social History  . Marital Status: Widowed    Spouse Name: N/A  . Number of Children: N/A  . Years of Education: N/A   Occupational History  . retired from [employer] History Main Topics  . Smoking status: Former Smoker    Quit date: 05/16/1987  . Smokeless tobacco: Never Used  . Alcohol Use: 3.0 oz/week    5 Shots of liquor per week     Comment: Drinks one [employer] couple nights a week  . Drug Use: No  . Sexual Activity: No   Other Topics Concern  . Not on file   Social History Narrative   Lives at [facility]   Widowed   DNR/POA/Living Will   Exercise: walking , plays bingo   Walks with walker   Stopped smoking 1989   Alcohol on [place] couple nights a week                  Allergies  Allergen Reactions  . Ibuprofen Rash      Medication List       This list is accurate as of: 04/21/15  3:02 PM.  Always use your most recent med list.               acetaminophen 325 MG tablet  Commonly known as:  TYLENOL  Take 650 mg by mouth every 6 (six) hours as needed. For pain     allopurinol 100 MG tablet  Commonly known as:  ZYLOPRIM  TAKE 1 TABLET ONCE DAILY TO LOWER URIC ACID.     aspirin EC 81 MG tablet  Take 1 tablet (81 mg total) by mouth daily.     atorvastatin 20 MG tablet  Commonly known as:  LIPITOR  TAKE 1 TABLET BY MOUTH EVERY DAY FOR CHOLESTEROL     cholecalciferol 1000 UNITS tablet  Commonly known as:  VITAMIN D  Take 2,000 Units by mouth daily.     clopidogrel 75 MG tablet  Commonly  known as:  PLAVIX  Take 75 mg by mouth daily with breakfast.     hydrocortisone cream 1 %  Apply 1 application topically. Apply to external hemorrhoids four times daily as needed     latanoprost 0.005 % ophthalmic solution  Commonly known as:  XALATAN  Place 1 drop into the right eye at bedtime.     levothyroxine 50 MCG tablet  Commonly known as:  SYNTHROID, LEVOTHROID  TAKE 1 TABLET BY MOUTH DAILY FOR THYROID SUPPLEMENT     LORazepam 0.5 MG tablet  Commonly known as:  ATIVAN  Take 0.25mg  prior to each shower twice weekly with direct supervision during personal care. On Sunday and Wednesday Additional tablet with additional shower day     losartan 25 MG tablet  Commonly known as:  COZAAR  Take 1 tablet (25 mg total) by mouth daily.     metoprolol succinate 25 MG 24 hr tablet  Commonly known as:  TOPROL XL  Take 1 tablet (25 mg total) by mouth daily.     NAMENDA XR 28 MG Cp24 24 hr capsule  Generic drug:  memantine  Take one daily for memory     nitroGLYCERIN 0.4 MG SL tablet  Commonly known as:  NITROSTAT  Place 1 tablet (0.4 mg total) under the tongue every 5 (five) minutes as needed for chest pain.        Physical Exam: Filed Vitals:   04/21/15 1442  BP: 142/82  Pulse: 66  Temp: 98.5 F (36.9 C)  TempSrc: Oral  Height: 5\' 8"  (1.727 m)  Weight: 175 lb (79.379 kg)  SpO2: 95%   Body mass index is 26.61 kg/(m^2). Physical Exam  Constitutional: He appears well-developed and well-nourished. No distress.  HENT:  Head: Normocephalic and atraumatic.  Right Ear: External ear normal.  Left Ear: External ear normal.  Nose: Nose normal.  Mouth/Throat: Oropharynx is clear and moist. No oropharyngeal exudate.  Increased cerumen bilaterally and HOH  Eyes: Conjunctivae and EOM are normal. Pupils are equal, round, and reactive to light.  Neck: Normal range of motion. Neck supple. No JVD present. No thyromegaly present.  Cardiovascular: Normal rate, regular rhythm, normal  heart sounds and intact distal pulses.   Bilateral LE edema 2+ of ankles; some chronic venous insufficiency hyperpigmentation changes  Pulmonary/Chest: Effort normal and breath sounds normal. No respiratory distress.  Abdominal: Soft. Bowel sounds are normal. He exhibits no distension and no mass. There is no tenderness.  Musculoskeletal: Normal range of motion. He exhibits no edema or tenderness.  Lymphadenopathy:    He has no cervical adenopathy.  Neurological: He is alert. He has normal reflexes. He exhibits normal muscle tone.  Oriented to person only; does not know why he's here  Skin: Skin is warm and dry.  Has body odor (last shower 12/4 per notes)  Psychiatric: He has a normal mood and affect.  Keeps saying he doesn't know why he's here    Labs reviewed: Basic Metabolic Panel:  Recent Labs  09/29/14 03/30/15  NA 138 139  K 4.3 4.3  BUN 26* 25*  CREATININE 1.3 1.3  TSH 1.86 2.09   Liver Function Tests:  Recent Labs  09/29/14 03/30/15  AST 16 15  ALT 16 11  ALKPHOS 67 60   No results for input(s): LIPASE, AMYLASE in the last 8760 hours. No results for input(s): AMMONIA in the last 8760 hours. CBC:  Recent Labs  03/30/15  WBC 6.9  HGB 12.2*  HCT 35*  PLT 197   Lipid Panel:  Recent Labs  09/29/14  CHOL 153  HDL 62  LDLCALC 75  TRIG 81   Lab Results  Component Value Date   HGBA1C 6.1* 03/30/2015    Assessment/Plan 1. Medicare annual wellness visit, subsequent -  up to date for his age except for needing tdap which is only given at pharmacy unless pt gets an injury to his skin where we can administer it here  2. Dementia with behavioral disturbance -is progressing -not showering or agitated when having it MMSE - Mini Mental State Exam 01/26/2015 06/17/2014 09/15/2013  Orientation to time 0 0 2  Orientation to Place 4 4 5   Registration 3 3 3   Attention/ Calculation 2 5 5   Recall 0 2 2  Language- name 2 objects 2 2 2   Language- repeat 1 1 1     Language- follow 3 step command 3 3 3   Language- read & follow direction 1 1 1   Write a sentence 1 1 1   Copy design 1 1 1   Total score 18 23 26   -refusing showers or screaming and yelling during them and cannot be reassured verbally at the time -has been getting ativan before the showers which isn't helping -perhaps a different staff member or different behavioral approach would help? -would like to avoid adding meds to his regimen -discussed with nurse manager and I decided to add zoloft 25mg  at bedtime to see if it helps his anxiety some  3. CKD (chronic kidney disease) stage 3, GFR 30-59 ml/min -cont to monitor, encourage hydration, avoid nsaids like ibuprofen, aleve, mobic  4. Essential hypertension -bp well controlled from his age and fall risk  5. Hyperglycemia - Lab Results  Component Value Date   HGBA1C 6.1* 03/30/2015  which is ok considering his age Is eating well and gaining weight  6. Coronary artery disease involving native coronary artery of native heart without angina pectoris -stable without chest pain or dyspnea, cont secondary prevention of mi with baby asa, lipitor, plavix, cozaar, toprol xl  7. Hypothyroidism, unspecified hypothyroidism type -cont synthroid -f/u tsh next draw (last was ok in may)  8. Hyperlipidemia -cont statin therapy, last LDL very close to goal at 75 -would not increase more in this 81 yo male with dementia and debility  Labs/tests ordered:  tsh next draw Next appt:  4 mos for med mgt  Damon Miller, D.O. [facility] [address], [place] [address] Cell Phone (Mon-Fri 8am-5pm):  [phone] On Call:  [phone] & follow prompts after 5pm & weekends Office Phone:  [phone] Office Fax:  [phone]         \

## 2015-04-21 NOTE — Progress Notes (Signed)
Patient ID: Damon Miller, male   DOB: [birth year], 81 y.o.   MRN: [id number] 01/26/15 MMSE 18/30 passed clock drawing

## 2015-04-22 DIAGNOSIS — E039 Hypothyroidism, unspecified: Secondary | ICD-10-CM | POA: Diagnosis not present

## 2015-07-06 DIAGNOSIS — L84 Corns and callosities: Secondary | ICD-10-CM | POA: Diagnosis not present

## 2015-07-06 DIAGNOSIS — M79671 Pain in right foot: Secondary | ICD-10-CM | POA: Diagnosis not present

## 2015-07-06 DIAGNOSIS — M79672 Pain in left foot: Secondary | ICD-10-CM | POA: Diagnosis not present

## 2015-07-06 DIAGNOSIS — B351 Tinea unguium: Secondary | ICD-10-CM | POA: Diagnosis not present

## 2015-07-07 DIAGNOSIS — N3001 Acute cystitis with hematuria: Secondary | ICD-10-CM | POA: Diagnosis not present

## 2015-07-08 DIAGNOSIS — N3001 Acute cystitis with hematuria: Secondary | ICD-10-CM | POA: Diagnosis not present

## 2015-07-13 DIAGNOSIS — Z0000 Encounter for general adult medical examination without abnormal findings: Secondary | ICD-10-CM | POA: Diagnosis not present

## 2015-07-13 DIAGNOSIS — N3001 Acute cystitis with hematuria: Secondary | ICD-10-CM | POA: Diagnosis not present

## 2015-07-13 DIAGNOSIS — R310 Gross hematuria: Secondary | ICD-10-CM | POA: Diagnosis not present

## 2015-07-13 DIAGNOSIS — R3129 Other microscopic hematuria: Secondary | ICD-10-CM | POA: Diagnosis not present

## 2015-07-26 DIAGNOSIS — R3129 Other microscopic hematuria: Secondary | ICD-10-CM | POA: Diagnosis not present

## 2015-07-26 DIAGNOSIS — R310 Gross hematuria: Secondary | ICD-10-CM | POA: Diagnosis not present

## 2015-08-18 ENCOUNTER — Non-Acute Institutional Stay: Payer: Medicare Other | Admitting: Internal Medicine

## 2015-08-18 ENCOUNTER — Encounter: Payer: Self-pay | Admitting: Internal Medicine

## 2015-08-18 VITALS — BP 150/82 | HR 72 | Temp 97.7°F | Resp 20 | Ht 69.0 in | Wt 184.8 lb

## 2015-08-18 DIAGNOSIS — N183 Chronic kidney disease, stage 3 unspecified: Secondary | ICD-10-CM

## 2015-08-18 DIAGNOSIS — R310 Gross hematuria: Secondary | ICD-10-CM | POA: Diagnosis not present

## 2015-08-18 DIAGNOSIS — Z8546 Personal history of malignant neoplasm of prostate: Secondary | ICD-10-CM

## 2015-08-18 DIAGNOSIS — N3001 Acute cystitis with hematuria: Secondary | ICD-10-CM

## 2015-08-18 DIAGNOSIS — F03918 Unspecified dementia, unspecified severity, with other behavioral disturbance: Secondary | ICD-10-CM

## 2015-08-18 DIAGNOSIS — F0391 Unspecified dementia with behavioral disturbance: Secondary | ICD-10-CM | POA: Diagnosis not present

## 2015-08-18 NOTE — Progress Notes (Signed)
Patient ID: Damon Miller, male   DOB: 11/18/1919, 82 y.o.   MRN: [id number]   Location:  [facility] (12)  Provider: Maelyn Berrey L. Mariea Clonts, D.O., C.M.D.  Code Status: DNR Goals of Care:  Advanced Directives 08/18/2015  Does patient have an advance directive? Yes  Type of Advance Directive [place]  Does patient want to make changes to advanced directive? No - Patient declined  Copy of advanced directive(s) in chart? Yes  Pre-existing out of facility DNR order (yellow form or pink MOST form) -     Chief Complaint  Patient presents with  . Medical Management of Chronic Issues    HPI: Patient is a 82 y.o. male seen today for medical management of chronic diseases.    He has no complaints.  Denies difficulty with sleep.    No dysuria, does still have gross hematuria despite UTI treatment with cipro as per nursing.  Denies bladder pain.  He denies weakness and fatigue.  Had CT abd/pelvis on 3/13 with trabeculation of his bladder with a 13mm enhancing lesion present in the right bladder dome.  Cystoscopy recommended (was done that day as well per [facility] nursing).  Also had renal cysts suspected to be benign.  He is now to see Dr. Louis Meckel at [facility] on 4/11 it seems for an official diagnosis and treatment plan.  His son has been attending appointments and also will go along to this one.    He is on both aspirin and plavix.    Walks with his walker.    Seeing ok. Wears glasses.  Has glaucoma drops.    No chest pains.  No shortness of breath.    Past Medical History  Diagnosis Date  . Hyperlipemia   . Diverticulosis   . Prostate cancer ([place])   . Hypothyroid   . Vitamin D deficiency disease   . Cataracts, bilateral   . GERD (gastroesophageal reflux disease)   . Arthritis   . Hypertension   . DNR (do not resuscitate)   . CAD (coronary artery disease)   . Chronic kidney disease, stage II (mild)   . Abnormality of gait    . Glaucoma     Right eye  . Allergic rhinitis due to pollen   . Hernia, hiatal   . Gout, unspecified   . Hyperglycemia 2014    Past Surgical History  Procedure Laterality Date  . Colonoscopy  07/1990, 10/1992, 12/1995 and 02/1999    Polyps removed at each procedure  . Skin cancer excision  2004    Ears  . Eye surgery Bilateral 1999    Cataract removal    Allergies  Allergen Reactions  . Ibuprofen Rash      Medication List       This list is accurate as of: 08/18/15 11:56 AM.  Always use your most recent med list.               acetaminophen 325 MG tablet  Commonly known as:  TYLENOL  Take 650 mg by mouth every 6 (six) hours as needed. For pain     allopurinol 100 MG tablet  Commonly known as:  ZYLOPRIM  TAKE 1 TABLET ONCE DAILY TO LOWER URIC ACID.     atorvastatin 20 MG tablet  Commonly known as:  LIPITOR  TAKE 1 TABLET BY MOUTH EVERY DAY FOR CHOLESTEROL     cholecalciferol 1000 units tablet  Commonly known as:  VITAMIN  D  Take 2,000 Units by mouth daily.     clopidogrel 75 MG tablet  Commonly known as:  PLAVIX  Take 75 mg by mouth daily with breakfast.     hydrocortisone cream 1 %  Apply 1 application topically. Apply to external hemorrhoids four times daily as needed     latanoprost 0.005 % ophthalmic solution  Commonly known as:  XALATAN  Place 1 drop into the right eye at bedtime.     levothyroxine 50 MCG tablet  Commonly known as:  SYNTHROID, LEVOTHROID  TAKE 1 TABLET BY MOUTH DAILY FOR THYROID SUPPLEMENT     LORazepam 0.5 MG tablet  Commonly known as:  ATIVAN  Take 0.25mg  prior to each shower twice weekly with direct supervision during personal care. On Sunday and Wednesday Additional tablet with additional shower day     losartan 25 MG tablet  Commonly known as:  COZAAR  Take 1 tablet (25 mg total) by mouth daily.     metoprolol succinate 25 MG 24 hr tablet  Commonly known as:  TOPROL XL  Take 1 tablet (25 mg total) by mouth daily.      NAMENDA XR 28 MG Cp24 24 hr capsule  Generic drug:  memantine  Take one daily for memory     nitroGLYCERIN 0.4 MG SL tablet  Commonly known as:  NITROSTAT  Place 1 tablet (0.4 mg total) under the tongue every 5 (five) minutes as needed for chest pain.        Review of Systems:  Review of Systems  Constitutional: Negative for fever, activity change, appetite change, fatigue and unexpected weight change.  HENT: Positive for hearing loss.   Eyes: Negative for visual disturbance.       Glasses, glaucoma  Respiratory: Negative for chest tightness and shortness of breath.   Cardiovascular: Positive for leg swelling. Negative for chest pain.       Wears TED hose  Gastrointestinal: Negative for abdominal pain.  Endocrine: Positive for cold intolerance.  Genitourinary: Positive for hematuria. Negative for dysuria, urgency, frequency, difficulty urinating and penile pain.  Musculoskeletal: Negative for back pain.       Walks with walker  Skin: Negative for color change and pallor.  Neurological: Negative for dizziness, weakness and light-headedness.  Psychiatric/Behavioral: Positive for confusion and agitation.       Has dementia, likely vascular type    Health Maintenance  Topic Date Due  . TETANUS/TDAP  04/17/1941  . INFLUENZA VACCINE  12/14/2015  . ZOSTAVAX  Completed  . PNA vac Low Risk Adult  Completed    Physical Exam: Filed Vitals:   08/18/15 1135  BP: 150/82  Pulse: 72  Temp: 97.7 F (36.5 C)  TempSrc: Oral  Resp: 20  Height: 5\' 9"  (1.753 m)  Weight: 184 lb 12.8 oz (83.825 kg)  SpO2: 99%   Body mass index is 27.28 kg/(m^2). Physical Exam  Constitutional: He appears well-developed and well-nourished. No distress.  Cardiovascular: Normal rate, regular rhythm, normal heart sounds and intact distal pulses.   Pulmonary/Chest: Effort normal and breath sounds normal. He has no rales.  Abdominal: Soft. Bowel sounds are normal. He exhibits no distension and no mass.  There is no tenderness. There is no rebound and no guarding.  Musculoskeletal: Normal range of motion.  Walks with walker  Neurological: He is alert.  Oriented to person, poor short term memory  Skin: Skin is warm and dry.  Psychiatric: He has a normal mood and affect.  Labs reviewed: Basic Metabolic Panel:  Recent Labs  09/29/14 03/30/15  NA 138 139  K 4.3 4.3  BUN 26* 25*  CREATININE 1.3 1.3  TSH 1.86 2.09   Liver Function Tests:  Recent Labs  09/29/14 03/30/15  AST 16 15  ALT 16 11  ALKPHOS 67 60   No results for input(s): LIPASE, AMYLASE in the last 8760 hours. No results for input(s): AMMONIA in the last 8760 hours. CBC:  Recent Labs  03/30/15  WBC 6.9  HGB 12.2*  HCT 35*  PLT 197   Lipid Panel:  Recent Labs  09/29/14  CHOL 153  HDL 62  LDLCALC 75  TRIG 81   Lab Results  Component Value Date   HGBA1C 6.1* 03/30/2015    Assessment/Plan 1. Gross hematuria -nursing reports that his urine continues to be bright red and opaque, not even close to yellow or clear -he has a bladder tumor noted on CT and cystoscopy -pt and son to see Dr. Louis Meckel 4/11 for more info, treatment plan -await notes on that 2. Acute cystitis with hematuria -treated with cipro, but hematuria not changing 3. History of prostate cancer -noted, s/p XRT 4. CKD (chronic kidney disease) stage 3, GFR 30-59 ml/min -cont to monitor creatinine, avoid nsaids and other nephrotoxic agents 5. Dementia with behavioral disturbance -does have poor short term memory--last mmse here in September last year was 18/30, passed his clock drawing  Labs/tests ordered: cbc with diff, bmp, flp, tsh before Next appt:  3 mos with labs before  Dazaria Macneill L. Zeinab Rodwell, D.O. [facility] [address], [place] [address] Cell Phone (Mon-Fri 8am-5pm):  [phone] On Call:  [phone] & follow prompts after 5pm & weekends Office Phone:   [phone] Office Fax:  [phone]

## 2015-08-24 DIAGNOSIS — R3129 Other microscopic hematuria: Secondary | ICD-10-CM | POA: Diagnosis not present

## 2015-08-24 DIAGNOSIS — Z0000 Encounter for general adult medical examination without abnormal findings: Secondary | ICD-10-CM | POA: Diagnosis not present

## 2015-08-24 DIAGNOSIS — R310 Gross hematuria: Secondary | ICD-10-CM | POA: Diagnosis not present

## 2015-09-22 ENCOUNTER — Non-Acute Institutional Stay: Payer: Medicare Other | Admitting: Internal Medicine

## 2015-09-22 ENCOUNTER — Encounter: Payer: Self-pay | Admitting: Internal Medicine

## 2015-09-22 VITALS — BP 148/68 | HR 74 | Temp 98.9°F | Wt 189.0 lb

## 2015-09-22 DIAGNOSIS — L080 Pyoderma: Secondary | ICD-10-CM | POA: Diagnosis not present

## 2015-09-22 DIAGNOSIS — R21 Rash and other nonspecific skin eruption: Secondary | ICD-10-CM

## 2015-09-22 NOTE — Progress Notes (Signed)
Location: [facility]  Clinic (12)  Provider: Artin Mceuen L. Mariea Clonts, D.O., C.M.D.  Code Status: DNR Goals of Care:  Advanced Directives 09/22/2015  Does patient have an advance directive? Yes  Type of [place] of [place];Out of facility DNR (pink MOST or yellow form)  Does patient want to make changes to advanced directive? -  Copy of advanced directive(s) in chart? Yes  Pre-existing out of facility DNR order (yellow form or pink MOST form) Yellow form placed in chart (order not valid for inpatient use)   Chief Complaint  Patient presents with  . Acute Visit    rash on chest and arms    HPI: Patient is a 82 y.o. male seen today for an acute visit for a new rash on his chest and arms.  None anywhere else, no mucus membranes.  Papular.  Pruritic.  Started last night. No recent med additions (off plavix and finished cipro).    No other complaints.  Past Medical History  Diagnosis Date  . Hyperlipemia   . Diverticulosis   . Prostate cancer ([place])   . Hypothyroid   . Vitamin D deficiency disease   . Cataracts, bilateral   . GERD (gastroesophageal reflux disease)   . Arthritis   . Hypertension   . DNR (do not resuscitate)   . CAD (coronary artery disease)   . Chronic kidney disease, stage II (mild)   . Abnormality of gait   . Glaucoma     Right eye  . Allergic rhinitis due to pollen   . Hernia, hiatal   . Gout, unspecified   . Hyperglycemia 2014    Past Surgical History  Procedure Laterality Date  . Colonoscopy  07/1990, 10/1992, 12/1995 and 02/1999    Polyps removed at each procedure  . Skin cancer excision  2004    Ears  . Eye surgery Bilateral 1999    Cataract removal    Allergies  Allergen Reactions  . Ibuprofen Rash      Medication List       This list is accurate as of: 09/22/15  1:46 PM.  Always use your most recent med list.               acetaminophen 325 MG tablet  Commonly known  as:  TYLENOL  Take 650 mg by mouth every 6 (six) hours as needed. For pain     allopurinol 100 MG tablet  Commonly known as:  ZYLOPRIM  TAKE 1 TABLET ONCE DAILY TO LOWER URIC ACID.     atorvastatin 20 MG tablet  Commonly known as:  LIPITOR  TAKE 1 TABLET BY MOUTH EVERY DAY FOR CHOLESTEROL     cholecalciferol 1000 units tablet  Commonly known as:  VITAMIN D  Take 2,000 Units by mouth daily.     hydrocortisone cream 1 %  Apply 1 application topically. Apply to external hemorrhoids four times daily as needed     latanoprost 0.005 % ophthalmic solution  Commonly known as:  XALATAN  Place 1 drop into the right eye at bedtime.     levothyroxine 50 MCG tablet  Commonly known as:  SYNTHROID, LEVOTHROID  TAKE 1 TABLET BY MOUTH DAILY FOR THYROID SUPPLEMENT     LORazepam 0.5 MG tablet  Commonly known as:  ATIVAN  Take 0.25mg  prior to each shower twice weekly with direct supervision during personal care. On Sunday and Wednesday Additional tablet with additional shower day  losartan 25 MG tablet  Commonly known as:  COZAAR  Take 1 tablet (25 mg total) by mouth daily.     metoprolol succinate 25 MG 24 hr tablet  Commonly known as:  TOPROL XL  Take 1 tablet (25 mg total) by mouth daily.     NAMENDA XR 28 MG Cp24 24 hr capsule  Generic drug:  memantine  Take one daily for memory     nitroGLYCERIN 0.4 MG SL tablet  Commonly known as:  NITROSTAT  Place 1 tablet (0.4 mg total) under the tongue every 5 (five) minutes as needed for chest pain.     sertraline 25 MG tablet  Commonly known as:  ZOLOFT        Review of Systems:  Review of Systems  Constitutional: Negative for fever and chills.  HENT: Negative for congestion and sore throat.   Eyes:       Glasses  Respiratory: Negative for cough.   Genitourinary: Positive for urgency, frequency and hematuria. Negative for dysuria and flank pain.  Skin: Positive for itching and rash.  Neurological: Negative for headaches.    Psychiatric/Behavioral: Positive for memory loss.    Health Maintenance  Topic Date Due  . TETANUS/TDAP  04/17/1941  . INFLUENZA VACCINE  12/14/2015  . ZOSTAVAX  Completed  . PNA vac Low Risk Adult  Completed    Physical Exam: Filed Vitals:   09/22/15 1339  BP: 148/68  Pulse: 74  Temp: 98.9 F (37.2 C)  TempSrc: Oral  Weight: 189 lb (85.73 kg)  SpO2: 96%   Body mass index is 27.9 kg/(m^2). Physical Exam  Constitutional: He appears well-developed and well-nourished.  Cardiovascular: Normal rate, regular rhythm, normal heart sounds and intact distal pulses.   Pulmonary/Chest: Effort normal and breath sounds normal. No respiratory distress.  Abdominal: Soft. Bowel sounds are normal. He exhibits no distension. There is no tenderness.  Musculoskeletal: Normal range of motion.  Walks with walker  Neurological: He is alert.  Skin: Rash noted.  Papular rash of upper chest and upper arms, none anywhere else; evidence of pruritis with excoriations, nontender  Psychiatric: He has a normal mood and affect.    Labs reviewed: Basic Metabolic Panel:  Recent Labs  09/29/14 03/30/15  NA 138 139  K 4.3 4.3  BUN 26* 25*  CREATININE 1.3 1.3  TSH 1.86 2.09   Liver Function Tests:  Recent Labs  09/29/14 03/30/15  AST 16 15  ALT 16 11  ALKPHOS 67 60   No results for input(s): LIPASE, AMYLASE in the last 8760 hours. No results for input(s): AMMONIA in the last 8760 hours. CBC:  Recent Labs  03/30/15  WBC 6.9  HGB 12.2*  HCT 35*  PLT 197   Lipid Panel:  Recent Labs  09/29/14  CHOL 153  HDL 62  LDLCALC 75  TRIG 81   Lab Results  Component Value Date   HGBA1C 6.1* 03/30/2015    Assessment/Plan 1. Papular rash, localized -hydrocortisone 2% cream to affected area three times a day until rash resolves -nursing to notify me if not getting better   Labs/tests ordered:  No new Next appt:  11/17/2015  Glena Pharris L. Antowan Samford, D.O. [facility] [address], [place] [address] Cell Phone (Mon-Fri 8am-5pm):  [phone] On Call:  [phone] & follow prompts after 5pm & weekends Office Phone:  [phone] Office Fax:  [phone]

## 2015-10-13 ENCOUNTER — Other Ambulatory Visit: Payer: Self-pay | Admitting: *Deleted

## 2015-10-13 MED ORDER — LORAZEPAM 0.5 MG PO TABS: ORAL_TABLET | ORAL | Status: DC

## 2015-10-13 NOTE — Telephone Encounter (Signed)
[facility]

## 2015-11-09 DIAGNOSIS — B351 Tinea unguium: Secondary | ICD-10-CM | POA: Diagnosis not present

## 2015-11-09 DIAGNOSIS — L84 Corns and callosities: Secondary | ICD-10-CM | POA: Diagnosis not present

## 2015-11-09 DIAGNOSIS — M79671 Pain in right foot: Secondary | ICD-10-CM | POA: Diagnosis not present

## 2015-11-09 DIAGNOSIS — M79672 Pain in left foot: Secondary | ICD-10-CM | POA: Diagnosis not present

## 2015-11-15 DIAGNOSIS — E039 Hypothyroidism, unspecified: Secondary | ICD-10-CM | POA: Diagnosis not present

## 2015-11-15 DIAGNOSIS — E785 Hyperlipidemia, unspecified: Secondary | ICD-10-CM | POA: Diagnosis not present

## 2015-11-15 DIAGNOSIS — N183 Chronic kidney disease, stage 3 (moderate): Secondary | ICD-10-CM | POA: Diagnosis not present

## 2015-11-15 LAB — BASIC METABOLIC PANEL
BUN: 26 mg/dL — AB (ref 4–21)
Creatinine: 1.2 mg/dL (ref 0.6–1.3)
Glucose: 88 mg/dL
Potassium: 4.3 mmol/L (ref 3.4–5.3)
Sodium: 140 mmol/L (ref 137–147)

## 2015-11-15 LAB — LIPID PANEL
Cholesterol: 143 mg/dL (ref 0–200)
HDL: 62 mg/dL (ref 35–70)
LDL Cholesterol: 67 mg/dL
Triglycerides: 67 mg/dL (ref 40–160)

## 2015-11-15 LAB — CBC AND DIFFERENTIAL
HCT: 37 % — AB (ref 41–53)
Hemoglobin: 11.6 g/dL — AB (ref 13.5–17.5)
Platelets: 200 10*3/uL (ref 150–399)
WBC: 5.8 10^3/mL

## 2015-11-15 LAB — TSH: TSH: 1.39 u[IU]/mL (ref 0.41–5.90)

## 2015-11-17 ENCOUNTER — Non-Acute Institutional Stay: Payer: Medicare Other | Admitting: Internal Medicine

## 2015-11-17 ENCOUNTER — Encounter: Payer: Self-pay | Admitting: Internal Medicine

## 2015-11-17 VITALS — BP 148/60 | HR 65 | Temp 98.8°F | Ht 69.0 in | Wt 188.0 lb

## 2015-11-17 DIAGNOSIS — I10 Essential (primary) hypertension: Secondary | ICD-10-CM

## 2015-11-17 DIAGNOSIS — F0391 Unspecified dementia with behavioral disturbance: Secondary | ICD-10-CM

## 2015-11-17 DIAGNOSIS — N183 Chronic kidney disease, stage 3 unspecified: Secondary | ICD-10-CM

## 2015-11-17 DIAGNOSIS — R310 Gross hematuria: Secondary | ICD-10-CM

## 2015-11-17 DIAGNOSIS — C674 Malignant neoplasm of posterior wall of bladder: Secondary | ICD-10-CM | POA: Diagnosis not present

## 2015-11-17 DIAGNOSIS — R739 Hyperglycemia, unspecified: Secondary | ICD-10-CM

## 2015-11-17 DIAGNOSIS — F03918 Unspecified dementia, unspecified severity, with other behavioral disturbance: Secondary | ICD-10-CM

## 2015-11-17 DIAGNOSIS — E039 Hypothyroidism, unspecified: Secondary | ICD-10-CM

## 2015-11-17 DIAGNOSIS — I2510 Atherosclerotic heart disease of native coronary artery without angina pectoris: Secondary | ICD-10-CM | POA: Diagnosis not present

## 2015-11-17 NOTE — Progress Notes (Signed)
Location:   [place] of Service:  Clinic (12)  Provider: Krystena Reitter L. Mariea Clonts, D.O., C.M.D.  Code Status: DNR Goals of Care:  Advanced Directives 11/17/2015  Does patient have an advance directive? Yes  Type of Advance Directive Out of facility DNR (pink MOST or yellow form);Healthcare Power of Attorney  Copy of advanced directive(s) in chart? Yes  Pre-existing out of facility DNR order (yellow form or pink MOST form) Yellow form placed in chart (order not valid for inpatient use)     Chief Complaint  Patient presents with  . Medical Management of Chronic Issues    3 mth follow-up    HPI: Patient is a 82 y.o. male seen today for medical management of chronic diseases.    His rash from May resolved with the steroid cream.  His hematuria has been gone since April when his plavix and asa were discontinued.  He had seen Dr. Louis Meckel who advised surgery for the tumor removal if he and his family wanted it (tumor on right posterior bladder wall near dome with two smaller masses in bladder, as well).  They have decided against surgery.  He sees him again in Oct in f/u.    Past Medical History  Diagnosis Date  . Hyperlipemia   . Diverticulosis   . Prostate cancer ([place])   . Hypothyroid   . Vitamin D deficiency disease   . Cataracts, bilateral   . GERD (gastroesophageal reflux disease)   . Arthritis   . Hypertension   . DNR (do not resuscitate)   . CAD (coronary artery disease)   . Chronic kidney disease, stage II (mild)   . Abnormality of gait   . Glaucoma     Right eye  . Allergic rhinitis due to pollen   . Hernia, hiatal   . Gout, unspecified   . Hyperglycemia 2014    Past Surgical History  Procedure Laterality Date  . Colonoscopy  07/1990, 10/1992, 12/1995 and 02/1999    Polyps removed at each procedure  . Skin cancer excision  2004    Ears  . Eye surgery Bilateral 1999    Cataract removal    Allergies  Allergen Reactions  . Ibuprofen Rash        Medication List       This list is accurate as of: 11/17/15 11:02 AM.  Always use your most recent med list.               acetaminophen 325 MG tablet  Commonly known as:  TYLENOL  Take 650 mg by mouth every 6 (six) hours as needed. For pain     allopurinol 100 MG tablet  Commonly known as:  ZYLOPRIM  TAKE 1 TABLET ONCE DAILY TO LOWER URIC ACID.     atorvastatin 20 MG tablet  Commonly known as:  LIPITOR  TAKE 1 TABLET BY MOUTH EVERY DAY FOR CHOLESTEROL     cholecalciferol 1000 units tablet  Commonly known as:  VITAMIN D  Take 2,000 Units by mouth daily.     hydrocortisone 2.5 % cream  Apply 1 application topically 4 (four) times daily as needed.     latanoprost 0.005 % ophthalmic solution  Commonly known as:  XALATAN  Place 1 drop into the right eye at bedtime.     levothyroxine 50 MCG tablet  Commonly known as:  SYNTHROID, LEVOTHROID  TAKE 1 TABLET BY MOUTH DAILY FOR THYROID SUPPLEMENT     LORazepam 0.5 MG tablet  Commonly  known as:  ATIVAN  Take 1/2 tablet by mouth as an additional dose on shower days as needed     losartan 25 MG tablet  Commonly known as:  COZAAR  Take 1 tablet (25 mg total) by mouth daily.     metoprolol succinate 25 MG 24 hr tablet  Commonly known as:  TOPROL XL  Take 1 tablet (25 mg total) by mouth daily.     NAMENDA XR 28 MG Cp24 24 hr capsule  Generic drug:  memantine  Take one daily for memory     nitroGLYCERIN 0.4 MG SL tablet  Commonly known as:  NITROSTAT  Place 1 tablet (0.4 mg total) under the tongue every 5 (five) minutes as needed for chest pain.     sertraline 25 MG tablet  Commonly known as:  ZOLOFT       Review of Systems:  Review of Systems  Unable to perform ROS Constitutional: Negative for fever and chills.  HENT: Positive for hearing loss. Negative for congestion.   Eyes: Positive for blurred vision.       Glaucoma  Respiratory: Negative for cough and shortness of breath.   Cardiovascular: Positive for leg  swelling. Negative for chest pain and palpitations.       Wears compression hose  Gastrointestinal: Negative for abdominal pain, constipation, blood in stool and melena.  Genitourinary: Positive for frequency. Negative for dysuria, urgency and hematuria.       Hematuria resolved in April when blood thinners stopped  Musculoskeletal: Negative for myalgias, back pain, joint pain and falls.       Walks with walker  Skin: Negative for itching and rash.       Resolved in may  Neurological: Negative for dizziness and loss of consciousness.  Endo/Heme/Allergies: Bruises/bleeds easily.  Psychiatric/Behavioral: Positive for memory loss. Negative for depression. The patient is not nervous/anxious.     Health Maintenance  Topic Date Due  . TETANUS/TDAP  04/17/1941  . INFLUENZA VACCINE  12/14/2015  . ZOSTAVAX  Completed  . PNA vac Low Risk Adult  Completed    Physical Exam: Filed Vitals:   11/17/15 1058  BP: 148/60  Pulse: 65  Temp: 98.8 F (37.1 C)  TempSrc: Oral  Height: 5\' 9"  (1.753 m)  Weight: 188 lb (85.276 kg)  SpO2: 97%   Body mass index is 27.75 kg/(m^2). Physical Exam  Constitutional: He is oriented to person, place, and time. He appears well-developed and well-nourished. No distress.  Cardiovascular: Normal rate, regular rhythm, normal heart sounds and intact distal pulses.   Pulmonary/Chest: Effort normal and breath sounds normal. No respiratory distress.  Abdominal: Soft. Bowel sounds are normal. He exhibits no distension and no mass. There is no tenderness.  Musculoskeletal: Normal range of motion.  Walks with rolling walker with skis  Neurological: He is alert and oriented to person, place, and time.  Skin: Skin is warm and dry.  rhinophyma  Psychiatric: He has a normal mood and affect.    Labs reviewed: Basic Metabolic Panel:  Recent Labs  03/30/15 11/15/15 0333  NA 139 140  K 4.3 4.3  BUN 25* 26*  CREATININE 1.3 1.2  TSH 2.09 1.39   Liver Function  Tests:  Recent Labs  03/30/15  AST 15  ALT 11  ALKPHOS 60   No results for input(s): LIPASE, AMYLASE in the last 8760 hours. No results for input(s): AMMONIA in the last 8760 hours. CBC:  Recent Labs  03/30/15 11/15/15 0333  WBC 6.9 5.8  HGB 12.2* 11.6*  HCT 35* 37*  PLT 197 200   Lipid Panel:  Recent Labs  11/15/15 0333  CHOL 143  HDL 62  LDLCALC 67  TRIG 67   Lab Results  Component Value Date   HGBA1C 6.1* 03/30/2015    Assessment/Plan 1. Gross hematuria -has not had this since April by report -due to malignant neoplasm of bladder--h/h stable 2 days ago at 11.6 -off plavix and asa now  2. 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 Louis Meckel in October for possible mitomycin C -elected not to have surgery   3. CKD (chronic kidney disease) stage 3, GFR 30-59 ml/min -stable renal function with last cr 1.25 this month  4. Dementia with behavioral disturbance -has short term memory loss -last mmse was 18/30 - does dress himself, but selects the same clothes frequently, able to feed himself and use restroom on his own  5. Essential hypertension -bp slightly up this am after walking over from AL, but still less than 150/90 in 82 yo pt  6. Hyperglycemia -last hba1c was in diabetic range, will recheck before next appt in Oct  7. Hypothyroidism, unspecified hypothyroidism type -cont current synthroid  8. Coronary artery disease involving native coronary artery of native heart without angina pectoris -no chest pains, cont secondary prevention, but no aggressive interventions  Labs/tests ordered:  Hba1c, cbc before next visit Next appt:  F/u 3 mos med mgt, labs before  Embree Brawley L. Kimble Hitchens, D.O. [facility] [address], [place] [address] Cell Phone (Mon-Fri 8am-5pm):  [phone] On Call:  [phone] & follow prompts after 5pm & weekends Office Phone:   [phone] Office Fax:  [phone]

## 2016-01-05 ENCOUNTER — Encounter: Payer: Medicare Other | Admitting: Internal Medicine

## 2016-02-10 DIAGNOSIS — C670 Malignant neoplasm of trigone of bladder: Secondary | ICD-10-CM | POA: Diagnosis not present

## 2016-02-10 DIAGNOSIS — C671 Malignant neoplasm of dome of bladder: Secondary | ICD-10-CM | POA: Diagnosis not present

## 2016-02-11 ENCOUNTER — Telehealth: Payer: Self-pay | Admitting: *Deleted

## 2016-02-11 NOTE — Telephone Encounter (Signed)
Received Fax from [facility] for Request for [place] for Transurethral Resection of Bladder Tumor Bilateral Retrograde Pyelograms instill mitomycin C.  Placed form in [employer] at [employer] for Appointment on 02/16/2016 at [facility].

## 2016-02-14 DIAGNOSIS — E1365 Other specified diabetes mellitus with hyperglycemia: Secondary | ICD-10-CM | POA: Diagnosis not present

## 2016-02-14 DIAGNOSIS — D509 Iron deficiency anemia, unspecified: Secondary | ICD-10-CM | POA: Diagnosis not present

## 2016-02-14 LAB — CBC AND DIFFERENTIAL
HCT: 42 % (ref 41–53)
Hemoglobin: 13.1 g/dL — AB (ref 13.5–17.5)
Platelets: 229 10*3/uL (ref 150–399)
WBC: 6.7 10^3/mL

## 2016-02-14 LAB — HEMOGLOBIN A1C: Hemoglobin A1C: 6.1

## 2016-02-15 ENCOUNTER — Encounter: Payer: Self-pay | Admitting: Internal Medicine

## 2016-02-16 ENCOUNTER — Non-Acute Institutional Stay: Payer: Medicare Other | Admitting: Internal Medicine

## 2016-02-16 ENCOUNTER — Encounter: Payer: Self-pay | Admitting: Internal Medicine

## 2016-02-16 VITALS — BP 140/60 | HR 75 | Temp 98.8°F | Wt 192.0 lb

## 2016-02-16 DIAGNOSIS — I10 Essential (primary) hypertension: Secondary | ICD-10-CM | POA: Diagnosis not present

## 2016-02-16 DIAGNOSIS — Z01818 Encounter for other preprocedural examination: Secondary | ICD-10-CM | POA: Diagnosis not present

## 2016-02-16 DIAGNOSIS — N183 Chronic kidney disease, stage 3 unspecified: Secondary | ICD-10-CM

## 2016-02-16 DIAGNOSIS — G301 Alzheimer's disease with late onset: Secondary | ICD-10-CM | POA: Diagnosis not present

## 2016-02-16 DIAGNOSIS — C674 Malignant neoplasm of posterior wall of bladder: Secondary | ICD-10-CM | POA: Diagnosis not present

## 2016-02-16 DIAGNOSIS — F02818 Dementia in other diseases classified elsewhere, unspecified severity, with other behavioral disturbance: Secondary | ICD-10-CM

## 2016-02-16 DIAGNOSIS — R739 Hyperglycemia, unspecified: Secondary | ICD-10-CM

## 2016-02-16 DIAGNOSIS — I2510 Atherosclerotic heart disease of native coronary artery without angina pectoris: Secondary | ICD-10-CM

## 2016-02-16 DIAGNOSIS — F0281 Dementia in other diseases classified elsewhere with behavioral disturbance: Secondary | ICD-10-CM

## 2016-02-16 NOTE — Progress Notes (Signed)
Location:  Occupational psychologist of Service:  Clinic (12)  Provider: Noelie Renfrow L. Mariea Clonts, D.O., C.M.D.  Code Status: DNR Goals of Care:  Advanced Directives 02/16/2016  Does patient have an advance directive? Yes  Type of Advance Directive Out of facility DNR (pink MOST or yellow form);Healthcare Power of Attorney  Does patient want to make changes to advanced directive? -  Copy of advanced directive(s) in chart? Yes  Pre-existing out of facility DNR order (yellow form or pink MOST form) Yellow form placed in chart (order not valid for inpatient use)     Chief Complaint  Patient presents with  . Medical Management of Chronic Issues    3 mth follow-up  . Medical Clearance    Urology     HPI: Patient is a 82 y.o. male with a h/o dementia (very pleasant and appropriate, lives in assisted living,  MMSE was 18/30 in 9/16, on namenda XR only), CAD, prior prostate ca, arthritis, gait disorder (uses walker), htn, hyperlipidemia, hypothyroidism and known bladder cancer with hematuria seen today for medical management of chronic diseases and clearance for his upcoming urologic surgery.   He and his family have decided to go ahead with having surgery to address his bladder cancer--he is to have transurethral resection of bladder tumor, bilateral retrograde pyelograms with instilled mitomycin C by Dr. Louis Meckel.    They have requested my note from today's visit.    Pt denies any symptoms of anything whatsoever.  He does have urinary frequency and had to urinate during his time at the clinic.    Past Medical History:  Diagnosis Date  . Abnormality of gait   . Allergic rhinitis due to pollen   . Arthritis   . CAD (coronary artery disease)   . Cataracts, bilateral   . Chronic kidney disease, stage II (mild)   . Diverticulosis   . DNR (do not resuscitate)   . GERD (gastroesophageal reflux disease)   . Glaucoma    Right eye  . Gout, unspecified   . Hernia, hiatal   .  Hyperglycemia 2014  . Hyperlipemia   . Hypertension   . Hypothyroid   . Prostate cancer ([place])   . Vitamin D deficiency disease     Past Surgical History:  Procedure Laterality Date  . COLONOSCOPY  07/1990, 10/1992, 12/1995 and 02/1999   Polyps removed at each procedure  . EYE SURGERY Bilateral 1999   Cataract removal  . SKIN CANCER EXCISION  2004   Ears    Allergies  Allergen Reactions  . Ibuprofen Rash      Medication List       Accurate as of 02/16/16  9:59 AM. Always use your most recent med list.          acetaminophen 325 MG tablet Commonly known as:  TYLENOL Take 650 mg by mouth every 6 (six) hours as needed. For pain   allopurinol 100 MG tablet Commonly known as:  ZYLOPRIM TAKE 1 TABLET ONCE DAILY TO LOWER URIC ACID.   atorvastatin 20 MG tablet Commonly known as:  LIPITOR TAKE 1 TABLET BY MOUTH EVERY DAY FOR CHOLESTEROL   cholecalciferol 1000 units tablet Commonly known as:  VITAMIN D Take 2,000 Units by mouth daily.   hydrocortisone 2.5 % cream Apply 1 application topically 4 (four) times daily as needed.   latanoprost 0.005 % ophthalmic solution Commonly known as:  XALATAN Place 1 drop into the right eye at bedtime.   levothyroxine 50 MCG  tablet Commonly known as:  SYNTHROID, LEVOTHROID TAKE 1 TABLET BY MOUTH DAILY FOR THYROID SUPPLEMENT   LORazepam 0.5 MG tablet Commonly known as:  ATIVAN Take 1/2 tablet by mouth as an additional dose on shower days as needed   losartan 25 MG tablet Commonly known as:  COZAAR Take 1 tablet (25 mg total) by mouth daily.   metoprolol succinate 25 MG 24 hr tablet Commonly known as:  TOPROL XL Take 1 tablet (25 mg total) by mouth daily.   NAMENDA XR 28 MG Cp24 24 hr capsule Generic drug:  memantine Take one daily for memory   nitroGLYCERIN 0.4 MG SL tablet Commonly known as:  NITROSTAT Place 1 tablet (0.4 mg total) under the tongue every 5 (five) minutes as needed for chest pain.   sertraline 25 MG  tablet Commonly known as:  ZOLOFT       Review of Systems:  Review of Systems  Constitutional: Negative for chills, fever and malaise/fatigue.  HENT: Positive for hearing loss. Negative for congestion.   Eyes: Negative for blurred vision.       Glasses  Respiratory: Negative for cough, shortness of breath and wheezing.   Cardiovascular: Negative for chest pain, palpitations and leg swelling.       Wears compression hose  Gastrointestinal: Negative for abdominal pain, blood in stool, constipation and melena.  Genitourinary: Positive for frequency. Negative for dysuria, flank pain, hematuria and urgency.       Historically has had hematuria  Musculoskeletal: Negative for falls and joint pain.  Skin: Negative for itching and rash.  Neurological: Negative for dizziness, loss of consciousness and weakness.  Endo/Heme/Allergies: Bruises/bleeds easily.  Psychiatric/Behavioral: Positive for memory loss. Negative for depression. The patient is not nervous/anxious and does not have insomnia.     Health Maintenance  Topic Date Due  . TETANUS/TDAP  04/17/1939  . INFLUENZA VACCINE  12/14/2015  . ZOSTAVAX  Completed  . PNA vac Low Risk Adult  Completed    Physical Exam: Vitals:   02/16/16 0930  BP: 140/60  Pulse: 75  Temp: 98.8 F (37.1 C)  TempSrc: Oral  SpO2: 98%  Weight: 192 lb (87.1 kg)   Body mass index is 28.35 kg/m. Physical Exam  Constitutional: He appears well-developed and well-nourished. No distress.  HENT:  Head: Normocephalic and atraumatic.  Eyes: EOM are normal. Pupils are equal, round, and reactive to light.  glasses  Neck: Neck supple.  Cardiovascular: Normal rate, regular rhythm, normal heart sounds and intact distal pulses.   No murmur heard. Pulmonary/Chest: Effort normal and breath sounds normal. No respiratory distress. He has no wheezes. He has no rales.  Abdominal: Soft. Bowel sounds are normal. He exhibits no distension and no mass. There is no  tenderness. There is no rebound and no guarding.  Musculoskeletal: Normal range of motion.  Walks with walker  Neurological: He is alert.  Oriented to person, place ([facility]), not time  Skin: Skin is warm and dry. Capillary refill takes less than 2 seconds.  Psychiatric: He has a normal mood and affect.  Very pleasant    Labs reviewed: Basic Metabolic Panel:  Recent Labs  03/30/15 11/15/15 0333  NA 139 140  K 4.3 4.3  BUN 25* 26*  CREATININE 1.3 1.2  TSH 2.09 1.39   Liver Function Tests:  Recent Labs  03/30/15  AST 15  ALT 11  ALKPHOS 60   No results for input(s): LIPASE, AMYLASE in the last 8760 hours. No results for input(s): AMMONIA  in the last 8760 hours. CBC:  Recent Labs  03/30/15 11/15/15 0333 02/14/16 0800  WBC 6.9 5.8 6.7  HGB 12.2* 11.6* 13.1*  HCT 35* 37* 42  PLT 197 200 229   Lipid Panel:  Recent Labs  11/15/15 0333  CHOL 143  HDL 62  LDLCALC 67  TRIG 67   Lab Results  Component Value Date   HGBA1C 6.1 02/14/2016    Assessment/Plan 1. Malignant neoplasm of posterior wall of urinary bladder (HCC) -for tumor resection, imaging and mitomycin -has urinary frequency and did have significant hematuria at one point  2. CKD (chronic kidney disease) stage 3, GFR 30-59 ml/min -has been stable, just had full labs before this visit -avoid nsaids and nephrotoxic agents, encourage hydration  3. Late onset Alzheimer's disease with behavioral disturbance -moderate stages, is still performing his adls, ambulatory with walker, gets help with meds and some cueing with his hygiene -monitor for delirium in the postop period -may also have a vascular component considering CAD  4. Essential hypertension -bp well controlled on regimen above and w/o dizziness  5. Coronary artery disease involving native coronary artery of native heart without angina pectoris -with ischemic cardiomyopathy, EF was 40-45% in 2014 post MI -continues on secondary  preventive meds -EKG today:  Sinus at 63bpm, poor R wave progression  And flattened T waves in V5/V6, but unchanged from 05/2012 (except tremor resulted in wavy baseline now)  6. Hyperglycemia -last hba1c remained in prediabetic range -eats WS diet, doing fine  Labs/tests ordered:  EKG Next appt:  3 mos med mgt  Kaylana Fenstermacher L. Brexlee Heberlein, D.O. [facility] [address], [place] [address] Cell Phone (Mon-Fri 8am-5pm):  [phone] On Call:  [phone] & follow prompts after 5pm & weekends Office Phone:  [phone] Office Fax:  [phone]

## 2016-02-17 ENCOUNTER — Other Ambulatory Visit: Payer: Self-pay | Admitting: Urology

## 2016-02-17 NOTE — Progress Notes (Signed)
Pt is being scheduled for preop apt; please place surgical orders in epic. Thanks.

## 2016-02-18 ENCOUNTER — Encounter (HOSPITAL_COMMUNITY): Payer: Self-pay

## 2016-02-21 ENCOUNTER — Encounter (HOSPITAL_COMMUNITY): Payer: Self-pay

## 2016-02-21 NOTE — Progress Notes (Signed)
HEMAGLOBIN A1C 02-14-16 EPIC CBC AND DIF 02-14-16 EPIC MEDICAL CLEARNCE NOTE DR REED ON CHART FOR 02-24-16 SURGERY

## 2016-02-21 NOTE — Patient Instructions (Addendum)
Damon Miller  02/21/2016   Your procedure is scheduled on: 02-24-16  Report to [facility] Main  Entrance take [facility]  elevators to 3rd floor to  [place] at 830 AM.  Call this number if you have problems the morning of surgery [phone]   Remember: ONLY 1 PERSON MAY GO WITH YOU TO SHORT STAY TO GET  READY MORNING OF [place].  Do not eat food or drink liquids :After Midnight.  PLEASE SEND COPY OF LATEST MEDICATION RECORD AND LAST TIME PATIENT ATE AND DRANK WITH PATIENT DAY OF SURGERY   Take these medicines the morning of surgery with A SIP OF WATER: ALLOPURINOL, LEVOTHYROXINE (SYNTHROID), METOPROLOL SUCCINATE                               You may not have any metal on your body including hair pins and              piercings  Do not wear jewelry, make-up, lotions, powders or perfumes, deodorant             Do not wear nail polish.  Do not shave  48 hours prior to surgery.              Men may shave face and neck.   Do not bring valuables to the hospital. [place].  Contacts, dentures or bridgework may not be worn into surgery.  Leave suitcase in the car. After surgery it may be brought to your room.     Patients discharged the day of surgery will not be allowed to drive home.  Name and phone number of your driver: [facility] to provide transportation [phone]  Special Instructions: N/A              Please read over the following fact sheets you were given: _____________________________________________________________________             [facility] - Preparing for Surgery Before surgery, you can play an important role.  Because skin is not sterile, your skin needs to be as free of germs as possible.  You can reduce the number of germs on your skin by washing with CHG (chlorahexidine gluconate) soap before surgery.  CHG is an antiseptic cleaner which kills germs and bonds with the skin to  continue killing germs even after washing. Please DO NOT use if you have an allergy to CHG or antibacterial soaps.  If your skin becomes reddened/irritated stop using the CHG and inform your nurse when you arrive at Short Stay. Do not shave (including legs and underarms) for at least 48 hours prior to the first CHG shower.  You may shave your face/neck. Please follow these instructions carefully:  1.  Shower with CHG Soap the night before surgery and the  morning of Surgery.  2.  If you choose to wash your hair, wash your hair first as usual with your  normal  shampoo.  3.  After you shampoo, rinse your hair and body thoroughly to remove the  shampoo.                           4.  Use CHG as you would  any other liquid soap.  You can apply chg directly  to the skin and wash                       Gently with a scrungie or clean washcloth.  5.  Apply the CHG Soap to your body ONLY FROM THE NECK DOWN.   Do not use on face/ open                           Wound or open sores. Avoid contact with eyes, ears mouth and genitals (private parts).                       Wash face,  Genitals (private parts) with your normal soap.             6.  Wash thoroughly, paying special attention to the area where your surgery  will be performed.  7.  Thoroughly rinse your body with warm water from the neck down.  8.  DO NOT shower/wash with your normal soap after using and rinsing off  the CHG Soap.                9.  Pat yourself dry with a clean towel.            10.  Wear clean pajamas.            11.  Place clean sheets on your bed the night of your first shower and do not  sleep with pets. Day of Surgery : Do not apply any lotions/deodorants the morning of surgery.  Please wear clean clothes to the hospital/surgery center.  FAILURE TO FOLLOW THESE INSTRUCTIONS MAY RESULT IN THE CANCELLATION OF YOUR SURGERY PATIENT SIGNATURE_________________________________  NURSE  SIGNATURE__________________________________  ________________________________________________________________________

## 2016-02-22 ENCOUNTER — Encounter (HOSPITAL_COMMUNITY): Payer: Self-pay

## 2016-02-22 ENCOUNTER — Encounter (HOSPITAL_COMMUNITY)
Admission: RE | Admit: 2016-02-22 | Discharge: 2016-02-22 | Disposition: A | Discharge status: Home / Self Care | Payer: Medicare Other | Source: Ambulatory Visit | Attending: Urology | Admitting: Urology

## 2016-02-22 DIAGNOSIS — Z01810 Encounter for preprocedural cardiovascular examination: Secondary | ICD-10-CM | POA: Insufficient documentation

## 2016-02-22 DIAGNOSIS — Z01812 Encounter for preprocedural laboratory examination: Secondary | ICD-10-CM | POA: Diagnosis not present

## 2016-02-22 HISTORY — DX: Unspecified dementia, unspecified severity, without behavioral disturbance, psychotic disturbance, mood disturbance, and anxiety: F03.90

## 2016-02-22 HISTORY — DX: Edema, unspecified: R60.9

## 2016-02-22 LAB — BASIC METABOLIC PANEL
Anion gap: 8 (ref 5–15)
BUN: 27 mg/dL — ABNORMAL HIGH (ref 6–20)
CHLORIDE: 101 mmol/L (ref 101–111)
CO2: 28 mmol/L (ref 22–32)
CREATININE: 1.44 mg/dL — AB (ref 0.61–1.24)
Calcium: 8.8 mg/dL — ABNORMAL LOW (ref 8.9–10.3)
GFR calc non Af Amer: 40 mL/min — ABNORMAL LOW (ref >60)
GFR, EST AFRICAN AMERICAN: 47 mL/min — AB (ref >60)
Glucose, Bld: 131 mg/dL — ABNORMAL HIGH (ref 65–99)
Potassium: 4.3 mmol/L (ref 3.5–5.1)
Sodium: 137 mmol/L (ref 135–145)

## 2016-02-22 NOTE — Progress Notes (Signed)
FAXED COPY ON PRE OP INSTRUCTIONS TO [facility] [place] FAX NUMBER [phone] FAX CONFIRMATION RECEIVED AND PLACED ON CHART, ALSO SENT COPY OF PRE OP INSTRUCTIONS AND CHLOROHEXADINE SOAP WITH PATIENT AND CAREGIVER BACK TO FACILITY . LEFT MESSAGE WITH SON WAT [place] TO RETURN CALL TO MAKE SURE HE IS COMING AM OF PROCEDURE AT 830 AM, BETH NURSE AT [facility] STATED HE IS COMING TO SIGN CONSENTS THEN

## 2016-02-22 NOTE — Progress Notes (Signed)
MADE DR Meriel Pica OF 02-22-16 EKG RESULTS AND 06-01-12 EKG RESULTS AND PT MEDICAL HISTORY, PT OK FOR SURGERY PER DR GERMEROTH ANESTHESIA.

## 2016-02-23 NOTE — Progress Notes (Signed)
bmet results faxed to dr Louis Meckel by epic

## 2016-02-23 NOTE — Progress Notes (Signed)
Spoke with son wats key poa by phone and [facility] will send healthcare poa and he will be here at 57 am [facility] short stay to sign consent for surgery

## 2016-02-23 NOTE — Progress Notes (Signed)
Left message for health care poa wat [place] son at cell number to call back and tell this rn if he is coming 02-24-16 830 am [facility] short stay to sign consent, unable to leave messahe at home number voice mail full.

## 2016-02-24 ENCOUNTER — Ambulatory Visit (HOSPITAL_COMMUNITY): Payer: Medicare Other | Admitting: Certified Registered Nurse Anesthetist

## 2016-02-24 ENCOUNTER — Ambulatory Visit (HOSPITAL_COMMUNITY)
Admission: RE | Admit: 2016-02-24 | Discharge: 2016-02-24 | Disposition: A | Discharge status: Skilled Nursing Facility | Payer: Medicare Other | Source: Ambulatory Visit | Attending: Urology | Admitting: Urology

## 2016-02-24 ENCOUNTER — Encounter (HOSPITAL_COMMUNITY)
Admission: RE | Disposition: A | Discharge status: Skilled Nursing Facility | Payer: Self-pay | Source: Ambulatory Visit | Attending: Urology

## 2016-02-24 ENCOUNTER — Encounter (HOSPITAL_COMMUNITY): Payer: Self-pay | Admitting: *Deleted

## 2016-02-24 DIAGNOSIS — C671 Malignant neoplasm of dome of bladder: Secondary | ICD-10-CM

## 2016-02-24 DIAGNOSIS — I2510 Atherosclerotic heart disease of native coronary artery without angina pectoris: Secondary | ICD-10-CM | POA: Insufficient documentation

## 2016-02-24 DIAGNOSIS — C674 Malignant neoplasm of posterior wall of bladder: Secondary | ICD-10-CM | POA: Diagnosis not present

## 2016-02-24 DIAGNOSIS — N281 Cyst of kidney, acquired: Secondary | ICD-10-CM | POA: Diagnosis not present

## 2016-02-24 DIAGNOSIS — I10 Essential (primary) hypertension: Secondary | ICD-10-CM | POA: Diagnosis not present

## 2016-02-24 DIAGNOSIS — Z87891 Personal history of nicotine dependence: Secondary | ICD-10-CM | POA: Insufficient documentation

## 2016-02-24 DIAGNOSIS — Z79899 Other long term (current) drug therapy: Secondary | ICD-10-CM | POA: Insufficient documentation

## 2016-02-24 DIAGNOSIS — D303 Benign neoplasm of bladder: Secondary | ICD-10-CM | POA: Diagnosis not present

## 2016-02-24 DIAGNOSIS — E039 Hypothyroidism, unspecified: Secondary | ICD-10-CM | POA: Insufficient documentation

## 2016-02-24 DIAGNOSIS — C679 Malignant neoplasm of bladder, unspecified: Secondary | ICD-10-CM | POA: Diagnosis not present

## 2016-02-24 DIAGNOSIS — F0390 Unspecified dementia without behavioral disturbance: Secondary | ICD-10-CM | POA: Insufficient documentation

## 2016-02-24 DIAGNOSIS — K219 Gastro-esophageal reflux disease without esophagitis: Secondary | ICD-10-CM | POA: Diagnosis not present

## 2016-02-24 DIAGNOSIS — K449 Diaphragmatic hernia without obstruction or gangrene: Secondary | ICD-10-CM | POA: Diagnosis not present

## 2016-02-24 DIAGNOSIS — M1990 Unspecified osteoarthritis, unspecified site: Secondary | ICD-10-CM | POA: Insufficient documentation

## 2016-02-24 HISTORY — PX: TRANSURETHRAL RESECTION OF BLADDER TUMOR: SHX2575

## 2016-02-24 SURGERY — TURBT (TRANSURETHRAL RESECTION OF BLADDER TUMOR)
Anesthesia: General

## 2016-02-24 MED ORDER — PROPOFOL 500 MG/50ML IV EMUL
INTRAVENOUS | Status: DC | PRN
  Administered 2016-02-24: 50 ug/kg/min via INTRAVENOUS

## 2016-02-24 MED ORDER — SODIUM CHLORIDE 0.9 % IR SOLN
Status: DC | PRN
  Administered 2016-02-24: 12000 mL

## 2016-02-24 MED ORDER — PROPOFOL 10 MG/ML IV BOLUS
INTRAVENOUS | Status: AC
  Filled 2016-02-24: qty 20

## 2016-02-24 MED ORDER — TRAMADOL HCL 50 MG PO TABS
50.0000 mg | ORAL_TABLET | Freq: Once | ORAL | Status: AC
Start: 2016-02-24 — End: 2016-02-24
  Administered 2016-02-24: 50 mg via ORAL
  Filled 2016-02-24: qty 1

## 2016-02-24 MED ORDER — FENTANYL CITRATE (PF) 100 MCG/2ML IJ SOLN
INTRAMUSCULAR | Status: DC | PRN
  Administered 2016-02-24: 25 ug via INTRAVENOUS

## 2016-02-24 MED ORDER — ONDANSETRON HCL 4 MG/2ML IJ SOLN
INTRAMUSCULAR | Status: DC | PRN
  Administered 2016-02-24: 4 mg via INTRAVENOUS

## 2016-02-24 MED ORDER — CIPROFLOXACIN IN D5W 400 MG/200ML IV SOLN
INTRAVENOUS | Status: AC
  Filled 2016-02-24: qty 200

## 2016-02-24 MED ORDER — EPHEDRINE SULFATE-NACL 50-0.9 MG/10ML-% IV SOSY
PREFILLED_SYRINGE | INTRAVENOUS | Status: DC | PRN
  Administered 2016-02-24 (×2): 5 mg via INTRAVENOUS

## 2016-02-24 MED ORDER — MITOMYCIN CHEMO FOR BLADDER INSTILLATION 40 MG
40.0000 mg | Freq: Once | INTRAVENOUS | Status: DC
  Filled 2016-02-24: qty 40

## 2016-02-24 MED ORDER — CIPROFLOXACIN IN D5W 400 MG/200ML IV SOLN
400.0000 mg | INTRAVENOUS | Status: AC
  Administered 2016-02-24 (×2): 400 mg via INTRAVENOUS

## 2016-02-24 MED ORDER — FENTANYL CITRATE (PF) 100 MCG/2ML IJ SOLN
INTRAMUSCULAR | Status: AC
  Filled 2016-02-24: qty 2

## 2016-02-24 MED ORDER — PHENAZOPYRIDINE HCL 200 MG PO TABS: 200.0000 mg | ORAL_TABLET | Freq: Three times a day (TID) | ORAL | 0 refills | Status: DC | PRN

## 2016-02-24 MED ORDER — PROPOFOL 10 MG/ML IV BOLUS
INTRAVENOUS | Status: DC | PRN
  Administered 2016-02-24: 10 mg via INTRAVENOUS
  Administered 2016-02-24 (×2): 20 mg via INTRAVENOUS
  Administered 2016-02-24 (×2): 10 mg via INTRAVENOUS
  Administered 2016-02-24: 20 mg via INTRAVENOUS
  Administered 2016-02-24: 10 mg via INTRAVENOUS

## 2016-02-24 MED ORDER — IOHEXOL 300 MG/ML  SOLN
INTRAMUSCULAR | Status: DC | PRN
  Administered 2016-02-24: 24 mL

## 2016-02-24 MED ORDER — LACTATED RINGERS IV SOLN
INTRAVENOUS | Status: DC
  Administered 2016-02-24: 1000 mL via INTRAVENOUS

## 2016-02-24 MED ORDER — TRAMADOL HCL 50 MG PO TABS: 50.0000 mg | ORAL_TABLET | Freq: Four times a day (QID) | ORAL | 0 refills | Status: DC | PRN

## 2016-02-24 MED ORDER — FENTANYL CITRATE (PF) 100 MCG/2ML IJ SOLN: 25.0000 ug | INTRAMUSCULAR | Status: DC | PRN

## 2016-02-24 SURGICAL SUPPLY — 22 items
BAG URINE DRAINAGE (UROLOGICAL SUPPLIES) IMPLANT
BAG URO CATCHER STRL LF (MISCELLANEOUS) ×3 IMPLANT
CATH FOLEY 2WAY SLVR 30CC 24FR (CATHETERS) IMPLANT
CATH URET 5FR 28IN OPEN ENDED (CATHETERS) ×3 IMPLANT
ELECT REM PT RETURN 9FT ADLT (ELECTROSURGICAL) ×3
ELECTRODE REM PT RTRN 9FT ADLT (ELECTROSURGICAL) ×1 IMPLANT
EVACUATOR MICROVAS BLADDER (UROLOGICAL SUPPLIES) IMPLANT
GLOVE BIOGEL M 8.0 STRL (GLOVE) IMPLANT
GOWN STRL REUS W/ TWL XL LVL3 (GOWN DISPOSABLE) ×1 IMPLANT
GOWN STRL REUS W/TWL XL LVL3 (GOWN DISPOSABLE) ×2 IMPLANT
GUIDEWIRE STR DUAL SENSOR (WIRE) ×3 IMPLANT
LOOP CUT BIPOLAR 24F LRG (ELECTROSURGICAL) IMPLANT
MANIFOLD NEPTUNE II (INSTRUMENTS) ×3 IMPLANT
NDL SAFETY ECLIPSE 18X1.5 (NEEDLE) IMPLANT
NEEDLE HYPO 18GX1.5 SHARP (NEEDLE)
NS IRRIG 1000ML POUR BTL (IV SOLUTION) ×3 IMPLANT
PACK CYSTO (CUSTOM PROCEDURE TRAY) ×3 IMPLANT
SET ASPIRATION TUBING (TUBING) IMPLANT
SYRINGE IRR TOOMEY STRL 70CC (SYRINGE) ×3 IMPLANT
TUBING CONNECTING 10 (TUBING) ×2 IMPLANT
TUBING CONNECTING 10' (TUBING) ×1
WATER STERILE IRR 3000ML UROMA (IV SOLUTION) IMPLANT

## 2016-02-24 NOTE — Transfer of Care (Signed)
Immediate Anesthesia Transfer of Care Note  Patient: Damon Miller  Procedure(s) Performed: Procedure(s): TRANSURETHRAL RESECTION OF BLADDER TUMOR (TURBT), BILATERAL RETROGRADE POLYGRAM (N/A)  Patient Location: PACU  Anesthesia Type:MAC  Level of Consciousness: awake, alert  and oriented  Airway & Oxygen Therapy: Patient Spontanous Breathing and Patient connected to face mask oxygen  Post-op Assessment: Report given to RN and Post -op Vital signs reviewed and stable  Post vital signs: Reviewed and stable  Last Vitals:  Vitals:   02/24/16 0833  BP: (!) 152/66  Pulse: 66  Resp: 16  Temp: 37.1 C    Last Pain:  Vitals:   02/24/16 0833  TempSrc: Oral      Patients Stated Pain Goal: 3 (88/33/74 4514)  Complications: No apparent anesthesia complications

## 2016-02-24 NOTE — Discharge Instructions (Signed)
Transurethral Resection, Bladder Tumor A cancerous growth (tumor) can develop on the inside wall of the bladder. The bladder is the organ that holds urine. One way to remove the tumor is a procedure called a transurethral resection. The tumor is removed (resected) through the tube that carries urine from the bladder out of the body (urethra). No cuts (incisions) are made in the skin. Instead, the procedure is done through a thin telescope, called a resectoscope. Attached to it is a light and usually a tiny camera. The resectoscope is put into the urethra. In men, the urethra opens at the end of the penis. In women, it opens just above the vagina.  A transurethral resection is usually used to remove tumors that have not gotten too big or too deep. These are called Stage 0, Stage 1 or Stage 2 bladder cancers. LET YOUR CAREGIVER KNOW ABOUT:  On the day of the procedure, your caregivers will need to know the last time you had anything to eat or drink. This includes water, gum, and candy. In advance, make sure they know about:   Any allergies.  All medications you are taking, including:  Herbs, eyedrops, over-the-counter medications and creams.  Blood thinners (anticoagulants), aspirin or other drugs that could affect blood clotting.  Use of steroids (by mouth or as creams).  Previous problems with anesthetics, including local anesthetics.  Possibility of pregnancy, if this applies.  Any history of blood clots.  Any history of bleeding or other blood problems.  Previous surgery.  Smoking history.  Any recent symptoms of colds or infections.  Other health problems. RISKS AND COMPLICATIONS This is usually a safe procedure. Every procedure has risks, though. For a transurethral resection, they include:  Infection. Antibiotic medication would need to be taken.  Bleeding.  Light bleeding may last for several days after the procedure.  If bleeding continues or is heavy, the bladder may  need rinsing. Or, a new catheter might be put in for awhile.  Sometimes bed rest is needed.  Urination problems.  Pain and burning can occur when urinating. This usually goes away in a few days.  Scarring from the procedure can block the flow of urine.  Bladder damage.  It can be punctured or torn during removal of the tumor. If this happens, a catheter might be needed for longer. Antibiotics would be taken while the bladder heals.  Urine can leak through the hole or tear into the abdomen. If this happens, surgery may be needed to repair the bladder. BEFORE THE PROCEDURE   A medical evaluation will be done. This may include:  A physical examination.  Urine test. This is to make sure you do not have a urinary tract infection.  Blood tests.  A test that checks the heart's rhythm (electrocardiogram).  Talking with an anesthesiologist. This is the person who will be in charge of the medication (anesthesia) to keep you from feeling pain during the transurethral resection. You might be asleep during the procedure (general anesthesia) or numb from the waist down, but awake during the procedure (spinal anesthesia). Ask your surgeon what to expect.  The person who is having a transurethral resection needs to give what is called informed consent. This requires signing a legal paper that gives permission for the procedure. To give informed consent:  You must understand how the procedure is done and why.  You must be told all the risks and benefits of the procedure.  You must sign the consent. Sometimes a legal guardian  can do this.  Signing should be witnessed by a healthcare professional.  The day before the surgery, eat only a light dinner. Then, do not eat or drink anything for at least 8 hours before the surgery. Ask your caregiver if it is OK to take any needed medicines with a sip of water.  Arrive at least an hour before the surgery or whenever your surgeon recommends. This will  give you time to check in and fill out any needed paperwork. PROCEDURE  The preparation:  You will change into a hospital gown.  A needle will be inserted in your arm. This is an intravenous access tube (IV). Medication will be able to flow directly into your body through this needle.  Small monitors will be put on your body. They are used to check your heart, blood pressure, and oxygen level.  You might be given medication that will help you relax (sedative).  You will be given a general anesthetic or spinal anesthesia.  The procedure:  Once you are asleep or numb from the waist down, your legs will be placed in stirrups.  The resectoscope will be passed through the urethra into the bladder.  Fluid will be passed through the resectoscope. This will fill the bladder with water.  The surgeon will examine the bladder through the scope. If the scope has a camera, it can take pictures from inside the bladder. They can be projected onto a TV screen.  The surgeon will use various tools to remove the tumor in small pieces. Sometimes a laser (a beam of light energy) is used. Other tools may use electric current.  A tube (catheter) will often be placed so that urine can drain into a bag outside the body. This process helps stop bleeding. This tube keeps blood clots from blocking the urethra.  The procedure usually takes 30 to 45 minutes. AFTER THE PROCEDURE   You will stay in a recovery area until the anesthesia has worn off. Your blood pressure and pulse will be checked every so often. Then you will be taken to a hospital room.  You may continue to get fluids through the IV for awhile.  Some pain is normal. The catheter might be uncomfortable. Pain is usually not severe. If it is, ask for pain medicine.  Your urine may look bloody after a transurethral resection. This is normal.  If bleeding is heavy, a hospital caregiver may rinse out the bladder (irrigation) through the  catheter.  Once the urine is clear, the catheter will be taken out.  You will need to stay in the hospital until you can urinate on your own.  Most people stay in the hospital for up to 4 days. PROGNOSIS   Transurethral resection is considered the best way to treat bladder tumors that are not too far along. For most people, the treatment is successful. Sometimes, though, more treatment is needed.  Bladder cancers can come back even after a successful procedure. Because of this, be sure to have a checkup with your caregiver every 3 to 6 months. If everything is OK for 3 years, you can reduce the checkups to once a year.   This information is not intended to replace advice given to you by your health care provider. Make sure you discuss any questions you have with your health care provider.   Document Released: 02/25/2009 Document Revised: 07/24/2011 Document Reviewed: 05/03/2009 Elsevier Interactive Patient Education 2016 [employer]    Transurethral Resection of Bladder Tumor (TURBT) or  Bladder Biopsy   Definition:  Transurethral Resection of the Bladder Tumor is a surgical procedure used to diagnose and remove tumors within the bladder. TURBT is the most common treatment for early stage bladder cancer.  General instructions:     Your recent bladder surgery requires very little post hospital care but some definite precautions.  Despite the fact that no skin incisions were used, the area around the bladder incisions are raw and covered with scabs to promote healing and prevent bleeding. Certain precautions are needed to insure that the scabs are not disturbed over the next 2-4 weeks while the healing proceeds.  Because the raw surface inside your bladder and the irritating effects of urine you may expect frequency of urination and/or urgency (a stronger desire to urinate) and perhaps even getting up at night more often. This will usually resolve or improve slowly over the healing  period. You may see some blood in your urine over the first 6 weeks. Do not be alarmed, even if the urine was clear for a while. Get off your feet and drink lots of fluids until clearing occurs. If you start to pass clots or don't improve call us.  Diet:  You may return to your normal diet immediately. Because of the raw surface of your bladder, alcohol, spicy foods, foods high in acid and drinks with caffeine may cause irritation or frequency and should be used in moderation. To keep your urine flowing freely and avoid constipation, drink plenty of fluids during the day (8-10 glasses). Tip: Avoid cranberry juice because it is very acidic.  Activity:  Your physical activity doesn't need to be restricted. However, if you are very active, you may see some blood in the urine. We suggest that you reduce your activity under the circumstances until the bleeding has stopped.  Bowels:  It is important to keep your bowels regular during the postoperative period. Straining with bowel movements can cause bleeding. A bowel movement every other day is reasonable. Use a mild laxative if needed, such as milk of magnesia 2-3 tablespoons, or 2 Dulcolax tablets. Call if you continue to have problems. If you had been taking narcotics for pain, before, during or after your surgery, you may be constipated. Take a laxative if necessary.    Medication:  You should resume your pre-surgery medications unless told not to. In addition you may be given an antibiotic to prevent or treat infection. Antibiotics are not always necessary. All medication should be taken as prescribed until the bottles are finished unless you are having an unusual reaction to one of the drugs.

## 2016-02-24 NOTE — H&P (Addendum)
f/u for transitional cell carcinoma  HPI: Damon Miller is a 82 year-old male established patient who is here surveillance of bladder cancer.  He did not undergo a TURBT.   The patient did not have any post-operative bladder instillations.   His last cysto was 08/24/2015. His cystoscopy demonstrated 1.5cm papillary lesion at dome of bladder, 2 satellite lesions.   His last radiologic test to evaluate the kidneys was 08/10/2015. Imaging results: CT - renal cysts. The patient had labs prior to his office visit today. Pertinent labs: Creatinine 1.4.   He is not having pain in new locations. He has not had blood in his urine recently. He has not recently had unwanted weight loss.   Presents 6 months after last being seen and diagnosed with bladder tumors. Decision by family was to hold off on surgery.   No changes since he was last seen. Plavix has been stopped. No on-going hematuria.     ALLERGIES: Ibuprofen TABS    MEDICATIONS: Levothyroxine Sodium 50 mcg tablet  Acetaminophen 325 mg tablet  Allopurinol TABS Oral  Ativan 0.5 mg tablet  Ativan TABS Oral  Atorvastatin Calcium 20 mg tablet  Hydrocortisone 2.5 % cream  Lipitor 20 MG Oral Tablet Oral  Losartan Potassium 25 MG Oral Tablet Oral  Metoprolol Succinate ER TB24 Oral  Namenda 10 MG Oral Tablet Oral  Nitrostat 0.4 MG Sublingual Tablet Sublingual Sublingual  Synthroid 50 MCG Oral Tablet Oral  Vitamin D  Vitamin D TABS Oral  Xalatan SOLN Ophthalmic  Zoloft TABS Oral     GU PSH: None   NON-GU PSH: None   GU PMH: Gross hematuria, Gross hematuria - 07/26/2015 Other microscopic hematuria, Microhematuria - 07/13/2015 Personal Hx Malig Neo Unspec, History of malignant neoplasm - 07/13/2015    NON-GU PMH: Encounter for general adult medical examination without abnormal findings, Encounter for preventive health examination - 08/24/2015 Personal history of other diseases of the circulatory system, History of cardiac disorder -  07/13/2015 Personal history of other endocrine, nutritional and metabolic disease, History of hyperglycemia - 07/13/2015 Unspecified dementia without behavioral disturbance, Dementia - 07/13/2015    FAMILY HISTORY: No pertinent family history - Runs In Family   SOCIAL HISTORY: None    Notes: Former smoker, Retired, Mother deceased, Caffeine use, Alcohol use, Father deceased   REVIEW OF SYSTEMS:    GU Review Male:   Patient denies frequent urination, hard to postpone urination, burning/ pain with urination, get up at night to urinate, leakage of urine, stream starts and stops, trouble starting your stream, have to strain to urinate , erection problems, and penile pain.  Gastrointestinal (Upper):   Patient denies nausea, vomiting, and indigestion/ heartburn.  Gastrointestinal (Lower):   Patient denies diarrhea and constipation.  Constitutional:   Patient denies fever, fatigue, night sweats, and weight loss.  Skin:   Patient denies skin rash/ lesion and itching.  Eyes:   Patient denies blurred vision and double vision.  Ears/ Nose/ Throat:   Patient denies sore throat and sinus problems.  Hematologic/Lymphatic:   Patient denies swollen glands and easy bruising.  Cardiovascular:   Patient denies leg swelling and chest pains.  Respiratory:   Patient denies cough and shortness of breath.  Endocrine:   Patient denies excessive thirst.  Musculoskeletal:   Patient denies back pain and joint pain.  Neurological:   Patient denies headaches and dizziness.  Psychologic:   Patient denies depression and anxiety.   VITAL SIGNS:      02/10/2016 01:47 PM  Weight 188 lb / 85.28 kg  Height 69 in / 175.26 cm  BP 168/73 mmHg  Pulse 81 /min  BMI 27.8 kg/m   MULTI-SYSTEM PHYSICAL EXAMINATION:    Constitutional: Well-nourished. No physical deformities. Normally developed. Good grooming.  Neck: Neck symmetrical, not swollen. Normal tracheal position.  Respiratory: No labored breathing, no use of accessory  muscles.   Cardiovascular: Normal temperature, normal extremity pulses, no swelling, no varicosities.  Lymphatic: No enlargement of neck, axillae, groin.  Skin: No paleness, no jaundice, no cyanosis. No lesion, no ulcer, no rash.  Neurologic / Psychiatric: Oriented to time, oriented to place, oriented to person. No depression, no anxiety, no agitation.  Gastrointestinal: No mass, no tenderness, no rigidity, non obese abdomen.  Eyes: Normal conjunctivae. Normal eyelids.  Ears, Nose, Mouth, and Throat: Left ear no scars, no lesions, no masses. Right ear no scars, no lesions, no masses. Nose no scars, no lesions, no masses. Normal hearing. Normal lips.  Musculoskeletal: Normal gait and station of head and neck.     PAST DATA REVIEWED:  Source Of History:  Patient  Records Review:   Previous Patient Records   01/04/06 11/20/02  PSA  Total PSA 0.38  0.58     PROCEDURES:          Urinalysis - 81003 Dipstick Dipstick Cont'd  Specimen: Voided Bilirubin: Neg  Color: Yellow Ketones: Neg  Appearance: Clear Blood: Neg  Specific Gravity: 1.015 Protein: Neg  pH: 6.0 Urobilinogen: 0.2  Glucose: Neg Nitrites: Neg    Leukocyte Esterase: Neg    ASSESSMENT:      ICD-10 Details  1 GU:   Bladder Cancer Trigone - C67.0    PLAN:           Orders Labs Urine Culture and Sensitivity          Document Letter(s):  Created for Patient: Clinical Summary         Notes:   The patient's family has elected to proceed with transurethral resection of bladder tumor. This was based on previous discussions. Initially, they were planning to do it sooner although had some difficulty with other family sickness at the time. However, at this point they're ready to proceed. The patient looks to be in relatively good shape. However, he will need clearance from his primary care provider prior. Our plan is to do a transurethral resection of bladder tumor with bilateral retrograde Pyelogram and postoperative  mitomycin-C. I answered all the patient and his son's questions. They opted to proceed, we will try to get this scheduled next to form.

## 2016-02-24 NOTE — Op Note (Signed)
Preoperative diagnosis:  1. Gross hematuria 2. Bladder tumor, posterior bladder wall, 2 cm in size   Postoperative diagnosis:  1. Same  Procedure: 1. Cystoscopy, retrograde pyelogram with interpretation bilaterally 2. TURBT 2 cm   Surgeon: Ardis Hughs, MD  Anesthesia: Mac  Complications: None  Intraoperative findings:   #1: The patient's right retrograde pyelogram was performed by instilling 10 cc of Omnipaque contrast into the right ureteral orifice. It demonstrated a narrow UVJ with proximal dilation for several centimeters and then a normal-appearing mid and proximal ureter with no filling defects and sharp calyces. The patient's left retropyelogram was performed in a similar fashion and again demonstrated a narrowed trachea are all ureter with proximal dilation for several centimeters and then a normal-appearing mid and proximal ureter with sharp calyces and no filling defects. #2: The patient's bladder was heavily trabeculated. The ureters were in orthotopic position. The prostate was obstructing with a high median bar. The tumor was located in the posterior bladder wall and measuring approximately 2 cm. #3: Mitomycin C was not given in the postoperative recovery area given the patient's thin bladder wall and concern for small microperforation.  EBL: Minimal  Specimens: Bladder tumor, posterior bladder wall  Indication: Damon Miller is a 82 y.o. patient who presented initially with gross hematuria. Workup for this revealed bladder tumor in the posterior bladder wall..  After reviewing the management options for treatment, he elected to proceed with the above surgical procedure(s). We have discussed the potential benefits and risks of the procedure, side effects of the proposed treatment, the likelihood of the patient achieving the goals of the procedure, and any potential problems that might occur during the procedure or recuperation. Informed consent has been  obtained.  Description of procedure:  The patient was taken to the operating room and general anesthesia was induced.  The patient was placed in the dorsal lithotomy position, prepped and draped in the usual sterile fashion, and preoperative antibiotics were administered. A preoperative time-out was performed.   30-degree 21 French cystoscope which I tested the patient to return in the bladder. Secondary degree lens was then exchanged for a 30 lens and a 360 cystoscopic evaluation was performed. The above findings were noted. The 70 lens was then re-exchanged for the 30 lens and retrograde pyelograms were performed with the above findings. Next, I attempted to resect the tumor using the biopsy forceps. Given the amount tumor I opted to exchange the biopsy forceps for the resectoscope.  After dilating the patient's urethral meatus to 67 [place] and was able to pass the 26 French resectoscope sheath into the patient's bladder under visual guidance using the obturator. The x-ray was exchange for the loop cautery. The tumor was then systematically resected in the base and edges of the resection copiously fulgurated. During no additional lesions noted on reinspection of the patient's prior. The bladder chips were then removed. I then irrigated the patient's bladder with sterile water. Hemostasis was noted to be excellent. The scope was then removed.  Examination anesthesia demonstrated a normal-sized prostate without nodularity and no additional masses.   The patient was subsequently awoken and returned to the PACU in stable condition.   Ardis Hughs, M.D.

## 2016-02-24 NOTE — Anesthesia Postprocedure Evaluation (Signed)
Anesthesia Post Note  Patient: Damon Miller  Procedure(s) Performed: Procedure(s) (LRB): TRANSURETHRAL RESECTION OF BLADDER TUMOR (TURBT), BILATERAL RETROGRADE POLYGRAM (N/A)  Patient location during evaluation: PACU Anesthesia Type: MAC Level of consciousness: awake and alert Pain management: pain level controlled Vital Signs Assessment: post-procedure vital signs reviewed and stable Respiratory status: spontaneous breathing, nonlabored ventilation, respiratory function stable and patient connected to nasal cannula oxygen Cardiovascular status: stable and blood pressure returned to baseline Anesthetic complications: no    Last Vitals:  Vitals:   02/24/16 1241 02/24/16 1347  BP: (!) 157/77 (!) 162/63  Pulse: 64 (!) 58  Resp: 16 16  Temp: 36.7 C 36.8 C    Last Pain:  Vitals:   02/24/16 1347  TempSrc: Oral  PainSc: [place]

## 2016-02-24 NOTE — Anesthesia Preprocedure Evaluation (Addendum)
Anesthesia Evaluation  Patient identified by MRN, date of birth, ID band Patient awake    Reviewed: Allergy & Precautions, NPO status , Patient's Chart, lab work & pertinent test results, reviewed documented beta blocker date and time   Airway Mallampati: II  TM Distance: >3 FB Neck ROM: Full    Dental  (+) Edentulous Upper, Edentulous Lower   Pulmonary former smoker,    breath sounds clear to auscultation       Cardiovascular hypertension, Pt. on medications and Pt. on home beta blockers + CAD   Rhythm:Regular Rate:Normal     Neuro/Psych PSYCHIATRIC DISORDERS  Neuromuscular disease    GI/Hepatic Neg liver ROS, hiatal hernia, GERD  Medicated,  Endo/Other  Hypothyroidism   Renal/GU CRFRenal disease  negative genitourinary   Musculoskeletal  (+) Arthritis , Osteoarthritis,    Abdominal   Peds negative pediatric ROS (+)  Hematology negative hematology ROS (+)   Anesthesia Other Findings   Reproductive/Obstetrics                            Anesthesia Physical Anesthesia Plan  ASA: III  Anesthesia Plan: MAC   Post-op Pain Management:    Induction: Intravenous  Airway Management Planned: Natural Airway  Additional Equipment:   Intra-op Plan:   Post-operative Plan:   Informed Consent: I have reviewed the patients History and Physical, chart, labs and discussed the procedure including the risks, benefits and alternatives for the proposed anesthesia with the patient or authorized representative who has indicated his/her understanding and acceptance.     Plan Discussed with: CRNA  Anesthesia Plan Comments: (Will convert to GA if necessary, pt consented.  DNR will be suspended intraop and in PACU. Suspension discussed with patient and son, they are agreeable. )       Anesthesia Quick Evaluation

## 2016-02-24 NOTE — Progress Notes (Signed)
Report called to Jan, nurse at [facility]. Written discharge instructions given to son and he will give to nurses at the home

## 2016-03-16 DIAGNOSIS — C674 Malignant neoplasm of posterior wall of bladder: Secondary | ICD-10-CM | POA: Diagnosis not present

## 2016-05-17 ENCOUNTER — Encounter: Payer: Medicare Other | Admitting: Internal Medicine

## 2016-06-20 DIAGNOSIS — B351 Tinea unguium: Secondary | ICD-10-CM | POA: Diagnosis not present

## 2016-06-20 DIAGNOSIS — M79672 Pain in left foot: Secondary | ICD-10-CM | POA: Diagnosis not present

## 2016-06-20 DIAGNOSIS — M79671 Pain in right foot: Secondary | ICD-10-CM | POA: Diagnosis not present

## 2016-06-20 DIAGNOSIS — L84 Corns and callosities: Secondary | ICD-10-CM | POA: Diagnosis not present

## 2016-06-27 DIAGNOSIS — C674 Malignant neoplasm of posterior wall of bladder: Secondary | ICD-10-CM | POA: Diagnosis not present

## 2016-09-18 DIAGNOSIS — C678 Malignant neoplasm of overlapping sites of bladder: Secondary | ICD-10-CM | POA: Diagnosis not present

## 2016-10-02 ENCOUNTER — Other Ambulatory Visit: Payer: Self-pay | Admitting: *Deleted

## 2016-10-02 MED ORDER — LORAZEPAM 0.5 MG PO TABS: ORAL_TABLET | ORAL | 0 refills | Status: DC

## 2016-10-02 NOTE — Telephone Encounter (Signed)
[place]

## 2016-11-07 DIAGNOSIS — B351 Tinea unguium: Secondary | ICD-10-CM | POA: Diagnosis not present

## 2016-11-07 DIAGNOSIS — B353 Tinea pedis: Secondary | ICD-10-CM | POA: Diagnosis not present

## 2016-11-07 DIAGNOSIS — L84 Corns and callosities: Secondary | ICD-10-CM | POA: Diagnosis not present

## 2016-11-28 ENCOUNTER — Non-Acute Institutional Stay: Payer: Medicare Other

## 2016-11-28 DIAGNOSIS — Z0000 Encounter for general adult medical examination without abnormal findings: Secondary | ICD-10-CM

## 2016-11-28 NOTE — Progress Notes (Signed)
Subjective:   Damon Miller is a 83 y.o. male who presents for an Initial Medicare Annual Wellness Visit at [facility]; incapacitated patient unable to answer questions appropriately     Objective:    Today's Vitals   11/28/16 1341  BP: 132/68  Pulse: 67  Temp: 97.8 F (36.6 C)  TempSrc: Oral  SpO2: 95%  Weight: 188 lb (85.3 kg)  Height: 5\' 9"  (1.753 m)   Body mass index is 27.76 kg/m.  Current Medications (verified) Outpatient Encounter Prescriptions as of 11/28/2016  Medication Sig  . acetaminophen (TYLENOL) 325 MG tablet Take 650 mg by mouth every 6 (six) hours as needed (for pain). For pain   . allopurinol (ZYLOPRIM) 100 MG tablet TAKE 1 TABLET ONCE DAILY TO LOWER URIC ACID.  Marland Kitchen atorvastatin (LIPITOR) 20 MG tablet TAKE 1 TABLET BY MOUTH EVERY DAY FOR CHOLESTEROL (Patient taking differently: TAKE 1 TABLET BY MOUTH EVERY DAY FOR CHOLESTEROL IN THE EVENING)  . cholecalciferol (VITAMIN D) 1000 UNITS tablet Take 2,000 Units by mouth daily.  . Emollient (EUCERIN ORIGINAL HEALING) LOTN Apply 1 application topically 2 (two) times daily.  . hydrocortisone cream 1 % Apply 1 application topically 4 (four) times daily as needed (applied to external hemorrhoids for pain/rectal bleeding.).  Marland Kitchen latanoprost (XALATAN) 0.005 % ophthalmic solution Place 1 drop into the right eye at bedtime.  Marland Kitchen levothyroxine (SYNTHROID, LEVOTHROID) 50 MCG tablet TAKE 1 TABLET BY MOUTH DAILY FOR THYROID SUPPLEMENT  . LORazepam (ATIVAN) 0.5 MG tablet Take 1/2 tablet by mouth prior to each shower three times a week with direct supervision during personal care (control)  . losartan (COZAAR) 25 MG tablet Take 1 tablet (25 mg total) by mouth daily.  . metoprolol succinate (TOPROL XL) 25 MG 24 hr tablet Take 1 tablet (25 mg total) by mouth daily.  Marland Kitchen NAMENDA XR 28 MG CP24 24 hr capsule Take 28 mg by mouth every evening.   . nitroGLYCERIN (NITROSTAT) 0.4 MG SL tablet Place 1 tablet (0.4 mg total) under  the tongue every 5 (five) minutes as needed for chest pain.  Marland Kitchen sertraline (ZOLOFT) 25 MG tablet Take 25 mg by mouth at bedtime.   . [DISCONTINUED] phenazopyridine (PYRIDIUM) 200 MG tablet Take 1 tablet (200 mg total) by mouth 3 (three) times daily as needed for pain.  . [DISCONTINUED] traMADol (ULTRAM) 50 MG tablet Take 1 tablet (50 mg total) by mouth every 6 (six) hours as needed for moderate pain.   No facility-administered encounter medications on file as of 11/28/2016.     Allergies (verified) Ibuprofen   History: Past Medical History:  Diagnosis Date  . Abnormality of gait   . Allergic rhinitis due to pollen   . Arthritis   . CAD (coronary artery disease)   . Cataracts, bilateral   . Chronic kidney disease, stage II (mild)   . Dementia   . Diverticulosis   . DNR (do not resuscitate)   . Edema    wears compression hose  . GERD (gastroesophageal reflux disease)   . Glaucoma    Right eye  . Gout, unspecified   . Hernia, hiatal   . Hyperglycemia 2014  . Hyperlipemia   . Hypertension   . Hypothyroid   . Prostate cancer ([place])   . Vitamin D deficiency disease    Past Surgical History:  Procedure Laterality Date  . COLONOSCOPY  07/1990, 10/1992, 12/1995 and 02/1999   Polyps removed at each procedure  . EYE SURGERY Bilateral 1999  Cataract removal  . SKIN CANCER EXCISION  2004   Ears  . TRANSURETHRAL RESECTION OF BLADDER TUMOR N/A 02/24/2016   Procedure: TRANSURETHRAL RESECTION OF BLADDER TUMOR (TURBT), BILATERAL RETROGRADE POLYGRAM;  Surgeon: Ardis Hughs, MD;  Location: [facility];  Service: Urology;  Laterality: N/A;   Family History  Problem Relation Age of Onset  . Heart attack Father    Social History   Occupational History  . retired from [employer] History Main Topics  . Smoking status: Former Smoker    Quit date: 05/16/1987  . Smokeless tobacco: Never Used  . Alcohol use 3.0 oz/week    5 Shots of liquor per week     Comment:  Drinks one [employer] couple nights a week  . Drug use: No  . Sexual activity: No   Tobacco Counseling Counseling given: Not Answered   Activities of Daily Living In your present state of health, do you have any difficulty performing the following activities: 11/28/2016 02/22/2016  Hearing? Tempie Donning  Vision? Y N  Difficulty concentrating or making decisions? Tempie Donning  Walking or climbing stairs? Y Y  Dressing or bathing? Y Y  Doing errands, shopping? Y N  Preparing Food and eating ? Y -  Using the Toilet? Y -  In the past six months, have you accidently leaked urine? Y -  Do you have problems with loss of bowel control? Y -  Managing your Medications? Y -  Managing your Finances? Y -  Housekeeping or managing your Housekeeping? Y -  Some recent data might be hidden    Immunizations and Health Maintenance Immunization History  Administered Date(s) Administered  . Influenza Inj Mdck Quad Pf 03/02/2016  . Influenza Whole 02/13/2012  . Influenza-Unspecified 02/18/2014, 02/25/2015  . Pneumococcal Conjugate-13 10/06/2014  . Pneumococcal Polysaccharide-23 05/15/1998  . Zoster 05/15/2006   Health Maintenance Due  Topic Date Due  . Samul Dada  04/17/1939    Patient Care Team: Gayland Curry, DO as PCP - General (Geriatric Medicine) [facility], Denice Bors, MD as Consulting Physician (Cardiology) Jacolyn Reedy, MD as Consulting Physician (Cardiology) Tyler Pita, MD as Consulting Physician (Radiation Oncology) Allyn Kenner, MD as Consulting Physician (Dermatology) Prentiss Bells, MD as Consulting Physician (Ophthalmology)  Indicate any recent Medical Services you may have received from other than [facility] providers in the past year (date may be approximate).    Assessment:   This is a routine wellness examination for Daryle.   Hearing/Vision screen No exam data present  Dietary issues and exercise activities discussed: Current Exercise Habits:  The patient does not participate in regular exercise at present, Exercise limited by: neurologic condition(s)  Goals    None     Depression Screen PHQ 2/9 Scores 11/28/2016 04/21/2015 04/21/2015 10/06/2014  PHQ - 2 Score 0 0 0 0    Fall Risk Fall Risk  11/28/2016 08/18/2015 04/21/2015 04/06/2014 04/06/2014  Falls in the past year? No No No Yes No  Number falls in past yr: - - - 1 -  Injury with Fall? - - - Yes -    Cognitive Function: MMSE - Mini Mental State Exam 03/02/2016 01/26/2015 06/17/2014 09/15/2013 11/18/2012  Orientation to time 0 0 0 2 0  Orientation to Place 3 4 4 5 5   Registration 3 3 3 3 3   Attention/ Calculation 5 2 5 5 5   Recall 0 0 2 2 2   Language- name 2 objects 2 2 2  2 2  Language- repeat 1 1 1 1 1   Language- follow 3 step command 3 3 3 3 3   Language- read & follow direction 1 1 1 1 1   Write a sentence 1 1 1 1 1   Copy design 1 1 1 1 1   Total score 20 18 23 26 24         Screening Tests Health Maintenance  Topic Date Due  . TETANUS/TDAP  04/17/1939  . INFLUENZA VACCINE  12/13/2016  . PNA vac Low Risk Adult  Completed        Plan:    I have personally reviewed and addressed the Medicare Annual Wellness questionnaire and have noted the following in the patient's chart:  A. Medical and social history B. Use of alcohol, tobacco or illicit drugs  C. Current medications and supplements D. Functional ability and status E.  Nutritional status F.  Physical activity G. Advance directives H. List of other physicians I.  Hospitalizations, surgeries, and ER visits in previous 12 months J.  [place] to include hearing, vision, cognitive, depression L. Referrals and appointments - none  In addition, unable to review and discuss with incapacitated patient certain preventive protocols, quality metrics, and best practice recommendations. A written personalized care plan for preventive services as well as general preventive health recommendations were provided  to patient.   See attached scanned questionnaire for additional information.   Signed,   Rich Reining, RN Nurse Health Advisor   Quick Notes   Health Maintenance: TDAP due     Abnormal Screen: MMSE on 03/02/16. Passed drawing clock     Patient Concerns: None     Nurse Concerns: None

## 2016-12-27 ENCOUNTER — Non-Acute Institutional Stay: Payer: Medicare Other | Admitting: Internal Medicine

## 2016-12-27 ENCOUNTER — Encounter: Payer: Self-pay | Admitting: Internal Medicine

## 2016-12-27 VITALS — BP 138/70 | HR 62 | Temp 97.9°F | Wt 198.0 lb

## 2016-12-27 DIAGNOSIS — F0281 Dementia in other diseases classified elsewhere with behavioral disturbance: Secondary | ICD-10-CM

## 2016-12-27 DIAGNOSIS — E7800 Pure hypercholesterolemia, unspecified: Secondary | ICD-10-CM | POA: Diagnosis not present

## 2016-12-27 DIAGNOSIS — I10 Essential (primary) hypertension: Secondary | ICD-10-CM

## 2016-12-27 DIAGNOSIS — F02818 Dementia in other diseases classified elsewhere, unspecified severity, with other behavioral disturbance: Secondary | ICD-10-CM | POA: Insufficient documentation

## 2016-12-27 DIAGNOSIS — E039 Hypothyroidism, unspecified: Secondary | ICD-10-CM

## 2016-12-27 DIAGNOSIS — C674 Malignant neoplasm of posterior wall of bladder: Secondary | ICD-10-CM | POA: Diagnosis not present

## 2016-12-27 DIAGNOSIS — G301 Alzheimer's disease with late onset: Secondary | ICD-10-CM | POA: Diagnosis not present

## 2016-12-27 DIAGNOSIS — N183 Chronic kidney disease, stage 3 unspecified: Secondary | ICD-10-CM

## 2016-12-27 NOTE — Progress Notes (Signed)
Location:  [facility] of Service:  Clinic (12)  Provider: Kymia Simi L. Mariea Clonts, D.O., C.M.D.  Code Status: DNR Goals of Care:  Advanced Directives 12/27/2016  Does Patient Have a Medical Advance Directive? Yes  Type of Advance Directive Out of facility DNR (pink MOST or yellow form);Healthcare Power of Attorney  Does patient want to make changes to medical advance directive? -  Copy of [place] in Chart? Yes  Pre-existing out of facility DNR order (yellow form or pink MOST form) -     Chief Complaint  Patient presents with  . Medical Management of Chronic Issues    follow-up    HPI: Patient is a 83 y.o. male seen today for medical management of chronic diseases.  Pt is here with his son today.    Bladder cancer:  Underwent TURBT with bilateral retrograde polygram 02/24/16 with Dr. Louis Meckel.  He's had a small recurrence that is being monitored.  No recent hematuria.  Going to urology every 4-6 mos.  Cystoscopies are not comfortable, but he does tolerate them.    Alzheimer's:  Short term is more off and on.  Long term is good.  No significant changes lately.  Gets ready on his own in the morning and does use his restroom on his own.  Walks with walker.    CKD3:  Needs f/u bmp.    HTN:  bp at goal.    CAD:  No chest pain, sob.    Wt 197 on 8/5.  Weight is up 10 lbs since July.  His son says he eats great when they eat together.    Past Medical History:  Diagnosis Date  . Abnormality of gait   . Allergic rhinitis due to pollen   . Arthritis   . CAD (coronary artery disease)   . Cataracts, bilateral   . Chronic kidney disease, stage II (mild)   . Dementia   . Diverticulosis   . DNR (do not resuscitate)   . Edema    wears compression hose  . GERD (gastroesophageal reflux disease)   . Glaucoma    Right eye  . Gout, unspecified   . Hernia, hiatal   . Hyperglycemia 2014  . Hyperlipemia   . Hypertension   . Hypothyroid   .  Prostate cancer ([place])   . Vitamin D deficiency disease     Past Surgical History:  Procedure Laterality Date  . COLONOSCOPY  07/1990, 10/1992, 12/1995 and 02/1999   Polyps removed at each procedure  . EYE SURGERY Bilateral 1999   Cataract removal  . SKIN CANCER EXCISION  2004   Ears  . TRANSURETHRAL RESECTION OF BLADDER TUMOR N/A 02/24/2016   Procedure: TRANSURETHRAL RESECTION OF BLADDER TUMOR (TURBT), BILATERAL RETROGRADE POLYGRAM;  Surgeon: Ardis Hughs, MD;  Location: [facility];  Service: Urology;  Laterality: N/A;    Allergies  Allergen Reactions  . Ibuprofen Rash    Allergies as of 12/27/2016      Reactions   Ibuprofen Rash      Medication List       Accurate as of 12/27/16 11:59 AM. Always use your most recent med list.          allopurinol 100 MG tablet Commonly known as:  ZYLOPRIM TAKE 1 TABLET ONCE DAILY TO LOWER URIC ACID.   atorvastatin 20 MG tablet Commonly known as:  LIPITOR Take 20 mg by mouth daily.   cholecalciferol 1000 units tablet Commonly known as:  VITAMIN D Take 2,000 Units by mouth daily.   EUCERIN ORIGINAL HEALING Lotn Apply 1 application topically 2 (two) times daily.   hydrocortisone cream 1 % Apply 1 application topically 4 (four) times daily as needed (applied to external hemorrhoids for pain/rectal bleeding.).   latanoprost 0.005 % ophthalmic solution Commonly known as:  XALATAN Place 1 drop into the right eye at bedtime.   levothyroxine 50 MCG tablet Commonly known as:  SYNTHROID, LEVOTHROID TAKE 1 TABLET BY MOUTH DAILY FOR THYROID SUPPLEMENT   losartan 25 MG tablet Commonly known as:  COZAAR Take 1 tablet (25 mg total) by mouth daily.   metoprolol succinate 25 MG 24 hr tablet Commonly known as:  TOPROL XL Take 1 tablet (25 mg total) by mouth daily.   NAMENDA XR 28 MG Cp24 24 hr capsule Generic drug:  memantine Take 28 mg by mouth every evening.   nitroGLYCERIN 0.4 MG SL tablet Commonly known as:  NITROSTAT Place 1  tablet (0.4 mg total) under the tongue every 5 (five) minutes as needed for chest pain.   sertraline 25 MG tablet Commonly known as:  ZOLOFT Take 25 mg by mouth at bedtime.       Review of Systems:  Review of Systems  Constitutional: Negative for chills, fever and malaise/fatigue.  HENT: Positive for hearing loss.   Eyes: Negative for blurred vision.       Glasses, new in past 1-2 years per his son  Respiratory: Negative for cough and shortness of breath.   Cardiovascular: Positive for leg swelling. Negative for chest pain and palpitations.       Compression hose effective  Gastrointestinal: Negative for abdominal pain, blood in stool, constipation and melena.  Genitourinary: Negative for dysuria and hematuria.  Musculoskeletal: Negative for falls and joint pain.  Skin: Negative for itching and rash.  Neurological: Negative for dizziness, loss of consciousness and weakness.  Endo/Heme/Allergies: Bruises/bleeds easily.  Psychiatric/Behavioral: Positive for memory loss. Negative for depression. The patient is not nervous/anxious and does not have insomnia.        At one time had agitation and behaviors with staff so ativan used, but since resolved and no concerns provided to me    Health Maintenance  Topic Date Due  . TETANUS/TDAP  04/17/1939  . INFLUENZA VACCINE  12/13/2016  . PNA vac Low Risk Adult  Completed    Physical Exam: Vitals:   12/27/16 1131  BP: 138/70  Pulse: 62  Temp: 97.9 F (36.6 C)  TempSrc: Oral  SpO2: 95%  Weight: 198 lb (89.8 kg)   Body mass index is 29.24 kg/m. Physical Exam  Constitutional: He appears well-developed and well-nourished. No distress.  HENT:  HOH  Eyes:  glasses  Cardiovascular: Normal rate, regular rhythm, normal heart sounds and intact distal pulses.   No murmur heard. Pulmonary/Chest: Effort normal and breath sounds normal. No respiratory distress.  Abdominal: Soft. Bowel sounds are normal. He exhibits no distension. There  is no tenderness.  Musculoskeletal: Normal range of motion.  Came in wheelchair today, often walks with walker in AL  Neurological: He is alert.  Pleasant and oriented to person and place  Skin: Skin is warm and dry. Capillary refill takes less than 2 seconds.  Psychiatric: He has a normal mood and affect.    Labs reviewed: Basic Metabolic Panel:  Recent Labs  02/22/16 1400  NA 137  K 4.3  CL 101  CO2 28  GLUCOSE 131*  BUN 27*  CREATININE 1.44*  CALCIUM  8.8*   Liver Function Tests: No results for input(s): AST, ALT, ALKPHOS, BILITOT, PROT, ALBUMIN in the last 8760 hours. No results for input(s): LIPASE, AMYLASE in the last 8760 hours. No results for input(s): AMMONIA in the last 8760 hours. CBC:  Recent Labs  02/14/16 0800  WBC 6.7  HGB 13.1*  HCT 42  PLT 229   Lipid Panel: No results for input(s): CHOL, HDL, LDLCALC, TRIG, CHOLHDL, LDLDIRECT in the last 8760 hours. Lab Results  Component Value Date   HGBA1C 6.1 02/14/2016   Reviewed urology notes since last year  Assessment/Plan 1. Malignant neoplasm of posterior wall of urinary bladder ([facility]) -s/p TURBT in October with Dr. Louis Meckel -doing well -had small area of recurrence, but asymptomatic and this is being watched -tolerating cystoscopies, but does admit they are quite uncomfortable for him  2. Late onset Alzheimer's disease with behavioral disturbance -moderate at best, doing well in AL as far as I've been told -independent except meds  3. CKD (chronic kidney disease) stage 3, GFR 30-59 ml/min -Avoid nephrotoxic agents like nsaids, dose adjust renally excreted meds, hydrate. -f/u bmp  4. Essential hypertension -bp at goal, takes toprol xl, losartan, no dizziness  5. Hypothyroidism, unspecified type -cont to monitor, f/u tsh, on levothyroxine 23mcg q am  6. Pure hypercholesterolemia -stable, cont lipitor 20mg  and f/u flp  Labs/tests ordered:  Cbc, bmp, flp next Tuesday so I can review  Next  appt:  6 mos med mgt  Kwan Shellhammer L. Redith Drach, D.O. [facility] [address], [place] [address] Cell Phone (Mon-Fri 8am-5pm):  [phone] On Call:  [phone] & follow prompts after 5pm & weekends Office Phone:  [phone] Office Fax:  [phone]

## 2017-01-02 DIAGNOSIS — N183 Chronic kidney disease, stage 3 (moderate): Secondary | ICD-10-CM | POA: Diagnosis not present

## 2017-01-02 DIAGNOSIS — C679 Malignant neoplasm of bladder, unspecified: Secondary | ICD-10-CM | POA: Diagnosis not present

## 2017-01-02 DIAGNOSIS — I2510 Atherosclerotic heart disease of native coronary artery without angina pectoris: Secondary | ICD-10-CM | POA: Diagnosis not present

## 2017-01-02 DIAGNOSIS — D649 Anemia, unspecified: Secondary | ICD-10-CM | POA: Diagnosis not present

## 2017-01-02 DIAGNOSIS — E785 Hyperlipidemia, unspecified: Secondary | ICD-10-CM | POA: Diagnosis not present

## 2017-01-02 LAB — BASIC METABOLIC PANEL
BUN: 23 — AB (ref 4–21)
Creatinine: 1.4 — AB (ref 0.6–1.3)
Glucose: 99
Potassium: 4.5 (ref 3.4–5.3)
Sodium: 139 (ref 137–147)

## 2017-01-02 LAB — CBC AND DIFFERENTIAL
HCT: 40 — AB (ref 41–53)
Hemoglobin: 13.3 — AB (ref 13.5–17.5)
Platelets: 208 (ref 150–399)
WBC: 6.7

## 2017-01-02 LAB — LIPID PANEL
Cholesterol: 164 (ref 0–200)
HDL: 53 (ref 35–70)
LDL Cholesterol: 80
Triglycerides: 158 (ref 40–160)

## 2017-02-27 DIAGNOSIS — M79671 Pain in right foot: Secondary | ICD-10-CM | POA: Diagnosis not present

## 2017-02-27 DIAGNOSIS — M79672 Pain in left foot: Secondary | ICD-10-CM | POA: Diagnosis not present

## 2017-02-27 DIAGNOSIS — L84 Corns and callosities: Secondary | ICD-10-CM | POA: Diagnosis not present

## 2017-02-27 DIAGNOSIS — B351 Tinea unguium: Secondary | ICD-10-CM | POA: Diagnosis not present

## 2017-03-04 DIAGNOSIS — Z79899 Other long term (current) drug therapy: Secondary | ICD-10-CM | POA: Diagnosis not present

## 2017-03-04 DIAGNOSIS — C674 Malignant neoplasm of posterior wall of bladder: Secondary | ICD-10-CM | POA: Diagnosis not present

## 2017-03-04 DIAGNOSIS — R319 Hematuria, unspecified: Secondary | ICD-10-CM | POA: Diagnosis not present

## 2017-03-04 DIAGNOSIS — N390 Urinary tract infection, site not specified: Secondary | ICD-10-CM | POA: Diagnosis not present

## 2017-03-04 DIAGNOSIS — R5381 Other malaise: Secondary | ICD-10-CM | POA: Diagnosis not present

## 2017-03-06 ENCOUNTER — Non-Acute Institutional Stay: Payer: Medicare Other | Admitting: Internal Medicine

## 2017-03-06 ENCOUNTER — Encounter: Payer: Self-pay | Admitting: Internal Medicine

## 2017-03-06 DIAGNOSIS — F0281 Dementia in other diseases classified elsewhere with behavioral disturbance: Secondary | ICD-10-CM

## 2017-03-06 DIAGNOSIS — N183 Chronic kidney disease, stage 3 unspecified: Secondary | ICD-10-CM

## 2017-03-06 DIAGNOSIS — F02818 Dementia in other diseases classified elsewhere, unspecified severity, with other behavioral disturbance: Secondary | ICD-10-CM

## 2017-03-06 DIAGNOSIS — C674 Malignant neoplasm of posterior wall of bladder: Secondary | ICD-10-CM

## 2017-03-06 DIAGNOSIS — G301 Alzheimer's disease with late onset: Secondary | ICD-10-CM | POA: Diagnosis not present

## 2017-03-06 NOTE — Progress Notes (Signed)
Patient ID: Damon Miller, male   DOB: Aug 17, 1919, 83 y.o.   MRN: [id number]  Location:  [facility] Room Number: 585 Place of Service:  ALF [phone]) Provider:  Gayland Curry, DO  Patient Care Team: Gayland Curry, DO as PCP - General (Geriatric Medicine) [facility] [place], Denice Bors, MD as Consulting Physician (Cardiology) Jacolyn Reedy, MD as Consulting Physician (Cardiology) Tyler Pita, MD as Consulting Physician (Radiation Oncology) Allyn Kenner, MD as Consulting Physician (Dermatology) Prentiss Bells, MD as Consulting Physician (Ophthalmology)  Extended Emergency Contact Information Primary Emergency Contact: Mcbroom,"Wat" [place] Phone: [phone] Relation: Son Secondary Emergency Contact: Edsel Petrin "Bab"          [place], [place] [employer] Phone: [phone] Relation: Daughter  Code Status:  DNR Goals of care: Advanced Directive information Advanced Directives 03/06/2017  Does Patient Have a Medical Advance Directive? Yes  Type of Advance Directive Out of facility DNR (pink MOST or yellow form);Healthcare Power of Attorney  Does patient want to make changes to medical advance directive? -  Copy of [place] in Chart? Yes  Pre-existing out of facility DNR order (yellow form or pink MOST form) Yellow form placed in chart (order not valid for inpatient use)   Chief Complaint  Patient presents with  . Acute Visit    increased tearfullness, declining function    HPI:  Pt is a 83 y.o. male seen today for an acute visit for increased tearfulness during adls/iadls in the mornings and declining function overall.  He has a h/o dementia, prostate cancer, fairly recent bladder cancer with tumor resection after gross hematuria, GERD, CKDII, glaucoma, CAD and lives in [place].  Staff have noted that recently he's been having tearfulness during his am  routine.  He's been crying when given razor and shaving cream to shave his face (he's done this before on his own).  He's requiring more help with his bathing, dressing, and grooming.  Of note, upon speaking with the nurse, his caregiver was away for 2 weeks so he was having different caregivers each morning that he was not familiar with.  When I saw him, he was his usual cheerful pleasant self.  He has short term memory loss so only able to answer current state questions accurately, but denied feeling sad or blue.  He also denied chest pain, sob, abdominal pain, dysuria, flank pain and nursing staff note no fever or abnormalities on vitals.    Past Medical History:  Diagnosis Date  . Abnormality of gait   . Allergic rhinitis due to pollen   . Arthritis   . CAD (coronary artery disease)   . Cataracts, bilateral   . Chronic kidney disease, stage II (mild)   . Dementia   . Diverticulosis   . DNR (do not resuscitate)   . Edema    wears compression hose  . GERD (gastroesophageal reflux disease)   . Glaucoma    Right eye  . Gout, unspecified   . Hernia, hiatal   . Hyperglycemia 2014  . Hyperlipemia   . Hypertension   . Hypothyroid   . Prostate cancer ([facility])   . Vitamin D deficiency disease    Past Surgical History:  Procedure Laterality Date  . COLONOSCOPY  07/1990, 10/1992, 12/1995 and 02/1999   Polyps removed at each procedure  . EYE SURGERY Bilateral 1999   Cataract removal  . SKIN CANCER EXCISION  2004  Ears  . TRANSURETHRAL RESECTION OF BLADDER TUMOR N/A 02/24/2016   Procedure: TRANSURETHRAL RESECTION OF BLADDER TUMOR (TURBT), BILATERAL RETROGRADE POLYGRAM;  Surgeon: Ardis Hughs, MD;  Location: [facility];  Service: Urology;  Laterality: N/A;    Allergies  Allergen Reactions  . Ibuprofen Rash    Outpatient Encounter Prescriptions as of 03/06/2017  Medication Sig  . allopurinol (ZYLOPRIM) 100 MG tablet TAKE 1 TABLET ONCE DAILY TO LOWER URIC ACID.  Marland Kitchen atorvastatin  (LIPITOR) 20 MG tablet Take 20 mg by mouth daily.  . cholecalciferol (VITAMIN D) 1000 UNITS tablet Take 2,000 Units by mouth daily.  . Emollient (EUCERIN ORIGINAL HEALING) LOTN Apply 1 application topically 2 (two) times daily.  . hydrocortisone cream 1 % Apply 1 application topically 4 (four) times daily as needed (applied to external hemorrhoids for pain/rectal bleeding.).  Marland Kitchen latanoprost (XALATAN) 0.005 % ophthalmic solution Place 1 drop into the right eye at bedtime.  Marland Kitchen levothyroxine (SYNTHROID, LEVOTHROID) 50 MCG tablet TAKE 1 TABLET BY MOUTH DAILY FOR THYROID SUPPLEMENT  . losartan (COZAAR) 25 MG tablet Take 1 tablet (25 mg total) by mouth daily.  . metoprolol succinate (TOPROL XL) 25 MG 24 hr tablet Take 1 tablet (25 mg total) by mouth daily.  Marland Kitchen NAMENDA XR 28 MG CP24 24 hr capsule Take 28 mg by mouth every evening.   . nitroGLYCERIN (NITROSTAT) 0.4 MG SL tablet Place 1 tablet (0.4 mg total) under the tongue every 5 (five) minutes as needed for chest pain.  Marland Kitchen sertraline (ZOLOFT) 25 MG tablet Take 25 mg by mouth at bedtime.    No facility-administered encounter medications on file as of 03/06/2017.     Review of Systems  Constitutional: Negative for activity change, appetite change, chills and fever.  HENT: Negative for congestion.   Eyes: Negative for visual disturbance.       Glasses  Respiratory: Negative for chest tightness and shortness of breath.   Gastrointestinal: Negative for abdominal pain, constipation, diarrhea, nausea and vomiting.  Genitourinary: Negative for dysuria and frequency.       Chronic incontinence  Musculoskeletal: Negative for arthralgias.  Skin: Positive for pallor.  Neurological: Negative for dizziness and weakness.  Hematological: Bruises/bleeds easily.  Psychiatric/Behavioral: Positive for confusion. Negative for agitation, self-injury and sleep disturbance. The patient is not nervous/anxious.        Tearfulness with adls/iadls in am    Immunization  History  Administered Date(s) Administered  . Influenza Inj Mdck Quad Pf 03/02/2016  . Influenza Whole 02/13/2012  . Influenza-Unspecified 02/18/2014, 02/25/2015  . Pneumococcal Conjugate-13 10/06/2014  . Pneumococcal Polysaccharide-23 05/15/1998  . Tdap 11/28/2016  . Zoster 05/15/2006   Pertinent  Health Maintenance Due  Topic Date Due  . INFLUENZA VACCINE  12/13/2016  . PNA vac Low Risk Adult  Completed   Fall Risk  12/27/2016 11/28/2016 08/18/2015 04/21/2015 04/06/2014  Falls in the past year? No No No No Yes  Number falls in past yr: - - - - 1  Injury with Fall? - - - - Yes  Comment - - - - skin tear right lower arm   Functional Status Survey:  requiring more assistance with grooming and bathing/dressing now  Vitals:   03/06/17 1303  BP: (!) 101/58  Pulse: 68  Resp: 20  Temp: 98.2 F (36.8 C)  TempSrc: Oral  SpO2: 93%  Weight: 198 lb (89.8 kg)   Body mass index is 29.24 kg/m. Physical Exam  Constitutional: He appears well-nourished. No distress.  HENT:  Head:  Normocephalic and atraumatic.  Mouth/Throat: No oropharyngeal exudate.  Cardiovascular: Normal rate, regular rhythm, normal heart sounds and intact distal pulses.   Pulmonary/Chest: Effort normal and breath sounds normal. No respiratory distress.  Abdominal: Soft. Bowel sounds are normal. He exhibits no distension. There is no tenderness. There is no rebound and no guarding.  Musculoskeletal: Normal range of motion.  Uses rolling walker to ambulate; some proximal LE weakness--difficulty with get up and go  Neurological: He is alert.  Oriented to person, recognizes me, but poor historian and repeats himself  Skin: Skin is warm and dry.  Psychiatric: He has a normal mood and affect.    Labs reviewed: No results for input(s): NA, K, CL, CO2, GLUCOSE, BUN, CREATININE, CALCIUM, MG, PHOS in the last 8760 hours. No results for input(s): AST, ALT, ALKPHOS, BILITOT, PROT, ALBUMIN in the last 8760 hours. No results  for input(s): WBC, NEUTROABS, HGB, HCT, MCV, PLT in the last 8760 hours. Lab Results  Component Value Date   TSH 1.39 11/15/2015   Lab Results  Component Value Date   HGBA1C 6.1 02/14/2016   Lab Results  Component Value Date   CHOL 143 11/15/2015   HDL 62 11/15/2015   LDLCALC 67 11/15/2015   TRIG 67 11/15/2015   CHOLHDL 4.1 06/01/2012   Assessment/Plan 1. Late onset Alzheimer's disease with behavioral disturbance -appears this is progressing some as far as his decrease in ADLs, but seems spirits were affected by his caregiver being gone for 2 wks also -will monitor to see if he's able to function as before with her being back or if he still gets frustrated and tearful with adls -if decline continues, may need a move to memory care  2. CKD (chronic kidney disease) stage 3, GFR 30-59 ml/min ([facility]) -opted not to do additional labs at this time given explanation for behavior above--if other signs of decline persist, would add cbc, bmp--just had cbc, bmp and lipid panel ordered last appt  3. Malignant neoplasm of posterior wall of urinary bladder ([facility]) -s/p resection of tumor, no signs of problems related to this--no hematuria or change in urinary patterns, has incontinence--uses depends  Family/ staff Communication: discussed with AL nursing  Labs/tests ordered:  No new at this time  Michale Emmerich L. Zellie Jenning, D.O. [facility] [address], [place] [address] Cell Phone (Mon-Fri 8am-5pm):  [phone] On Call:  [phone] & follow prompts after 5pm & weekends Office Phone:  [phone] Office Fax:  [phone]

## 2017-03-08 DIAGNOSIS — M545 Low back pain: Secondary | ICD-10-CM | POA: Diagnosis not present

## 2017-03-08 DIAGNOSIS — M25559 Pain in unspecified hip: Secondary | ICD-10-CM | POA: Diagnosis not present

## 2017-03-20 ENCOUNTER — Other Ambulatory Visit: Payer: Self-pay | Admitting: Internal Medicine

## 2017-03-27 DIAGNOSIS — C678 Malignant neoplasm of overlapping sites of bladder: Secondary | ICD-10-CM | POA: Diagnosis not present

## 2017-04-10 ENCOUNTER — Non-Acute Institutional Stay: Payer: Medicare Other | Admitting: Internal Medicine

## 2017-04-10 ENCOUNTER — Encounter: Payer: Self-pay | Admitting: Internal Medicine

## 2017-04-10 DIAGNOSIS — I10 Essential (primary) hypertension: Secondary | ICD-10-CM

## 2017-04-10 DIAGNOSIS — G301 Alzheimer's disease with late onset: Secondary | ICD-10-CM

## 2017-04-10 DIAGNOSIS — N183 Chronic kidney disease, stage 3 unspecified: Secondary | ICD-10-CM

## 2017-04-10 DIAGNOSIS — F0280 Dementia in other diseases classified elsewhere without behavioral disturbance: Secondary | ICD-10-CM | POA: Diagnosis not present

## 2017-04-10 DIAGNOSIS — F329 Major depressive disorder, single episode, unspecified: Secondary | ICD-10-CM | POA: Diagnosis not present

## 2017-04-10 DIAGNOSIS — C674 Malignant neoplasm of posterior wall of bladder: Secondary | ICD-10-CM | POA: Diagnosis not present

## 2017-04-10 DIAGNOSIS — E039 Hypothyroidism, unspecified: Secondary | ICD-10-CM | POA: Diagnosis not present

## 2017-04-10 DIAGNOSIS — I2510 Atherosclerotic heart disease of native coronary artery without angina pectoris: Secondary | ICD-10-CM | POA: Diagnosis not present

## 2017-04-10 DIAGNOSIS — I255 Ischemic cardiomyopathy: Secondary | ICD-10-CM | POA: Diagnosis not present

## 2017-04-10 DIAGNOSIS — E7800 Pure hypercholesterolemia, unspecified: Secondary | ICD-10-CM

## 2017-04-10 DIAGNOSIS — H401190 Primary open-angle glaucoma, unspecified eye, stage unspecified: Secondary | ICD-10-CM

## 2017-04-10 DIAGNOSIS — F32A Depression, unspecified: Secondary | ICD-10-CM

## 2017-04-10 DIAGNOSIS — F0281 Dementia in other diseases classified elsewhere with behavioral disturbance: Secondary | ICD-10-CM | POA: Diagnosis not present

## 2017-04-10 DIAGNOSIS — F02818 Dementia in other diseases classified elsewhere, unspecified severity, with other behavioral disturbance: Secondary | ICD-10-CM

## 2017-04-10 DIAGNOSIS — F0393 Unspecified dementia, unspecified severity, with mood disturbance: Secondary | ICD-10-CM

## 2017-04-10 NOTE — Progress Notes (Addendum)
Patient ID: Damon Miller, male   DOB: 02/25/1920, 83 y.o.   MRN: [id number]  Provider:  Rexene Edison. Mariea Clonts, D.O., C.M.D. Location:  [facility] Room Number: 284 memory care Place of Service:  ALF (13)  PCP: Gayland Curry, DO Patient Care Team: Gayland Curry, DO as PCP - General (Geriatric Medicine) Community, [facility], Denice Bors, MD as Consulting Physician (Cardiology) Jacolyn Reedy, MD as Consulting Physician (Cardiology) Tyler Pita, MD as Consulting Physician (Radiation Oncology) Allyn Kenner, MD as Consulting Physician (Dermatology) Prentiss Bells, MD as Consulting Physician (Ophthalmology)  Extended Emergency Contact Information Primary Emergency Contact: Straw,"Wat" Damon Miller States of [place] Phone: [phone] Relation: Son Secondary Emergency Contact: Damon Miller "Bab"          [place], [place] of [place] Mobile Phone: [phone] Relation: Daughter  Code Status: DNR, living will and hcpoa, needs MOST completed  Goals of Care: Advanced Directive information Advanced Directives 04/10/2017  Does Patient Have a Medical Advance Directive? Yes  Type of Advance Directive Out of facility DNR (pink MOST or yellow form);Healthcare Power of Attorney  Does patient want to make changes to medical advance directive? No - Patient declined  Copy of [place] in Chart? Yes  Pre-existing out of facility DNR order (yellow form or pink MOST form) Yellow form placed in chart (order not valid for inpatient use)   Chief Complaint  Patient presents with  . New Admit To SNF    new admit to memory care    HPI: Patient is a 83 y.o. male seen today for admission to [facility] memory care [facility]) "AL bed" from assisted living due to progressing dementia.  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  For this, he underwent TURBT 02/25/16.  He did well with the procedure.  In Feb of 2018, he underwent surveillance cystoscopy and a 74mm recurrence was noted on the posterior bladder wall.  Surveillance was chosen.  On 09/24/16, he had another cystoscopy which showed a now 39mm posterior bladder wall lesion (enlarged) and a flat line 89mm lesion near the ureteral orifice.  Fulguration was done on that new lesion.  He was noted to have microhematuria, as well.  At his Nov 13th appt with Dr. Louis Meckel, another TURBT was recommended due to concerns that he will develop outright hematuria and urinary symptoms if this is delayed.  His family was thinking about it due to his progressing dementia.   Dementia and depression:  He has been having more difficulty with self-care as of late and periods of tearfulness when shaving and brushing his teeth.  At first, this was thought to be due to a change in caregivers while his was away, but he also had just had his zoloft stopped per pharmacy recommendations.  Though his usual caregiver returned, his tearfulness persisted and difficulty with self-care adls.  His zoloft was resumed in hopes it would help his spirits.  The remainder of the day, pt remains cheerful and pleasant, but has severe short-term memory loss.  He says he doesn't no to most questions.  He remains active in programs in facility and can do some of his adls with cues.  He is on namenda XR.   Hypothyroidism:  Continues of levothyroxine 53mcg daily.   Lab Results  Component Value Date  TSH 1.39 11/15/2015   CAD with ischemic cardiomyopathy:  Continues on toprol xl, losartan, lipito.  Asymptomatic.    CKD3:  Avoiding nephrotoxic meds.  On arb which should be held in times of poor po intake.  Glaucoma:  Continues on latanoprost drops.  Wears glasses.  Seems to see well.    Hyperlipidemia:  Tolerates meds well and eats well.  Has  CAD.  Remains on statin w/o tolerability issues.    Past Medical History:  Diagnosis Date  . Abnormality of gait   . Allergic rhinitis due to pollen   . Arthritis   . CAD (coronary artery disease)   . Cataracts, bilateral   . Chronic kidney disease, stage II (mild)   . Dementia   . Diverticulosis   . DNR (do not resuscitate)   . Edema    wears compression hose  . GERD (gastroesophageal reflux disease)   . Glaucoma    Right eye  . Gout, unspecified   . Hernia, hiatal   . Hyperglycemia 2014  . Hyperlipemia   . Hypertension   . Hypothyroid   . Prostate cancer ([place])   . Vitamin D deficiency disease    Past Surgical History:  Procedure Laterality Date  . COLONOSCOPY  07/1990, 10/1992, 12/1995 and 02/1999   Polyps removed at each procedure  . EYE SURGERY Bilateral 1999   Cataract removal  . SKIN CANCER EXCISION  2004   Ears  . TRANSURETHRAL RESECTION OF BLADDER TUMOR N/A 02/24/2016   Procedure: TRANSURETHRAL RESECTION OF BLADDER TUMOR (TURBT), BILATERAL RETROGRADE POLYGRAM;  Surgeon: Ardis Hughs, MD;  Location: [facility];  Service: Urology;  Laterality: N/A;    reports that he quit smoking about 29 years ago. he has never used smokeless tobacco. He reports that he drinks about 3.0 oz of alcohol per week. He reports that he does not use drugs. Social History   Socioeconomic History  . Marital status: Widowed    Spouse name: Not on file  . Number of children: Not on file  . Years of education: Not on file  . Highest education level: Not on file  Social Needs  . Financial resource strain: Not on file  . Food insecurity - worry: Not on file  . Food insecurity - inability: Not on file  . Transportation needs - medical: Not on file  . Transportation needs - non-medical: Not on file  Occupational History  . Occupation: retired from [employer]  . Smoking status: Former Smoker    Last attempt to quit: 05/16/1987    Years since quitting: 29.9  .  Smokeless tobacco: Never Used  Substance and Sexual Activity  . Alcohol use: Yes    Alcohol/week: 3.0 oz    Types: 5 Shots of liquor per week    Comment: Drinks one [employer] couple nights a week  . Drug use: No  . Sexual activity: No  Other Topics Concern  . Not on file  Social History Narrative   Lives at [facility]   Widowed   DNR/POA/Living Will   Exercise: walking , plays bingo   Walks with walker   Stopped smoking 1989   Alcohol on [place] couple nights a week              Functional Status Survey:    Family History  Problem Relation Age of Onset  . Heart attack Father     Health Maintenance  Topic Date Due  . INFLUENZA  VACCINE  12/13/2016  . TETANUS/TDAP  11/29/2026  . PNA vac Low Risk Adult  Completed    Allergies  Allergen Reactions  . Ibuprofen Rash    Outpatient Encounter Medications as of 04/10/2017  Medication Sig  . acetaminophen (TYLENOL) 325 MG tablet Take 650 mg by mouth 2 (two) times daily as needed.  Marland Kitchen allopurinol (ZYLOPRIM) 100 MG tablet TAKE 1 TABLET ONCE DAILY TO LOWER URIC ACID.  Marland Kitchen atorvastatin (LIPITOR) 20 MG tablet Take 20 mg by mouth daily.  . cholecalciferol (VITAMIN D) 1000 UNITS tablet Take 2,000 Units by mouth daily.  . Emollient (EUCERIN ORIGINAL HEALING) LOTN Apply 1 application topically 2 (two) times daily.  . hydrocortisone cream 1 % Apply 1 application topically 4 (four) times daily as needed (applied to external hemorrhoids for pain/rectal bleeding.).  Marland Kitchen latanoprost (XALATAN) 0.005 % ophthalmic solution Place 1 drop into the right eye at bedtime.  Marland Kitchen levothyroxine (SYNTHROID, LEVOTHROID) 50 MCG tablet TAKE 1 TABLET BY MOUTH DAILY FOR THYROID SUPPLEMENT  . losartan (COZAAR) 25 MG tablet Take 1 tablet (25 mg total) by mouth daily.  . metoprolol succinate (TOPROL XL) 25 MG 24 hr tablet Take 1 tablet (25 mg total) by mouth daily.  Marland Kitchen NAMENDA XR 28 MG CP24 24 hr capsule Take 28 mg by mouth every evening.   . nitroGLYCERIN  (NITROSTAT) 0.4 MG SL tablet Place 1 tablet (0.4 mg total) under the tongue every 5 (five) minutes as needed for chest pain.  Marland Kitchen sertraline (ZOLOFT) 25 MG tablet Take 25 mg by mouth at bedtime.    No facility-administered encounter medications on file as of 04/10/2017.     Review of Systems  Constitutional: Negative for activity change, appetite change, chills, fatigue and fever.  HENT: Positive for hearing loss. Negative for congestion and trouble swallowing.   Eyes: Negative for visual disturbance.       Glasses, glaucoma  Respiratory: Negative for chest tightness and shortness of breath.   Cardiovascular: Negative for chest pain, palpitations and leg swelling.  Gastrointestinal: Negative for abdominal pain, blood in stool, constipation, diarrhea, nausea and vomiting.  Genitourinary: Negative for dysuria and hematuria.  Musculoskeletal: Positive for gait problem.       Uses walker  Skin: Negative for color change.  Neurological: Negative for dizziness and weakness.  Hematological: Does not bruise/bleed easily.  Psychiatric/Behavioral: Positive for confusion. Negative for agitation, behavioral problems and sleep disturbance.       Tearfulness    Vitals:   04/10/17 1523  BP: 132/70  Pulse: 66  Resp: 18  Temp: 98.1 F (36.7 C)  TempSrc: Oral  SpO2: 92%  Weight: 198 lb (89.8 kg)   Body mass index is 29.24 kg/m. Physical Exam  Constitutional: He appears well-developed. No distress.  HENT:  Head: Normocephalic and atraumatic.  Right Ear: External ear normal.  Left Ear: External ear normal.  Nose: Nose normal.  Mouth/Throat: Oropharynx is clear and moist.  Eyes: Conjunctivae and EOM are normal. Pupils are equal, round, and reactive to light.  glasses  Neck: Neck supple. No JVD present.  Cardiovascular: Normal rate, regular rhythm, normal heart sounds and intact distal pulses.  No murmur heard. Pulmonary/Chest: Effort normal and breath sounds normal. No respiratory  distress.  Abdominal: Soft. Bowel sounds are normal. He exhibits no distension. There is no tenderness.  Musculoskeletal: Normal range of motion. He exhibits no edema, tenderness or deformity.  Walks with rolling walker with skis  Lymphadenopathy:    He has no cervical adenopathy.  Neurological: He is alert.  Oriented to person, needs help finding way around [facility] and with adls  Skin: Skin is warm and dry.  Psychiatric: He has a normal mood and affect.  Very pleasant man    Labs reviewed: Basic Metabolic Panel: Recent Labs    01/02/17 0700  NA 139  K 4.5  BUN 23*  CREATININE 1.4*   Liver Function Tests: No results for input(s): AST, ALT, ALKPHOS, BILITOT, PROT, ALBUMIN in the last 8760 hours. No results for input(s): LIPASE, AMYLASE in the last 8760 hours. No results for input(s): AMMONIA in the last 8760 hours. CBC: Recent Labs    01/02/17 0700  WBC 6.7  HGB 13.3*  HCT 40*  PLT 208   Cardiac Enzymes: No results for input(s): CKTOTAL, CKMB, CKMBINDEX, TROPONINI in the last 8760 hours. BNP: Invalid input(s): POCBNP Lab Results  Component Value Date   HGBA1C 6.1 02/14/2016   Lab Results  Component Value Date   TSH 1.39 11/15/2015   Reviewed latest notes from [facility], Dr. Louis Meckel  Assessment/Plan 1. 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 Louis Meckel due to recurrent tumor on posterior bladder wall pending family decision about surgery at this point in his dementia                                                    2. Late onset Alzheimer's disease with behavioral disturbance -cont namenda XR due to continued activity participation and some preserved function, no tolerability issues with meds MMSE - Mini Mental State Exam 03/02/2016 01/26/2015 06/17/2014  Orientation to time 0 0 0  Orientation to Place 3 4 4   Registration 3 3 3   Attention/ Calculation 5 2 5   Recall 0 0 2  Language- name 2  objects 2 2 2   Language- repeat 1 1 1   Language- follow 3 step command 3 3 3   Language- read & follow direction 1 1 1   Write a sentence 1 1 1   Copy design 1 1 1   Total score 20 18 23   -has been progressing with increased ADL needs, cues and more depression recently -very pleasant  3. CKD (chronic kidney disease) stage 3, GFR 30-59 ml/min (HCC) -Avoid nephrotoxic agents like nsaids, dose adjust renally excreted meds, hydrate. -hold ARB if he has poor po intake -h/o gout likely related, but no recent flares  4. Hypothyroidism, unspecified type -cont current levothyroxine, check TSH if last really was in 2017  5. Coronary artery disease involving native coronary artery of native heart without angina pectoris -asymptomatic, cont beta blocker, statin, arb  6. Cardiomyopathy, ischemic -last echo was 06/02/12 with EF 40-45% when he had NSTEMI -mild systolic chf, hypokinetic posterolateral and apical myocardium and grade 1 diastolic dysfunction, mild aortic regurg -asymptomatic  7. Essential hypertension -bp at goal with current therapy of arb and beta blocker  8. Primary open angle glaucoma, unspecified glaucoma stage, unspecified laterality -continues on latanoprost drops, previously followed w/ Dr. Lynnell Grain seeing Dr. Ellie Lunch here  32. Depression due to dementia -back on zoloft after he had significantly increased tearfulness during am routine--failed stopping med per pharmacy recs  10. Pure hypercholesterolemia -cont statin therapy as no tolerability issues and CAD history of nstemi  Family/ staff Communication: discussed with memory care nurse  Labs/tests ordered:  TSH   Maryclare Nydam L. Tiffeny Minchew, D.O. [facility] [address], [place] [address] Cell Phone (Mon-Fri 8am-5pm):  [phone] On Call:  [phone] & follow prompts after 5pm & weekends Office Phone:  [phone] Office Fax:  [phone]

## 2017-04-23 DIAGNOSIS — F32A Depression, unspecified: Secondary | ICD-10-CM | POA: Insufficient documentation

## 2017-04-23 DIAGNOSIS — H409 Unspecified glaucoma: Secondary | ICD-10-CM | POA: Insufficient documentation

## 2017-04-23 DIAGNOSIS — F0393 Unspecified dementia, unspecified severity, with mood disturbance: Secondary | ICD-10-CM | POA: Insufficient documentation

## 2017-04-23 DIAGNOSIS — F329 Major depressive disorder, single episode, unspecified: Secondary | ICD-10-CM

## 2017-04-23 DIAGNOSIS — F0280 Dementia in other diseases classified elsewhere without behavioral disturbance: Secondary | ICD-10-CM | POA: Insufficient documentation

## 2017-04-23 NOTE — Addendum Note (Signed)
Addended by: Hollace Kinnier L on: 04/23/2017 09:10 AM   Modules accepted: Level of Service

## 2017-04-25 DIAGNOSIS — E039 Hypothyroidism, unspecified: Secondary | ICD-10-CM | POA: Diagnosis not present

## 2017-04-25 LAB — TSH: TSH: 1.21 (ref <=5.90)

## 2017-06-07 DIAGNOSIS — M79672 Pain in left foot: Secondary | ICD-10-CM | POA: Diagnosis not present

## 2017-06-07 DIAGNOSIS — M79671 Pain in right foot: Secondary | ICD-10-CM | POA: Diagnosis not present

## 2017-06-07 DIAGNOSIS — L84 Corns and callosities: Secondary | ICD-10-CM | POA: Diagnosis not present

## 2017-06-07 DIAGNOSIS — B351 Tinea unguium: Secondary | ICD-10-CM | POA: Diagnosis not present

## 2017-06-27 ENCOUNTER — Encounter: Payer: Self-pay | Admitting: Internal Medicine

## 2017-07-12 ENCOUNTER — Non-Acute Institutional Stay: Payer: Medicare Other | Admitting: Adult Health

## 2017-07-12 DIAGNOSIS — N183 Chronic kidney disease, stage 3 unspecified: Secondary | ICD-10-CM

## 2017-07-12 DIAGNOSIS — C674 Malignant neoplasm of posterior wall of bladder: Secondary | ICD-10-CM | POA: Diagnosis not present

## 2017-07-12 DIAGNOSIS — F0393 Unspecified dementia, unspecified severity, with mood disturbance: Secondary | ICD-10-CM

## 2017-07-12 DIAGNOSIS — I10 Essential (primary) hypertension: Secondary | ICD-10-CM | POA: Diagnosis not present

## 2017-07-12 DIAGNOSIS — F0280 Dementia in other diseases classified elsewhere without behavioral disturbance: Secondary | ICD-10-CM

## 2017-07-12 DIAGNOSIS — F02818 Dementia in other diseases classified elsewhere, unspecified severity, with other behavioral disturbance: Secondary | ICD-10-CM

## 2017-07-12 DIAGNOSIS — F329 Major depressive disorder, single episode, unspecified: Secondary | ICD-10-CM

## 2017-07-12 DIAGNOSIS — G301 Alzheimer's disease with late onset: Secondary | ICD-10-CM

## 2017-07-12 DIAGNOSIS — F0281 Dementia in other diseases classified elsewhere with behavioral disturbance: Secondary | ICD-10-CM | POA: Diagnosis not present

## 2017-07-15 ENCOUNTER — Encounter: Payer: Self-pay | Admitting: Adult Health

## 2017-07-15 NOTE — Assessment & Plan Note (Signed)
Continue to periodically monitor BMP and avoid nephrotoxic agents

## 2017-07-15 NOTE — Assessment & Plan Note (Signed)
Controlled. Continue Cozaar 25 mg qd and Toprol 25 mg xl qd.

## 2017-07-15 NOTE — Assessment & Plan Note (Signed)
The behavior that he is having with personal care is not easily fixed with medication. He is still ambulatory and at risk for falls so I would avoid adding more medication or escalating doses unless the behavior worsens.

## 2017-07-15 NOTE — Assessment & Plan Note (Signed)
Controlled.Continue zoloft 25 mg qd.

## 2017-07-15 NOTE — Assessment & Plan Note (Signed)
Continue to monitor for disease progression. No aggressive measures due to age/dementia.

## 2017-07-15 NOTE — Progress Notes (Signed)
Location:  Occupational psychologist of Service:  ALF (13) Provider:   Cindi Carbon, ANP [place] [phone]   Gayland Curry, DO  Patient Care Team: Gayland Curry, DO as PCP - General (Geriatric Medicine) Community[facility], Denice Bors, MD as Consulting Physician (Cardiology) Jacolyn Reedy, MD as Consulting Physician (Cardiology) Tyler Pita, MD as Consulting Physician (Radiation Oncology) Allyn Kenner, MD as Consulting Physician (Dermatology) Ardis Hughs, MD as Attending Physician (Urology) Royal Hawthorn, NP as Nurse Practitioner (Nurse Practitioner)  Extended Emergency Contact Information Primary Emergency Contact: Grismore,"Wat" Tyrone [place] States of [place] Phone: [phone] Relation: Son Secondary Emergency Contact: Edsel Petrin "Bab"          [place], [place] of [place] Phone: [phone] Relation: Daughter  Code Status:  DNR Goals of care: Advanced Directive information Advanced Directives 04/10/2017  Does Patient Have a Medical Advance Directive? Yes  Type of Advance Directive Out of facility DNR (pink MOST or yellow form);Healthcare Power of Attorney  Does patient want to make changes to medical advance directive? No - Patient declined  Copy of [place] in Chart? Yes  Pre-existing out of facility DNR order (yellow form or pink MOST form) Yellow form placed in chart (order not valid for inpatient use)     Chief Complaint  Patient presents with  . Medical Management of Chronic Issues    HPI:  Pt is a 84 y.o. male seen today for medical management of chronic diseases.  He moved to memory care in November due to progressive dementia.  The staff report that he continues to have agitation during personal care and yells and screams during bath time. He is given ativan prior to baths but it doesn't seem to help. Most of the time he is pleasant  and has no behavioral issues, only during baths.   He has a hx of neoplasm to the bladder wall. He has a TURBT in 2017.  He has surveillance with cystoscopy performed by urology. In November of 2018 another procedure was recommended due to a growing neoplasm and concern for the development of hematuria. His family was considering this option but I believe they opted against it but I don't have any recent urology note since Nov to verify. The staff deny that Mr. Balinda Quails has any bladder pain or hematuria at this time. His dementia is progressing and he could not provide a hx for me. He had no complaints and was very pleasant.  CKD stage III Lab Results  Component Value Date   BUN 23 (A) 01/02/2017   Lab Results  Component Value Date   CREATININE 1.4 (A) 01/02/2017   HTN: controlled 138/72  Past Medical History:  Diagnosis Date  . Abnormality of gait   . Allergic rhinitis due to pollen   . Arthritis   . CAD (coronary artery disease)   . Cataracts, bilateral   . Chronic kidney disease, stage II (mild)   . Dementia   . Diverticulosis   . DNR (do not resuscitate)   . Edema    wears compression hose  . GERD (gastroesophageal reflux disease)   . Glaucoma    Right eye  . Gout, unspecified   . Hernia, hiatal   . Hyperglycemia 2014  . Hyperlipemia   . Hypertension   . Hypothyroid   . Prostate cancer ([place])   . Vitamin D deficiency disease    Past Surgical History:  Procedure  Laterality Date  . COLONOSCOPY  07/1990, 10/1992, 12/1995 and 02/1999   Polyps removed at each procedure  . EYE SURGERY Bilateral 1999   Cataract removal  . SKIN CANCER EXCISION  2004   Ears  . TRANSURETHRAL RESECTION OF BLADDER TUMOR N/A 02/24/2016   Procedure: TRANSURETHRAL RESECTION OF BLADDER TUMOR (TURBT), BILATERAL RETROGRADE POLYGRAM;  Surgeon: Ardis Hughs, MD;  Location: [facility];  Service: Urology;  Laterality: N/A;    Allergies  Allergen Reactions  . Ibuprofen Rash    Outpatient Encounter  Medications as of 07/12/2017  Medication Sig  . acetaminophen (TYLENOL) 325 MG tablet Take 650 mg by mouth 2 (two) times daily as needed.  Marland Kitchen allopurinol (ZYLOPRIM) 100 MG tablet TAKE 1 TABLET ONCE DAILY TO LOWER URIC ACID.  Marland Kitchen atorvastatin (LIPITOR) 20 MG tablet Take 20 mg by mouth daily.  . cholecalciferol (VITAMIN D) 1000 UNITS tablet Take 2,000 Units by mouth daily.  . Emollient (EUCERIN ORIGINAL HEALING) LOTN Apply 1 application topically 2 (two) times daily.  . hydrocortisone cream 1 % Apply 1 application topically 4 (four) times daily as needed (applied to external hemorrhoids for pain/rectal bleeding.).  Marland Kitchen latanoprost (XALATAN) 0.005 % ophthalmic solution Place 1 drop into the right eye at bedtime.  Marland Kitchen levothyroxine (SYNTHROID, LEVOTHROID) 50 MCG tablet TAKE 1 TABLET BY MOUTH DAILY FOR THYROID SUPPLEMENT  . LORazepam (ATIVAN) 0.5 MG tablet Take 0.25 mg by mouth 2 (two) times a week. With showers  . losartan (COZAAR) 25 MG tablet Take 1 tablet (25 mg total) by mouth daily.  . metoprolol succinate (TOPROL XL) 25 MG 24 hr tablet Take 1 tablet (25 mg total) by mouth daily.  Marland Kitchen NAMENDA XR 28 MG CP24 24 hr capsule Take 28 mg by mouth every evening.   . nitroGLYCERIN (NITROSTAT) 0.4 MG SL tablet Place 1 tablet (0.4 mg total) under the tongue every 5 (five) minutes as needed for chest pain.  Marland Kitchen sertraline (ZOLOFT) 25 MG tablet Take 25 mg by mouth at bedtime.    No facility-administered encounter medications on file as of 07/12/2017.     Review of Systems  Constitutional: Negative for activity change, appetite change, chills, diaphoresis, fatigue, fever and unexpected weight change.  Respiratory: Negative for cough, shortness of breath, wheezing and stridor.   Cardiovascular: Negative for chest pain, palpitations and leg swelling.  Gastrointestinal: Negative for abdominal distention, abdominal pain, constipation and diarrhea.  Genitourinary: Negative for difficulty urinating, dysuria, flank pain,  frequency and hematuria.  Musculoskeletal: Positive for gait problem. Negative for arthralgias, back pain, joint swelling and myalgias.  Neurological: Negative for dizziness, seizures, syncope, facial asymmetry, speech difficulty, weakness and headaches.  Hematological: Negative for adenopathy. Does not bruise/bleed easily.  Psychiatric/Behavioral: Positive for agitation, behavioral problems and confusion.    Immunization History  Administered Date(s) Administered  . Influenza Inj Mdck Quad Pf 03/02/2016  . Influenza Whole 02/13/2012  . Influenza-Unspecified 02/18/2014, 02/25/2015  . Pneumococcal Conjugate-13 10/06/2014  . Pneumococcal Polysaccharide-23 05/15/1998  . Tdap 11/28/2016  . Zoster 05/15/2006  . Zoster Recombinat (Shingrix) 04/10/2017   Pertinent  Health Maintenance Due  Topic Date Due  . INFLUENZA VACCINE  Completed  . PNA vac Low Risk Adult  Completed   Fall Risk  12/27/2016 11/28/2016 08/18/2015 04/21/2015 04/06/2014  Falls in the past year? No No No No Yes  Number falls in past yr: - - - - 1  Injury with Fall? - - - - Yes  Comment - - - - skin tear right  lower arm   Functional Status Survey:    Vitals:   07/12/17 1500  BP: 138/72  Pulse: 63  Resp: 20  Temp: 98.3 F (36.8 C)  SpO2: 98%  Weight: 195 lb 6.4 oz (88.6 kg)   Body mass index is 28.86 kg/m.  Wt Readings from Last 3 Encounters:  07/12/17 195 lb 6.4 oz (88.6 kg)  04/10/17 198 lb (89.8 kg)  03/06/17 198 lb (89.8 kg)   Physical Exam  Constitutional: No distress.  HENT:  Head: Normocephalic and atraumatic.  Nose: Nose normal.  Mouth/Throat: Oropharynx is clear and moist. No oropharyngeal exudate.  Neck: No JVD present.  Cardiovascular: Normal rate and regular rhythm.  No murmur heard. Pulmonary/Chest: Effort normal and breath sounds normal. No respiratory distress. He has no wheezes.  Abdominal: Soft. Bowel sounds are normal. He exhibits no distension. There is no tenderness.  Lymphadenopathy:     He has no cervical adenopathy.  Neurological: He is alert.  Oriented to self only  Skin: Skin is warm and dry. He is not diaphoretic.  Psychiatric: He has a normal mood and affect.    Labs reviewed: Recent Labs    01/02/17 0700  NA 139  K 4.5  BUN 23*  CREATININE 1.4*   No results for input(s): AST, ALT, ALKPHOS, BILITOT, PROT, ALBUMIN in the last 8760 hours. Recent Labs    01/02/17 0700  WBC 6.7  HGB 13.3*  HCT 40*  PLT 208   Lab Results  Component Value Date   TSH 1.21 04/25/2017   Lab Results  Component Value Date   HGBA1C 6.1 02/14/2016   Lab Results  Component Value Date   CHOL 164 01/02/2017   HDL 53 01/02/2017   LDLCALC 80 01/02/2017   TRIG 158 01/02/2017   CHOLHDL 4.1 06/01/2012    Significant Diagnostic Results in last 30 days:  No results found.  Assessment/Plan  Late onset Alzheimer's disease with behavioral disturbance The behavior that he is having with personal care is not easily fixed with medication. He is still ambulatory and at risk for falls so I would avoid adding more medication or escalating doses unless the behavior worsens.   Hypertension Controlled. Continue Cozaar 25 mg qd and Toprol 25 mg xl qd.  Depression due to dementia Controlled.Continue zoloft 25 mg qd.   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 No aggressive measures due to age/dementia.   CKD (chronic kidney disease) stage 3, GFR 30-59 ml/min Continue to periodically monitor BMP and avoid nephrotoxic agents    Family/ staff Communication: discussed with staff/resident  Labs/tests ordered:  NA

## 2017-07-31 ENCOUNTER — Non-Acute Institutional Stay: Payer: Medicare Other | Admitting: Internal Medicine

## 2017-07-31 ENCOUNTER — Encounter: Payer: Self-pay | Admitting: Internal Medicine

## 2017-07-31 DIAGNOSIS — I255 Ischemic cardiomyopathy: Secondary | ICD-10-CM | POA: Diagnosis not present

## 2017-07-31 DIAGNOSIS — N183 Chronic kidney disease, stage 3 unspecified: Secondary | ICD-10-CM

## 2017-07-31 DIAGNOSIS — G301 Alzheimer's disease with late onset: Secondary | ICD-10-CM

## 2017-07-31 DIAGNOSIS — C674 Malignant neoplasm of posterior wall of bladder: Secondary | ICD-10-CM

## 2017-07-31 DIAGNOSIS — F0280 Dementia in other diseases classified elsewhere without behavioral disturbance: Secondary | ICD-10-CM | POA: Diagnosis not present

## 2017-07-31 DIAGNOSIS — I10 Essential (primary) hypertension: Secondary | ICD-10-CM

## 2017-07-31 DIAGNOSIS — F329 Major depressive disorder, single episode, unspecified: Secondary | ICD-10-CM | POA: Diagnosis not present

## 2017-07-31 DIAGNOSIS — F0393 Unspecified dementia, unspecified severity, with mood disturbance: Secondary | ICD-10-CM

## 2017-07-31 DIAGNOSIS — F0281 Dementia in other diseases classified elsewhere with behavioral disturbance: Secondary | ICD-10-CM | POA: Diagnosis not present

## 2017-07-31 NOTE — Progress Notes (Signed)
Patient ID: Damon Miller, male   DOB: 27-Jun-1919, 84 y.o.   MRN: [id number]  Location:  [facility] Room Number: 283 memory care Place of Service:  ALF [phone]) Provider:  ALF  Gayland Curry, DO  Patient Care Team: Gayland Curry, DO as PCP - General (Geriatric Medicine) [facility] [place], Denice Bors, MD as Consulting Physician (Cardiology) Jacolyn Reedy, MD as Consulting Physician (Cardiology) Tyler Pita, MD as Consulting Physician (Radiation Oncology) Allyn Kenner, MD as Consulting Physician (Dermatology) Ardis Hughs, MD as Attending Physician (Urology) Royal Hawthorn, NP as Nurse Practitioner (Nurse Practitioner)  Extended Emergency Contact Information Primary Emergency Contact: [place],"Wat" Tyrone [place] of [place] Phone: [phone] Relation: Son Secondary Emergency Contact: Edsel Petrin "Bab"          [place], [place] Mobile Phone: [phone] Relation: Daughter  Code Status:  DNR, living will and HCPOA, needs MOST form completed Goals of care: Advanced Directive information Advanced Directives 07/31/2017  Does Patient Have a Medical Advance Directive? Yes  Type of Paramedic of [place];Out of facility DNR (pink MOST or yellow form)  Does patient want to make changes to medical advance directive? No - Patient declined  Copy of [place] in Chart? Yes  Pre-existing out of facility DNR order (yellow form or pink MOST form) Yellow form placed in chart (order not valid for inpatient use)     Chief Complaint  Patient presents with  . Acute Visit    follow-up after bladder surgery    HPI:  Pt is a 84 y.o. male seen today for an acute visit for bladder surgery recommendations. Today's visit is for evaluation of possible bladder resectioning of a malignant neoplasm of the posterior wall of bladder. Dr. Louis Meckel is  planning to do a TURBT due to the recurrent tumor. This is pending the family's decision about surgery due to his advanced dementia.   Damon Miller has been progressing with increase needs with his ADL's as well as requiring more cues and increase in depression. Damon Miller is pleasant and kind in communication today at visit. His last MMSE was on 03/02/16 and was 20/30.   Furthermore with his cardiomyopathy and ejection fraction of 40-45% from 2014 when Damon Miller had suffered a NSTEMI. This could due to time and age potential be worse at this point in time. Damon Miller also has CKD stage III which requires close monitoring.    Past Medical History:  Diagnosis Date  . Abnormality of gait   . Allergic rhinitis due to pollen   . Arthritis   . CAD (coronary artery disease)   . Cataracts, bilateral   . Chronic kidney disease, stage II (mild)   . Dementia   . Diverticulosis   . DNR (do not resuscitate)   . Edema    wears compression hose  . GERD (gastroesophageal reflux disease)   . Glaucoma    Right eye  . Gout, unspecified   . Hernia, hiatal   . Hyperglycemia 2014  . Hyperlipemia   . Hypertension   . Hypothyroid   . Prostate cancer ([place])   . Vitamin D deficiency disease    Past Surgical History:  Procedure Laterality Date  . COLONOSCOPY  07/1990, 10/1992, 12/1995 and 02/1999   Polyps removed at each procedure  . EYE SURGERY Bilateral 1999   Cataract removal  . SKIN CANCER EXCISION  2004   Ears  . TRANSURETHRAL RESECTION OF BLADDER  TUMOR N/A 02/24/2016   Procedure: TRANSURETHRAL RESECTION OF BLADDER TUMOR (TURBT), BILATERAL RETROGRADE POLYGRAM;  Surgeon: Ardis Hughs, MD;  Location: [facility];  Service: Urology;  Laterality: N/A;    Allergies  Allergen Reactions  . Ibuprofen Rash    Outpatient Encounter Medications as of 07/31/2017  Medication Sig  . acetaminophen (TYLENOL) 325 MG tablet Take 650 mg by mouth 2 (two) times daily as needed.  Marland Kitchen allopurinol (ZYLOPRIM) 100 MG tablet TAKE 1 TABLET ONCE DAILY  TO LOWER URIC ACID.  Marland Kitchen atorvastatin (LIPITOR) 20 MG tablet Take 20 mg by mouth daily.  . cholecalciferol (VITAMIN D) 1000 UNITS tablet Take 2,000 Units by mouth daily.  . Emollient (EUCERIN ORIGINAL HEALING) LOTN Apply 1 application topically 2 (two) times daily.  . hydrocortisone cream 1 % Apply 1 application topically 4 (four) times daily as needed (applied to external hemorrhoids for pain/rectal bleeding.).  Marland Kitchen latanoprost (XALATAN) 0.005 % ophthalmic solution Place 1 drop into the right eye at bedtime.  Marland Kitchen levothyroxine (SYNTHROID, LEVOTHROID) 50 MCG tablet TAKE 1 TABLET BY MOUTH DAILY FOR THYROID SUPPLEMENT  . LORazepam (ATIVAN) 0.5 MG tablet Take 0.25 mg by mouth 2 (two) times a week. With showers  . losartan (COZAAR) 25 MG tablet Take 1 tablet (25 mg total) by mouth daily.  . metoprolol succinate (TOPROL XL) 25 MG 24 hr tablet Take 1 tablet (25 mg total) by mouth daily.  Marland Kitchen NAMENDA XR 28 MG CP24 24 hr capsule Take 28 mg by mouth every evening.   . nitroGLYCERIN (NITROSTAT) 0.4 MG SL tablet Place 1 tablet (0.4 mg total) under the tongue every 5 (five) minutes as needed for chest pain.  Marland Kitchen sertraline (ZOLOFT) 25 MG tablet Take 25 mg by mouth at bedtime.    No facility-administered encounter medications on file as of 07/31/2017.     Review of Systems  Constitutional: Negative for activity change, appetite change, fever and unexpected weight change.  Respiratory: Negative for cough, chest tightness and shortness of breath.   Cardiovascular: Negative for chest pain, palpitations and leg swelling.  Gastrointestinal: Negative for abdominal distention, abdominal pain and blood in stool.  Genitourinary: Negative for difficulty urinating, dysuria and hematuria.  Musculoskeletal: Positive for gait problem.  Skin: Negative for color change and pallor.  Neurological: Negative for dizziness, light-headedness and headaches.  Psychiatric/Behavioral: Positive for agitation, behavioral problems and  confusion.       Agitation only occurs during patient care    Immunization History  Administered Date(s) Administered  . Influenza Inj Mdck Quad Pf 03/02/2016  . Influenza Whole 02/13/2012  . Influenza-Unspecified 02/18/2014, 02/25/2015, 05/18/2017  . Pneumococcal Conjugate-13 10/06/2014  . Pneumococcal Polysaccharide-23 05/15/1998  . Tdap 11/28/2016  . Zoster 05/15/2006  . Zoster Recombinat (Shingrix) 04/10/2017   Pertinent  Health Maintenance Due  Topic Date Due  . INFLUENZA VACCINE  Completed  . PNA vac Low Risk Adult  Completed   Fall Risk  12/27/2016 11/28/2016 08/18/2015 04/21/2015 04/06/2014  Falls in the past year? No No No No Yes  Number falls in past yr: - - - - 1  Injury with Fall? - - - - Yes  Comment - - - - skin tear right lower arm   Functional Status Survey:    Vitals:   07/31/17 1205  BP: 138/72  Pulse: 83  Resp: 17  Temp: (!) 97.5 F (36.4 C)  TempSrc: Oral  SpO2: 95%  Weight: 198 lb (89.8 kg)   Body mass index is 29.24 kg/m. Physical  Exam  Constitutional: Damon Miller appears well-developed and well-nourished.  HENT:  Head: Normocephalic.  Cardiovascular: Normal rate, regular rhythm, normal heart sounds and intact distal pulses.  Pulmonary/Chest: Effort normal and breath sounds normal.  Abdominal: Soft. Bowel sounds are normal.  Neurological: Damon Miller is alert.  Oriented to self only   Skin: Skin is warm and dry.  Psychiatric: Damon Miller has a normal mood and affect. His speech is normal and behavior is normal. Cognition and memory are impaired.    Labs reviewed: Recent Labs    01/02/17 0700  NA 139  K 4.5  BUN 23*  CREATININE 1.4*   No results for input(s): AST, ALT, ALKPHOS, BILITOT, PROT, ALBUMIN in the last 8760 hours. Recent Labs    01/02/17 0700  WBC 6.7  HGB 13.3*  HCT 40*  PLT 208   Lab Results  Component Value Date   TSH 1.21 04/25/2017   Lab Results  Component Value Date   HGBA1C 6.1 02/14/2016   Lab Results  Component Value Date    CHOL 164 01/02/2017   HDL 53 01/02/2017   LDLCALC 80 01/02/2017   TRIG 158 01/02/2017   CHOLHDL 4.1 06/01/2012    Significant Diagnostic Results in last 30 days:  No results found.  Assessment/Plan 1. 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 Additionally, there appear to be several areas of concern and it is questionable as to whether or not Damon Miller would end up requiring more surgery in the future as well. Watchful waiting appears to be the best stand point. See below for details. Will call the son and daughter and speak with them regarding the concerns we have for proceeding with this invasive procedure.  2. Late onset Alzheimer's disease with behavioral disturbance Damon Miller has been progressing with increase needs with his ADL's as well as requiring more cues. Damon Miller also has an increase in depression. His last MMSE was on 03/02/16 and was 20/30. This is likely worse due to aging and time. Patients with dementia have a harder time post anesthesia. This is a concern for him and his outcomes post surgery.    3. Essential hypertension His BP is at goal, but Damon Miller is on a cozaar and toprol for this. Damon Miller will need to continue these to maintain this level.   4. Depression due to dementia Damon Miller is still on Zoloft for depression. Damon Miller has had ongoing depression, but is not as tearful as Damon Miller was several months ago.   5. CKD (chronic kidney disease) stage 3, GFR 30-59 ml/min (HCC) Additionally, Damon Miller also has CKD stage III which requires close monitoring. Maintaining hydration is vital as well as monitoring which medications are used during such an invasive procedure. Cozaar is held when fluid intake is poor.   6. Cardiomyopathy, ischemic Furthermore with his cardiomyopathy and ejection fraction of 87-56%, grade 1 diastolic dysfunction, hyopkinetic posterolateral and apical  myocardium, mild aortic regurg, and mild systolic CHF, as demonstrated on Echo from 2014 when Damon Miller had suffered a NSTEMI. This alone decreases the safety of placing him to sleep for this surgery. This most likely will have worsen over time rather than improved, especially with his decline in activity.    Family/ staff Communication: spoke with nursing  Labs/tests ordered:  none

## 2017-08-02 ENCOUNTER — Encounter: Payer: Self-pay | Admitting: Internal Medicine

## 2017-08-02 NOTE — ACP (Advance Care Planning) (Signed)
Damon Miller has been doing well overall, but has had declining function of significance over the past 2 years since his last bladder surgery.  He is now a bit agitated with his showers and not able to provide as good history.  Myself and [facility] staff are concerned about his ability to tolerate a surgery well due to anesthesia with his CKD, CAD with prior MI and systolic chf.  His son, Damon Miller, had asked for a phone call to discuss if his dad should undergo another TURBT procedure with Dr. Louis Meckel or just be monitored.  We discussed my concerns as above by phone.  We also discussed the ability to treat patients palliatively with symptom management if he develops bladder or urethral pain, bleeding, etc.  Damon Miller understands our concerns and says he and his sister have been on the fence about this, but leaning more away from the surgery anyway.  He will get back in touch with Dr Louis Meckel and they have a meeting about this tomorrow.  Plan is now to monitor him for symptoms and keep him comfortable due to his progressing dementia with functional decline as the primary reason not to put him thru hospital admission, anesthesia, location changes, medication changes, etc.   Pt also has HCPOA and DNR forms.  We should address the MOST form in the near future, as well.  Damon Miller said we can call him at anytime. 20 mins were spent on ACP--addressing goals of care including discussion with pt's son, review of his current condition vs previous condition at time of prior surgery.

## 2017-09-13 ENCOUNTER — Non-Acute Institutional Stay: Payer: Medicare Other | Admitting: Adult Health

## 2017-09-13 ENCOUNTER — Encounter: Payer: Self-pay | Admitting: Adult Health

## 2017-09-13 DIAGNOSIS — N183 Chronic kidney disease, stage 3 unspecified: Secondary | ICD-10-CM

## 2017-09-13 DIAGNOSIS — G301 Alzheimer's disease with late onset: Secondary | ICD-10-CM | POA: Diagnosis not present

## 2017-09-13 DIAGNOSIS — I10 Essential (primary) hypertension: Secondary | ICD-10-CM

## 2017-09-13 DIAGNOSIS — C674 Malignant neoplasm of posterior wall of bladder: Secondary | ICD-10-CM

## 2017-09-13 DIAGNOSIS — F0281 Dementia in other diseases classified elsewhere with behavioral disturbance: Secondary | ICD-10-CM

## 2017-09-13 DIAGNOSIS — F0393 Unspecified dementia, unspecified severity, with mood disturbance: Secondary | ICD-10-CM

## 2017-09-13 DIAGNOSIS — F0280 Dementia in other diseases classified elsewhere without behavioral disturbance: Secondary | ICD-10-CM

## 2017-09-13 DIAGNOSIS — I255 Ischemic cardiomyopathy: Secondary | ICD-10-CM | POA: Diagnosis not present

## 2017-09-13 DIAGNOSIS — F329 Major depressive disorder, single episode, unspecified: Secondary | ICD-10-CM

## 2017-09-13 DIAGNOSIS — F02818 Dementia in other diseases classified elsewhere, unspecified severity, with other behavioral disturbance: Secondary | ICD-10-CM

## 2017-09-13 NOTE — Assessment & Plan Note (Signed)
Continue to monitor for bleeding or s/s of discomfort. No further surgeries as recommended by Dr. Mariea Clonts.

## 2017-09-13 NOTE — Progress Notes (Signed)
Location:  Occupational psychologist of Service:  ALF (13) Provider:   Cindi Carbon, ANP [place] [phone]  Gayland Curry, DO  Patient Care Team: Gayland Curry, DO as PCP - General (Geriatric Medicine) [facility] [place], Denice Bors, MD as Consulting Physician (Cardiology) Jacolyn Reedy, MD as Consulting Physician (Cardiology) Tyler Pita, MD as Consulting Physician (Radiation Oncology) Allyn Kenner, MD as Consulting Physician (Dermatology) Ardis Hughs, MD as Attending Physician (Urology) Royal Hawthorn, NP as Nurse Practitioner (Nurse Practitioner)  Extended Emergency Contact Information Primary Emergency Contact: [place],"Wat" Tyrone [place] of [place] Phone: [phone] Relation: Son Secondary Emergency Contact: Edsel Petrin "Bab"          [place], [place] Phone: [phone] Relation: Daughter  Code Status:  DNR Goals of care: Advanced Directive information Advanced Directives 07/31/2017  Does Patient Have a Medical Advance Directive? Yes  Type of Paramedic of [place];Out of facility DNR (pink MOST or yellow form)  Does patient want to make changes to medical advance directive? No - Patient declined  Copy of [place] in Chart? Yes  Pre-existing out of facility DNR order (yellow form or pink MOST form) Yellow form placed in chart (order not valid for inpatient use)     Chief Complaint  Patient presents with  . Medical Management of Chronic Issues    HPI:  Pt is a 84 y.o. male seen today for medical management of chronic diseases.  He resides in memory care due advanced dementia. He was seen by Dr. Mariea Clonts in March to evaluate him for possible bladder surgery related to bladder cancer. Due to his multiple co-morbidities it was felt that he was not a good candidate for surgery.  The nurse reports that he has  been in bed all day and has not wanted to come out and interact with others which is unusual for him. He has no other complaints but is an overall poor historian with an MMSE of 19/30 on 09/06/17.   The staff report that he is voiding normally and has not had any bladder pain or bleeding. In review of VS in matrix his BP have been rather low and not reported to me ranging 29-476 systolic. His weight has remained stable . The nurse reports that he ate well today despite not wanting to come out of his room.  Wt Readings from Last 3 Encounters:  09/13/17 197 lb 3.2 oz (89.4 kg)  07/31/17 198 lb (89.8 kg)  07/12/17 195 lb 6.4 oz (88.6 kg)   Ischemic cardiomyopathy: EF 40-45% no reports of DOE, PND, increased edema Currently on toprol and losartan  Past Medical History:  Diagnosis Date  . Abnormality of gait   . Allergic rhinitis due to pollen   . Arthritis   . CAD (coronary artery disease)   . Cataracts, bilateral   . Chronic kidney disease, stage II (mild)   . Dementia   . Diverticulosis   . DNR (do not resuscitate)   . Edema    wears compression hose  . GERD (gastroesophageal reflux disease)   . Glaucoma    Right eye  . Gout, unspecified   . Hernia, hiatal   . Hyperglycemia 2014  . Hyperlipemia   . Hypertension   . Hypothyroid   . Prostate cancer ([place])   . Vitamin D deficiency disease    Past Surgical History:  Procedure Laterality Date  .  COLONOSCOPY  07/1990, 10/1992, 12/1995 and 02/1999   Polyps removed at each procedure  . EYE SURGERY Bilateral 1999   Cataract removal  . SKIN CANCER EXCISION  2004   Ears  . TRANSURETHRAL RESECTION OF BLADDER TUMOR N/A 02/24/2016   Procedure: TRANSURETHRAL RESECTION OF BLADDER TUMOR (TURBT), BILATERAL RETROGRADE POLYGRAM;  Surgeon: Ardis Hughs, MD;  Location: [facility];  Service: Urology;  Laterality: N/A;    Allergies  Allergen Reactions  . Ibuprofen Rash    Outpatient Encounter Medications as of 09/13/2017  Medication Sig  .  acetaminophen (TYLENOL) 325 MG tablet Take 650 mg by mouth 2 (two) times daily as needed.  Marland Kitchen allopurinol (ZYLOPRIM) 100 MG tablet TAKE 1 TABLET ONCE DAILY TO LOWER URIC ACID.  Marland Kitchen atorvastatin (LIPITOR) 20 MG tablet Take 20 mg by mouth daily.  . cholecalciferol (VITAMIN D) 1000 UNITS tablet Take 2,000 Units by mouth daily.  . Emollient (EUCERIN ORIGINAL HEALING) LOTN Apply 1 application topically 2 (two) times daily.  . hydrocortisone cream 1 % Apply 1 application topically 4 (four) times daily as needed (applied to external hemorrhoids for pain/rectal bleeding.).  Marland Kitchen latanoprost (XALATAN) 0.005 % ophthalmic solution Place 1 drop into the right eye at bedtime.  Marland Kitchen levothyroxine (SYNTHROID, LEVOTHROID) 50 MCG tablet TAKE 1 TABLET BY MOUTH DAILY FOR THYROID SUPPLEMENT  . LORazepam (ATIVAN) 0.5 MG tablet Take 0.25 mg by mouth 2 (two) times a week. With showers  . losartan (COZAAR) 25 MG tablet Take 1 tablet (25 mg total) by mouth daily.  . metoprolol succinate (TOPROL XL) 25 MG 24 hr tablet Take 1 tablet (25 mg total) by mouth daily.  Marland Kitchen NAMENDA XR 28 MG CP24 24 hr capsule Take 28 mg by mouth every evening.   . nitroGLYCERIN (NITROSTAT) 0.4 MG SL tablet Place 1 tablet (0.4 mg total) under the tongue every 5 (five) minutes as needed for chest pain.  Marland Kitchen sertraline (ZOLOFT) 25 MG tablet Take 25 mg by mouth at bedtime.    No facility-administered encounter medications on file as of 09/13/2017.     Review of Systems  Constitutional: Positive for activity change. Negative for appetite change, chills, diaphoresis, fatigue, fever and unexpected weight change.  Respiratory: Negative for cough, shortness of breath, wheezing and stridor.   Cardiovascular: Positive for leg swelling. Negative for chest pain and palpitations.  Gastrointestinal: Negative for abdominal distention, abdominal pain, constipation and diarrhea.  Genitourinary: Negative for decreased urine volume, difficulty urinating, discharge, dysuria,  enuresis, flank pain, frequency, genital sores, hematuria and penile pain.       Regularly urinates per the staff. No bleeding noted. Incontinent intermittently.   Musculoskeletal: Positive for gait problem. Negative for arthralgias, back pain, joint swelling and myalgias.  Neurological: Negative for dizziness, seizures, syncope, facial asymmetry, speech difficulty, weakness and headaches.  Hematological: Negative for adenopathy. Does not bruise/bleed easily.  Psychiatric/Behavioral: Positive for confusion. Negative for agitation and behavioral problems. The patient is nervous/anxious.     Immunization History  Administered Date(s) Administered  . Influenza Inj Mdck Quad Pf 03/02/2016  . Influenza Whole 02/13/2012  . Influenza-Unspecified 02/18/2014, 02/25/2015, 05/18/2017  . Pneumococcal Conjugate-13 10/06/2014  . Pneumococcal Polysaccharide-23 05/15/1998  . Tdap 11/28/2016  . Zoster 05/15/2006  . Zoster Recombinat (Shingrix) 04/10/2017   Pertinent  Health Maintenance Due  Topic Date Due  . INFLUENZA VACCINE  12/13/2017  . PNA vac Low Risk Adult  Completed   Fall Risk  12/27/2016 11/28/2016 08/18/2015 04/21/2015 04/06/2014  Falls in the past year?  No No No No Yes  Number falls in past yr: - - - - 1  Injury with Fall? - - - - Yes  Comment - - - - skin tear right lower arm   Functional Status Survey:    Vitals:   09/13/17 1636  BP: 97/72  Pulse: 84  Resp: (!) 23  Temp: (!) 97 F (36.1 C)  SpO2: 98%  Weight: 197 lb 3.2 oz (89.4 kg)   Body mass index is 29.12 kg/m. Physical Exam  Constitutional: He is oriented to person, place, and time. No distress.  HENT:  Head: Normocephalic and atraumatic.  Nose: Nose normal.  Mouth/Throat: Oropharynx is clear and moist. No oropharyngeal exudate.  Neck: No JVD present. No tracheal deviation present. No thyromegaly present.  Cardiovascular: Normal rate and regular rhythm.  No murmur heard. BLE ankle edema  Pulmonary/Chest: Effort  normal and breath sounds normal. No respiratory distress. He has no wheezes.  Abdominal: Soft. Bowel sounds are normal. He exhibits no distension. There is no tenderness.  Musculoskeletal: He exhibits edema (left hand with no warmth). He exhibits no deformity.  Lymphadenopathy:    He has no cervical adenopathy.  Neurological: He is alert and oriented to person, place, and time. No cranial nerve deficit.  Skin: Skin is warm and dry. He is not diaphoretic.  Psychiatric: He has a normal mood and affect.  Nursing note and vitals reviewed.   Labs reviewed: Recent Labs    01/02/17 0700  NA 139  K 4.5  BUN 23*  CREATININE 1.4*   No results for input(s): AST, ALT, ALKPHOS, BILITOT, PROT, ALBUMIN in the last 8760 hours. Recent Labs    01/02/17 0700  WBC 6.7  HGB 13.3*  HCT 40*  PLT 208   Lab Results  Component Value Date   TSH 1.21 04/25/2017   Lab Results  Component Value Date   HGBA1C 6.1 02/14/2016   Lab Results  Component Value Date   CHOL 164 01/02/2017   HDL 53 01/02/2017   LDLCALC 80 01/02/2017   TRIG 158 01/02/2017   CHOLHDL 4.1 06/01/2012    Significant Diagnostic Results in last 30 days:  No results found.  Assessment/Plan  Late onset Alzheimer's disease with behavioral disturbance Resides in memory unit and seems to be doing well other than today when he did not want to come out of his room. Continue zoloft and prn ativan to help with anxiety surrounding showers.   Depression due to dementia Continues to have anxiety about showers.  See above  Hypertension Very low BPs recorded, d/c losartan  Malignant neoplasm of posterior wall of urinary bladder ([place]) Continue to monitor for bleeding or s/s of discomfort. No further surgeries as recommended by Dr. Mariea Clonts.    CKD (chronic kidney disease) stage 3, GFR 30-59 ml/min Continue to periodically monitor BMP and avoid nephrotoxic agents   Cardiomyopathy, ischemic His weight is stable with no reports of DOE  or chest pain. Will continue Toprol 25 mg xl but if BP drops again will need to discontinue it.  Monitor VS BID x 72 hrs due to low BP and not coming out of his room. ? If he feels ill but can not express it.   Family/ staff Communication: discussed with the  nurse  Labs/tests ordered:  CBC BMP

## 2017-09-13 NOTE — Assessment & Plan Note (Signed)
Continue to periodically monitor BMP and avoid nephrotoxic agents

## 2017-09-13 NOTE — Assessment & Plan Note (Addendum)
Continues to have anxiety about showers.  See above

## 2017-09-13 NOTE — Assessment & Plan Note (Signed)
Resides in memory unit and seems to be doing well other than today when he did not want to come out of his room. Continue zoloft and prn ativan to help with anxiety surrounding showers.

## 2017-09-13 NOTE — Assessment & Plan Note (Signed)
His weight is stable with no reports of DOE or chest pain. Will continue Toprol 25 mg xl but if BP drops again will need to discontinue it.

## 2017-09-13 NOTE — Assessment & Plan Note (Signed)
Very low BPs recorded, d/c losartan

## 2017-09-14 DIAGNOSIS — C674 Malignant neoplasm of posterior wall of bladder: Secondary | ICD-10-CM | POA: Diagnosis not present

## 2017-09-14 DIAGNOSIS — C679 Malignant neoplasm of bladder, unspecified: Secondary | ICD-10-CM | POA: Diagnosis not present

## 2017-09-14 LAB — HEPATIC FUNCTION PANEL
ALT: 12 (ref 10–40)
AST: 17 (ref 14–40)
Alkaline Phosphatase: 89 (ref 25–125)
Bilirubin, Total: 0.6

## 2017-09-14 LAB — CBC AND DIFFERENTIAL
HEMATOCRIT: 39 — AB (ref 41–53)
Hemoglobin: 13.1 — AB (ref 13.5–17.5)
Platelets: 224 (ref 150–399)
WBC: 6.9

## 2017-09-14 LAB — BASIC METABOLIC PANEL
BUN: 25 — AB (ref 4–21)
Creatinine: 1.4 — AB (ref 0.6–1.3)
GLUCOSE: 93
POTASSIUM: 4.4 (ref 3.4–5.3)
Sodium: 139 (ref 137–147)

## 2017-09-18 DIAGNOSIS — D649 Anemia, unspecified: Secondary | ICD-10-CM | POA: Diagnosis not present

## 2017-09-18 DIAGNOSIS — D7589 Other specified diseases of blood and blood-forming organs: Secondary | ICD-10-CM | POA: Diagnosis not present

## 2017-09-18 DIAGNOSIS — D519 Vitamin B12 deficiency anemia, unspecified: Secondary | ICD-10-CM | POA: Diagnosis not present

## 2017-09-18 LAB — VITAMIN B12: VITAMIN B 12: 313

## 2017-11-14 ENCOUNTER — Non-Acute Institutional Stay (SKILLED_NURSING_FACILITY): Payer: Medicare Other | Admitting: Internal Medicine

## 2017-11-14 ENCOUNTER — Encounter: Payer: Self-pay | Admitting: Internal Medicine

## 2017-11-14 DIAGNOSIS — J069 Acute upper respiratory infection, unspecified: Secondary | ICD-10-CM | POA: Diagnosis not present

## 2017-11-14 NOTE — Progress Notes (Signed)
Patient ID: Damon Miller, male   DOB: 1919/10/07, 84 y.o.   MRN: [id number]  Location:  [facility] Room Number: 914 memory care Place of Service:  SNF [phone]) Provider:   Gayland Curry, DO  Patient Care Team: Gayland Curry, DO as PCP - General (Geriatric Medicine) Community, [facility] [place], Denice Bors, MD as Consulting Physician (Cardiology) Jacolyn Reedy, MD as Consulting Physician (Cardiology) Tyler Pita, MD as Consulting Physician (Radiation Oncology) Allyn Kenner, MD as Consulting Physician (Dermatology) Ardis Hughs, MD as Attending Physician (Urology) Royal Hawthorn, NP as Nurse Practitioner (Nurse Practitioner)  Extended Emergency Contact Information Primary Emergency Contact: Hodes,"Wat" [place] Phone: [phone] Relation: Son Secondary Emergency Contact: Edsel Petrin "Bab"          [place], [place] Phone: [phone] Relation: Daughter  Code Status:  DNR Goals of care: Advanced Directive information Advanced Directives 11/14/2017  Does Patient Have a Medical Advance Directive? Yes  Type of Paramedic of [place];Living will;Out of facility DNR (pink MOST or yellow form)  Does patient want to make changes to medical advance directive? No - Patient declined  Copy of [place] in Chart? Yes  Pre-existing out of facility DNR order (yellow form or pink MOST form) Yellow form placed in chart (order not valid for inpatient use)     Chief Complaint  Patient presents with  . Acute Visit    cough    HPI:  Pt is a 84 y.o. male seen today for an acute visit for congested cough and slight wheeze on his right side noted 11/13/17.  He'd been coughing since 6/28, was only eating 25% of meals, and refusing to get out of bed.  He's gotten three days of robitussin for the cough.  Sats 92% RA.    Bowels are  moving with last bm large 11/10/17.  When seen, he was actually up OOB sitting in his chair in his apt.  He was coughing, dry cough.  He denied shortness of breath and staff do not report this.  He also ate his breakfast this morning except his orange juice.  He reports, "I feel better."   Past Medical History:  Diagnosis Date  . Abnormality of gait   . Allergic rhinitis due to pollen   . Arthritis   . CAD (coronary artery disease)   . Cataracts, bilateral   . Chronic kidney disease, stage II (mild)   . Dementia   . Diverticulosis   . DNR (do not resuscitate)   . Edema    wears compression hose  . GERD (gastroesophageal reflux disease)   . Glaucoma    Right eye  . Gout, unspecified   . Hernia, hiatal   . Hyperglycemia 2014  . Hyperlipemia   . Hypertension   . Hypothyroid   . Prostate cancer ([place])   . Vitamin D deficiency disease    Past Surgical History:  Procedure Laterality Date  . COLONOSCOPY  07/1990, 10/1992, 12/1995 and 02/1999   Polyps removed at each procedure  . EYE SURGERY Bilateral 1999   Cataract removal  . SKIN CANCER EXCISION  2004   Ears  . TRANSURETHRAL RESECTION OF BLADDER TUMOR N/A 02/24/2016   Procedure: TRANSURETHRAL RESECTION OF BLADDER TUMOR (TURBT), BILATERAL RETROGRADE POLYGRAM;  Surgeon: Ardis Hughs, MD;  Location: [facility];  Service: Urology;  Laterality: N/A;    Allergies  Allergen Reactions  .  Ibuprofen Rash    Outpatient Encounter Medications as of 11/14/2017  Medication Sig  . acetaminophen (TYLENOL) 325 MG tablet Take 650 mg by mouth 2 (two) times daily as needed.  Marland Kitchen allopurinol (ZYLOPRIM) 100 MG tablet TAKE 1 TABLET ONCE DAILY TO LOWER URIC ACID.  Marland Kitchen atorvastatin (LIPITOR) 20 MG tablet Take 20 mg by mouth daily.  . cholecalciferol (VITAMIN D) 1000 UNITS tablet Take 2,000 Units by mouth daily.  Marland Kitchen dextromethorphan-guaiFENesin (ROBAFEN DM CGH/CHEST CONGEST) 10-100 MG/5ML liquid Take 10 mLs by mouth every 6 (six) hours as needed for cough.    . Emollient (EUCERIN ORIGINAL HEALING) LOTN Apply 1 application topically 2 (two) times daily.  Marland Kitchen latanoprost (XALATAN) 0.005 % ophthalmic solution Place 1 drop into the right eye at bedtime.  Marland Kitchen levothyroxine (SYNTHROID, LEVOTHROID) 50 MCG tablet TAKE 1 TABLET BY MOUTH DAILY FOR THYROID SUPPLEMENT  . LORazepam (ATIVAN) 0.5 MG tablet Take 0.25 mg by mouth 2 (two) times a week. With showers  . metoprolol succinate (TOPROL XL) 25 MG 24 hr tablet Take 1 tablet (25 mg total) by mouth daily.  Marland Kitchen NAMENDA XR 28 MG CP24 24 hr capsule Take 28 mg by mouth every evening.   . nitroGLYCERIN (NITROSTAT) 0.4 MG SL tablet Place 1 tablet (0.4 mg total) under the tongue every 5 (five) minutes as needed for chest pain.  Marland Kitchen sertraline (ZOLOFT) 25 MG tablet Take 25 mg by mouth at bedtime.   . [DISCONTINUED] hydrocortisone cream 1 % Apply 1 application topically 4 (four) times daily as needed (applied to external hemorrhoids for pain/rectal bleeding.).  . [DISCONTINUED] losartan (COZAAR) 25 MG tablet Take 1 tablet (25 mg total) by mouth daily.   No facility-administered encounter medications on file as of 11/14/2017.     Review of Systems  Constitutional: Positive for activity change, appetite change and fatigue. Negative for chills and fever.  HENT: Positive for congestion. Negative for sore throat and voice change.   Eyes:       Glasses  Respiratory: Positive for cough and wheezing. Negative for shortness of breath.   Cardiovascular: Negative for chest pain, palpitations and leg swelling.  Gastrointestinal: Negative for abdominal pain.  Genitourinary: Negative for dysuria.  Musculoskeletal: Positive for gait problem.       Uses walker  Neurological: Negative for dizziness and weakness.  Psychiatric/Behavioral: Positive for confusion.       Poor historian, got some history of nursing and CNAs    Immunization History  Administered Date(s) Administered  . Influenza Inj Mdck Quad Pf 03/02/2016  . Influenza  Whole 02/13/2012  . Influenza-Unspecified 02/18/2014, 02/25/2015, 05/18/2017  . Pneumococcal Conjugate-13 10/06/2014  . Pneumococcal Polysaccharide-23 05/15/1998  . Tdap 11/28/2016  . Zoster 05/15/2006  . Zoster Recombinat (Shingrix) 04/10/2017, 08/07/2017   Pertinent  Health Maintenance Due  Topic Date Due  . INFLUENZA VACCINE  12/13/2017  . PNA vac Low Risk Adult  Completed   Fall Risk  12/27/2016 11/28/2016 08/18/2015 04/21/2015 04/06/2014  Falls in the past year? No No No No Yes  Number falls in past yr: - - - - 1  Injury with Fall? - - - - Yes  Comment - - - - skin tear right lower arm   Functional Status Survey:    Vitals:   11/14/17 0916  BP: 127/64  Pulse: 72  Resp: 20  Temp: (!) 97.5 F (36.4 C)  TempSrc: Oral  SpO2: 92%  Weight: 199 lb (90.3 kg)  Height: 5\' 9"  (1.753 m)   Body  mass index is 29.39 kg/m. Physical Exam  Constitutional: He appears well-developed and well-nourished. No distress.  HENT:  Head: Normocephalic and atraumatic.  Red nose, slightly stuffy sounding  Cardiovascular: Normal rate, regular rhythm, normal heart sounds and intact distal pulses.  Pulmonary/Chest: Effort normal and breath sounds normal. No stridor. No respiratory distress. He has no wheezes. He has no rales.  CTA today  Abdominal: Soft. Bowel sounds are normal.  Musculoskeletal: Normal range of motion.  Skin: Skin is warm and dry.  Psychiatric: He has a normal mood and affect.    Labs reviewed: Recent Labs    01/02/17 0700  NA 139  K 4.5  BUN 23*  CREATININE 1.4*   No results for input(s): AST, ALT, ALKPHOS, BILITOT, PROT, ALBUMIN in the last 8760 hours. Recent Labs    01/02/17 0700  WBC 6.7  HGB 13.3*  HCT 40*  PLT 208   Lab Results  Component Value Date   TSH 1.21 04/25/2017   Lab Results  Component Value Date   HGBA1C 6.1 02/14/2016   Lab Results  Component Value Date   CHOL 164 01/02/2017   HDL 53 01/02/2017   LDLCALC 80 01/02/2017   TRIG 158  01/02/2017   CHOLHDL 4.1 06/01/2012    Assessment/Plan 1. Viral upper respiratory tract infection -seems he's getting over his infection -may continue cough syrup if needed -encourage incentive spirometer 10 breaths tid to prevent pneumonia -encourage po intake and hydration -encourage OOB to chair daily -monitor to ensure this is not an aspiration problem with right base wheezes noted yesterday by nursing -monitor po intake which seems to be improving  Family/ staff Communication: discussed with memory care nurse, CNAs and nurse manager  Labs/tests ordered:  None  Berlene Dixson L. Niels Cranshaw, D.O. [facility] [address], [place] [address] Cell Phone (Mon-Fri 8am-5pm):  [phone] On Call:  [phone] & follow prompts after 5pm & weekends Office Phone:  [phone] Office Fax:  [phone]

## 2017-11-28 ENCOUNTER — Non-Acute Institutional Stay (SKILLED_NURSING_FACILITY): Payer: Medicare Other

## 2017-11-28 ENCOUNTER — Other Ambulatory Visit: Payer: Self-pay

## 2017-11-28 DIAGNOSIS — Z0000 Encounter for general adult medical examination without abnormal findings: Secondary | ICD-10-CM

## 2017-11-28 NOTE — Patient Instructions (Signed)
[name] , Thank you for taking time to come for your Medicare Wellness Visit. I appreciate your ongoing commitment to your health goals. Please review the following plan we discussed and let me know if I can assist you in the future.   Screening recommendations/referrals: Colonoscopy excluded, over age 82 Recommended yearly ophthalmology/optometry visit for glaucoma screening and checkup Recommended yearly dental visit for hygiene and checkup  Vaccinations: Influenza vaccine up to date, due 2019 fall season Pneumococcal vaccine up to date, completed Tdap vaccine up to date, due 11/29/2026 Shingles vaccine up to date, completed    Advanced directives: in chart  Conditions/risks identified: none  Next appointment: Dr. Mariea Clonts makes rounds  Preventive Care 70 Years and Older, Male Preventive care refers to lifestyle choices and visits with your health care provider that can promote health and wellness. What does preventive care include?  A yearly physical exam. This is also called an annual well check.  Dental exams once or twice a year.  Routine eye exams. Ask your health care provider how often you should have your eyes checked.  Personal lifestyle choices, including:  Daily care of your teeth and gums.  Regular physical activity.  Eating a healthy diet.  Avoiding tobacco and drug use.  Limiting alcohol use.  Practicing safe sex.  Taking low doses of aspirin every day.  Taking vitamin and mineral supplements as recommended by your health care provider. What happens during an annual well check? The services and screenings done by your health care provider during your annual well check will depend on your age, overall health, lifestyle risk factors, and family history of disease. Counseling  Your health care provider may ask you questions about your:  Alcohol use.  Tobacco use.  Drug use.  Emotional well-being.  Home and relationship well-being.  Sexual  activity.  Eating habits.  History of falls.  Memory and ability to understand (cognition).  Work and work Statistician. Screening  You may have the following tests or measurements:  Height, weight, and BMI.  Blood pressure.  Lipid and cholesterol levels. These may be checked every 5 years, or more frequently if you are over 71 years old.  Skin check.  Lung cancer screening. You may have this screening every year starting at age 57 if you have a 30-pack-year history of smoking and currently smoke or have quit within the past 15 years.  Fecal occult blood test (FOBT) of the stool. You may have this test every year starting at age 26.  Flexible sigmoidoscopy or colonoscopy. You may have a sigmoidoscopy every 5 years or a colonoscopy every 10 years starting at age 60.  Prostate cancer screening. Recommendations will vary depending on your family history and other risks.  Hepatitis C blood test.  Hepatitis B blood test.  Sexually transmitted disease (STD) testing.  Diabetes screening. This is done by checking your blood sugar (glucose) after you have not eaten for a while (fasting). You may have this done every 1-3 years.  Abdominal aortic aneurysm (AAA) screening. You may need this if you are a current or former smoker.  Osteoporosis. You may be screened starting at age 75 if you are at high risk. Talk with your health care provider about your test results, treatment options, and if necessary, the need for more tests. Vaccines  Your health care provider may recommend certain vaccines, such as:  Influenza vaccine. This is recommended every year.  Tetanus, diphtheria, and acellular pertussis (Tdap, Td) vaccine. You may need a  Td booster every 10 years.  Zoster vaccine. You may need this after age 86.  Pneumococcal 13-valent conjugate (PCV13) vaccine. One dose is recommended after age 70.  Pneumococcal polysaccharide (PPSV23) vaccine. One dose is recommended after age  69. Talk to your health care provider about which screenings and vaccines you need and how often you need them. This information is not intended to replace advice given to you by your health care provider. Make sure you discuss any questions you have with your health care provider. Document Released: 05/28/2015 Document Revised: 01/19/2016 Document Reviewed: 03/02/2015 Elsevier Interactive Patient Education  2017 [place] Prevention in the Home Falls can cause injuries. They can happen to people of all ages. There are many things you can do to make your home safe and to help prevent falls. What can I do on the outside of my home?  Regularly fix the edges of walkways and driveways and fix any cracks.  Remove anything that might make you trip as you walk through a door, such as a raised step or threshold.  Trim any bushes or trees on the path to your home.  Use bright outdoor lighting.  Clear any walking paths of anything that might make someone trip, such as rocks or tools.  Regularly check to see if handrails are loose or broken. Make sure that both sides of any steps have handrails.  Any raised decks and porches should have guardrails on the edges.  Have any leaves, snow, or ice cleared regularly.  Use sand or salt on walking paths during winter.  Clean up any spills in your garage right away. This includes oil or grease spills. What can I do in the bathroom?  Use night lights.  Install grab bars by the toilet and in the tub and shower. Do not use towel bars as grab bars.  Use non-skid mats or decals in the tub or shower.  If you need to sit down in the shower, use a plastic, non-slip stool.  Keep the floor dry. Clean up any water that spills on the floor as soon as it happens.  Remove soap buildup in the tub or shower regularly.  Attach bath mats securely with double-sided non-slip rug tape.  Do not have throw rugs and other things on the floor that can make  you trip. What can I do in the bedroom?  Use night lights.  Make sure that you have a light by your bed that is easy to reach.  Do not use any sheets or blankets that are too big for your bed. They should not hang down onto the floor.  Have a firm chair that has side arms. You can use this for support while you get dressed.  Do not have throw rugs and other things on the floor that can make you trip. What can I do in the kitchen?  Clean up any spills right away.  Avoid walking on wet floors.  Keep items that you use a lot in easy-to-reach places.  If you need to reach something above you, use a strong step stool that has a grab bar.  Keep electrical cords out of the way.  Do not use floor polish or wax that makes floors slippery. If you must use wax, use non-skid floor wax.  Do not have throw rugs and other things on the floor that can make you trip. What can I do with my stairs?  Do not leave any items on the stairs.  Make sure that there are handrails on both sides of the stairs and use them. Fix handrails that are broken or loose. Make sure that handrails are as long as the stairways.  Check any carpeting to make sure that it is firmly attached to the stairs. Fix any carpet that is loose or worn.  Avoid having throw rugs at the top or bottom of the stairs. If you do have throw rugs, attach them to the floor with carpet tape.  Make sure that you have a light switch at the top of the stairs and the bottom of the stairs. If you do not have them, ask someone to add them for you. What else can I do to help prevent falls?  Wear shoes that:  Do not have high heels.  Have rubber bottoms.  Are comfortable and fit you well.  Are closed at the toe. Do not wear sandals.  If you use a stepladder:  Make sure that it is fully opened. Do not climb a closed stepladder.  Make sure that both sides of the stepladder are locked into place.  Ask someone to hold it for you, if  possible.  Clearly mark and make sure that you can see:  Any grab bars or handrails.  First and last steps.  Where the edge of each step is.  Use tools that help you move around (mobility aids) if they are needed. These include:  Canes.  Walkers.  Scooters.  Crutches.  Turn on the lights when you go into a dark area. Replace any light bulbs as soon as they burn out.  Set up your furniture so you have a clear path. Avoid moving your furniture around.  If any of your floors are uneven, fix them.  If there are any pets around you, be aware of where they are.  Review your medicines with your doctor. Some medicines can make you feel dizzy. This can increase your chance of falling. Ask your doctor what other things that you can do to help prevent falls. This information is not intended to replace advice given to you by your health care provider. Make sure you discuss any questions you have with your health care provider. Document Released: 02/25/2009 Document Revised: 10/07/2015 Document Reviewed: 06/05/2014 Elsevier Interactive Patient Education  2017 [employer].

## 2017-11-28 NOTE — Progress Notes (Signed)
Subjective:   Damon Miller is a 84 y.o. male who presents for Medicare Annual/Subsequent preventive examination at [facility]  Last AWV-11/28/2016    Objective:    Vitals: BP 135/60 (BP Location: Left Arm, Patient Position: Sitting)   Pulse (!) 58   Temp (!) 97.5 F (36.4 C) (Oral)   Ht 5\' 9"  (1.753 m)   Wt 199 lb (90.3 kg)   BMI 29.39 kg/m   Body mass index is 29.39 kg/m.  Advanced Directives 11/28/2017 11/14/2017 07/31/2017 04/10/2017 03/06/2017 12/27/2016 11/28/2016  Does Patient Have a Medical Advance Directive? Yes Yes Yes Yes Yes Yes Yes  Type of Paramedic of [place];Living will;Out of facility DNR (pink MOST or yellow form) [employer];Living will;Out of facility DNR (pink MOST or yellow form) [place];Out of facility DNR (pink MOST or yellow form) Out of facility DNR (pink MOST or yellow form);Healthcare Power of [employer] of facility DNR (pink MOST or yellow form);Healthcare Power of [employer] of facility DNR (pink MOST or yellow form);Healthcare Power of [place];Living will;Out of facility DNR (pink MOST or yellow form)  Does patient want to make changes to medical advance directive? No - Patient declined No - Patient declined No - Patient declined No - Patient declined - - No - Patient declined  Copy of [place] in Chart? Yes Yes Yes Yes Yes Yes Yes  Pre-existing out of facility DNR order (yellow form or pink MOST form) Yellow form placed in chart (order not valid for inpatient use) Yellow form placed in chart (order not valid for inpatient use) Yellow form placed in chart (order not valid for inpatient use) Yellow form placed in chart (order not valid for inpatient use) Yellow form placed in chart (order not valid for inpatient use) - -    Tobacco Social History   Tobacco Use  Smoking Status Former Smoker  . Last attempt to quit: 05/16/1987  .  Years since quitting: 30.5  Smokeless Tobacco Never Used     Counseling given: Not Answered   Clinical Intake:  Pre-visit preparation completed: No  Pain : No/denies pain     Nutritional Risks: None Diabetes: No  How often do you need to have someone help you when you read instructions, pamphlets, or other written materials from your doctor or pharmacy?: 3 - Sometimes What is the last grade level you completed in school?: College  Interpreter Needed?: No  Information entered by :: Tyson Dense, RN  Past Medical History:  Diagnosis Date  . Abnormality of gait   . Allergic rhinitis due to pollen   . Arthritis   . CAD (coronary artery disease)   . Cataracts, bilateral   . Chronic kidney disease, stage II (mild)   . Dementia   . Diverticulosis   . DNR (do not resuscitate)   . Edema    wears compression hose  . GERD (gastroesophageal reflux disease)   . Glaucoma    Right eye  . Gout, unspecified   . Hernia, hiatal   . Hyperglycemia 2014  . Hyperlipemia   . Hypertension   . Hypothyroid   . Prostate cancer ([place])   . Vitamin D deficiency disease    Past Surgical History:  Procedure Laterality Date  . COLONOSCOPY  07/1990, 10/1992, 12/1995 and 02/1999   Polyps removed at each procedure  . EYE SURGERY Bilateral 1999   Cataract removal  . SKIN CANCER EXCISION  2004   Ears  . TRANSURETHRAL RESECTION OF BLADDER TUMOR N/A 02/24/2016   Procedure: TRANSURETHRAL RESECTION OF BLADDER TUMOR (TURBT), BILATERAL RETROGRADE POLYGRAM;  Surgeon: Ardis Hughs, MD;  Location: [facility];  Service: Urology;  Laterality: N/A;   Family History  Problem Relation Age of Onset  . Heart attack Father    Social History   Socioeconomic History  . Marital status: Widowed    Spouse name: Not on file  . Number of children: Not on file  . Years of education: Not on file  . Highest education level: Not on file  Occupational History  . Occupation: retired from [employer]  . Financial resource strain: Not hard at all  . Food insecurity:    Worry: Never true    Inability: Never true  . Transportation needs:    Medical: No    Non-medical: No  Tobacco Use  . Smoking status: Former Smoker    Last attempt to quit: 05/16/1987    Years since quitting: 30.5  . Smokeless tobacco: Never Used  Substance and Sexual Activity  . Alcohol use: Not Currently    Alcohol/week: 3.0 oz    Types: 5 Shots of liquor per week    Comment: Drinks one [employer] couple nights a week  . Drug use: No  . Sexual activity: Never  Lifestyle  . Physical activity:    Days per week: 0 days    Minutes per session: 0 min  . Stress: Not at all  Relationships  . Social connections:    Talks on phone: Three times a week    Gets together: Three times a week    Attends religious service: Never    Active member of club or organization: No    Attends meetings of clubs or organizations: Never    Relationship status: Widowed  Other Topics Concern  . Not on file  Social History Narrative   Lives at [facility]   Widowed   DNR/POA/Living Will   Exercise: walking , plays bingo   Walks with walker   Stopped smoking 1989   Alcohol on [place] couple nights a week              Outpatient Encounter Medications as of 11/28/2017  Medication Sig  . acetaminophen (TYLENOL) 325 MG tablet Take 650 mg by mouth 2 (two) times daily as needed.  Marland Kitchen allopurinol (ZYLOPRIM) 100 MG tablet TAKE 1 TABLET ONCE DAILY TO LOWER URIC ACID.  Marland Kitchen atorvastatin (LIPITOR) 20 MG tablet Take 20 mg by mouth daily.  . cholecalciferol (VITAMIN D) 1000 UNITS tablet Take 2,000 Units by mouth daily.  Marland Kitchen dextromethorphan-guaiFENesin (ROBAFEN DM CGH/CHEST CONGEST) 10-100 MG/5ML liquid Take 10 mLs by mouth every 6 (six) hours as needed for cough.  . Emollient (EUCERIN ORIGINAL HEALING) LOTN Apply 1 application topically 2 (two) times daily.  Marland Kitchen latanoprost (XALATAN) 0.005 % ophthalmic solution Place 1 drop into  the right eye at bedtime.  Marland Kitchen levothyroxine (SYNTHROID, LEVOTHROID) 50 MCG tablet TAKE 1 TABLET BY MOUTH DAILY FOR THYROID SUPPLEMENT  . LORazepam (ATIVAN) 0.5 MG tablet Take 0.25 mg by mouth 2 (two) times a week. With showers  . metoprolol succinate (TOPROL XL) 25 MG 24 hr tablet Take 1 tablet (25 mg total) by mouth daily.  Marland Kitchen NAMENDA XR 28 MG CP24 24 hr capsule Take 28 mg by mouth every evening.   . nitroGLYCERIN (NITROSTAT) 0.4 MG SL tablet Place 1 tablet (0.4 mg  total) under the tongue every 5 (five) minutes as needed for chest pain.  Marland Kitchen sertraline (ZOLOFT) 25 MG tablet Take 25 mg by mouth at bedtime.    No facility-administered encounter medications on file as of 11/28/2017.     Activities of Daily Living In your present state of health, do you have any difficulty performing the following activities: 11/28/2017 11/28/2016  Hearing? N Y  Vision? N Y  Difficulty concentrating or making decisions? Tempie Donning  Walking or climbing stairs? Y Y  Dressing or bathing? Y Y  Doing errands, shopping? Tempie Donning  Preparing Food and eating ? Y Y  Using the Toilet? N Y  In the past six months, have you accidently leaked urine? N Y  Do you have problems with loss of bowel control? N Y  Managing your Medications? Y Y  Managing your Finances? Tempie Donning  Housekeeping or managing your Housekeeping? Tempie Donning  Some recent data might be hidden    Patient Care Team: Gayland Curry, DO as PCP - General (Geriatric Medicine) Community, [facility], Denice Bors, MD as Consulting Physician (Cardiology) Jacolyn Reedy, MD as Consulting Physician (Cardiology) Tyler Pita, MD as Consulting Physician (Radiation Oncology) Allyn Kenner, MD as Consulting Physician (Dermatology) Ardis Hughs, MD as Attending Physician (Urology) Royal Hawthorn, NP as Nurse Practitioner (Nurse Practitioner) Bernell List, CPhT as [place] Management (Pharmacy Technician)   Assessment:   This is a  routine wellness examination for Damon Miller.  Exercise Activities and Dietary recommendations Current Exercise Habits: The patient does not participate in regular exercise at present, Exercise limited by: neurologic condition(s);orthopedic condition(s)  Goals    None      Fall Risk Fall Risk  11/28/2017 12/27/2016 11/28/2016 08/18/2015 04/21/2015  Falls in the past year? No No No No No  Number falls in past yr: - - - - -  Injury with Fall? - - - - -  Comment - - - - -   Is the patient's home free of loose throw rugs in walkways, pet beds, electrical cords, etc?   yes      Grab bars in the bathroom? yes      Handrails on the stairs?   yes      Adequate lighting?   yes  Depression Screen PHQ 2/9 Scores 11/28/2017 12/27/2016 11/28/2016 04/21/2015  PHQ - 2 Score 0 0 0 0    Cognitive Function MMSE - Mini Mental State Exam 09/06/2017 03/02/2016 01/26/2015 06/17/2014 09/15/2013  Orientation to time 0 0 0 0 2  Orientation to Place 4 3 4 4 5   Registration 3 3 3 3 3   Attention/ Calculation 5 5 2 5 5   Recall 0 0 0 2 2  Language- name 2 objects 2 2 2 2 2   Language- repeat 1 1 1 1 1   Language- follow 3 step command 1 3 3 3 3   Language- read & follow direction 1 1 1 1 1   Write a sentence 1 1 1 1 1   Copy design 1 1 1 1 1   Total score 19 20 18 23 26         Immunization History  Administered Date(s) Administered  . Influenza Inj Mdck Quad Pf 03/02/2016  . Influenza Whole 02/13/2012  . Influenza-Unspecified 02/18/2014, 02/25/2015, 05/18/2017  . Pneumococcal Conjugate-13 10/06/2014  . Pneumococcal Polysaccharide-23 05/15/1998  . Tdap 11/28/2016  . Zoster 05/15/2006  . Zoster Recombinat (Shingrix) 04/10/2017, 08/07/2017    Qualifies for Shingles Vaccine?  Up to date, completed  Screening Tests Health Maintenance  Topic Date Due  . INFLUENZA VACCINE  12/13/2017  . TETANUS/TDAP  11/29/2026  . PNA vac Low Risk Adult  Completed   Cancer Screenings: Lung: Low Dose CT Chest recommended if Age  74-80 years, 30 pack-year currently smoking OR have quit w/in 15years. Patient does not qualify. Colorectal: up to date     Plan:    I have personally reviewed and addressed the Medicare Annual Wellness questionnaire and have noted the following in the patient's chart:  A. Medical and social history B. Use of alcohol, tobacco or illicit drugs  C. Current medications and supplements D. Functional ability and status E.  Nutritional status F.  Physical activity G. Advance directives H. List of other physicians I.  Hospitalizations, surgeries, and ER visits in previous 12 months J.  [place] to include hearing, vision, cognitive, depression L. Referrals and appointments - none  In addition, I have reviewed and discussed with patient certain preventive protocols, quality metrics, and best practice recommendations. A written personalized care plan for preventive services as well as general preventive health recommendations were provided to patient.  See attached scanned questionnaire for additional information.   Signed,   Tyson Dense, RN Nurse Health Advisor  Patient Concerns: None

## 2017-11-28 NOTE — Patient Outreach (Signed)
[facility]) Care Management  11/28/2017  Damon Miller 1920/05/11 [id number]   Medication Adherence call to [name] patient's telephone number is disconnected under [facility] and [place].Mr. Logan is showing past due under [place].  [place] Management Direct Dial [phone]  Fax [phone] [email or web address]

## 2018-01-28 ENCOUNTER — Non-Acute Institutional Stay: Payer: Medicare Other | Admitting: Adult Health

## 2018-01-28 DIAGNOSIS — I10 Essential (primary) hypertension: Secondary | ICD-10-CM | POA: Diagnosis not present

## 2018-01-28 DIAGNOSIS — I2510 Atherosclerotic heart disease of native coronary artery without angina pectoris: Secondary | ICD-10-CM

## 2018-01-28 DIAGNOSIS — M1A9XX0 Chronic gout, unspecified, without tophus (tophi): Secondary | ICD-10-CM

## 2018-01-28 DIAGNOSIS — I255 Ischemic cardiomyopathy: Secondary | ICD-10-CM

## 2018-01-28 DIAGNOSIS — F02818 Dementia in other diseases classified elsewhere, unspecified severity, with other behavioral disturbance: Secondary | ICD-10-CM

## 2018-01-28 DIAGNOSIS — G301 Alzheimer's disease with late onset: Secondary | ICD-10-CM | POA: Diagnosis not present

## 2018-01-28 DIAGNOSIS — F0281 Dementia in other diseases classified elsewhere with behavioral disturbance: Secondary | ICD-10-CM

## 2018-01-28 DIAGNOSIS — N183 Chronic kidney disease, stage 3 unspecified: Secondary | ICD-10-CM

## 2018-01-28 DIAGNOSIS — C674 Malignant neoplasm of posterior wall of bladder: Secondary | ICD-10-CM

## 2018-01-28 NOTE — Progress Notes (Signed)
Location:  Occupational psychologist of Service:  SNF (31) Provider:   Cindi Carbon, ANP [place] [phone]   Gayland Curry, DO  Patient Care Team: Gayland Curry, DO as PCP - General (Geriatric Medicine) Community, [facility], Denice Bors, MD as Consulting Physician (Cardiology) Jacolyn Reedy, MD as Consulting Physician (Cardiology) Tyler Pita, MD as Consulting Physician (Radiation Oncology) Allyn Kenner, MD as Consulting Physician (Dermatology) Ardis Hughs, MD as Attending Physician (Urology) Royal Hawthorn, NP as Nurse Practitioner (Nurse Practitioner)  Extended Emergency Contact Information Primary Emergency Contact: Batta,"Wat" Tyrone [place] of [place] Phone: [phone] Relation: Son Secondary Emergency Contact: Edsel Petrin "Bab"          [place], [place] of [place] Phone: [phone] Relation: Daughter  Code Status:  DNR Goals of care: Advanced Directive information Advanced Directives 11/28/2017  Does Patient Have a Medical Advance Directive? Yes  Type of Paramedic of [place];Living will;Out of facility DNR (pink MOST or yellow form)  Does patient want to make changes to medical advance directive? No - Patient declined  Copy of [place] in Chart? Yes  Pre-existing out of facility DNR order (yellow form or pink MOST form) Yellow form placed in chart (order not valid for inpatient use)     Chief Complaint  Patient presents with  . Medical Management of Chronic Issues    HPI:  Pt is a 84 y.o. male seen today for medical management of chronic diseases.    Ischemic cardiomyopathy: ejection fraction of 60-73%, grade 1 diastolic dysfunction, hyopkinetic posterolateral and apical myocardium, mild aortic regurg, and mild systolic CHF  CAD: hx of NSTEMI in 2014  AD: doing well in the memory care setting in the  sense that he is still ambulatory and pleasant with no concerns. He continues to scream during his shower time but when he is finished tells the caregivers "thank you".  Ativan prior to showers did not help. 09/06/17 MMSE 19/30 passed clock  Urolethelial carcinoma: no reports of pain or hematuria. Treated with TURBT in 2017  XTG:GYIRSWNIOE  Gout: no new events on allopurinol  Past Medical History:  Diagnosis Date  . Abnormality of gait   . Allergic rhinitis due to pollen   . Arthritis   . CAD (coronary artery disease)   . Cataracts, bilateral   . Chronic kidney disease, stage II (mild)   . Dementia   . Diverticulosis   . DNR (do not resuscitate)   . Edema    wears compression hose  . GERD (gastroesophageal reflux disease)   . Glaucoma    Right eye  . Gout, unspecified   . Hernia, hiatal   . Hyperglycemia 2014  . Hyperlipemia   . Hypertension   . Hypothyroid   . Prostate cancer ([place])   . Vitamin D deficiency disease    Past Surgical History:  Procedure Laterality Date  . COLONOSCOPY  07/1990, 10/1992, 12/1995 and 02/1999   Polyps removed at each procedure  . EYE SURGERY Bilateral 1999   Cataract removal  . SKIN CANCER EXCISION  2004   Ears  . TRANSURETHRAL RESECTION OF BLADDER TUMOR N/A 02/24/2016   Procedure: TRANSURETHRAL RESECTION OF BLADDER TUMOR (TURBT), BILATERAL RETROGRADE POLYGRAM;  Surgeon: Ardis Hughs, MD;  Location: [facility];  Service: Urology;  Laterality: N/A;    Allergies  Allergen Reactions  . Ibuprofen Rash    Outpatient Encounter Medications as of  01/28/2018  Medication Sig  . acetaminophen (TYLENOL) 325 MG tablet Take 650 mg by mouth 2 (two) times daily as needed.  Marland Kitchen allopurinol (ZYLOPRIM) 100 MG tablet TAKE 1 TABLET ONCE DAILY TO LOWER URIC ACID.  Marland Kitchen atorvastatin (LIPITOR) 20 MG tablet Take 20 mg by mouth daily.  . cholecalciferol (VITAMIN D) 1000 UNITS tablet Take 2,000 Units by mouth daily.  Marland Kitchen dextromethorphan-guaiFENesin (MUCINEX DM) 30-600  MG 12hr tablet Take 2 tablets by mouth 2 (two) times daily as needed for cough.  . Emollient (EUCERIN ORIGINAL HEALING) LOTN Apply 1 application topically 2 (two) times daily.  Marland Kitchen latanoprost (XALATAN) 0.005 % ophthalmic solution Place 1 drop into the right eye at bedtime.  Marland Kitchen levothyroxine (SYNTHROID, LEVOTHROID) 50 MCG tablet TAKE 1 TABLET BY MOUTH DAILY FOR THYROID SUPPLEMENT  . metoprolol succinate (TOPROL XL) 25 MG 24 hr tablet Take 1 tablet (25 mg total) by mouth daily.  Marland Kitchen NAMENDA XR 28 MG CP24 24 hr capsule Take 28 mg by mouth every evening.   . nitroGLYCERIN (NITROSTAT) 0.4 MG SL tablet Place 1 tablet (0.4 mg total) under the tongue every 5 (five) minutes as needed for chest pain.  Marland Kitchen sertraline (ZOLOFT) 25 MG tablet Take 25 mg by mouth at bedtime.   . [DISCONTINUED] dextromethorphan-guaiFENesin (ROBAFEN DM CGH/CHEST CONGEST) 10-100 MG/5ML liquid Take 10 mLs by mouth every 6 (six) hours as needed for cough.  . [DISCONTINUED] LORazepam (ATIVAN) 0.5 MG tablet Take 0.25 mg by mouth 2 (two) times a week. With showers   No facility-administered encounter medications on file as of 01/28/2018.     Review of Systems  Constitutional: Negative for activity change, appetite change, chills, diaphoresis, fatigue, fever and unexpected weight change.  Respiratory: Negative for cough, shortness of breath, wheezing and stridor.   Cardiovascular: Negative for chest pain, palpitations and leg swelling.  Gastrointestinal: Negative for abdominal distention, abdominal pain, constipation and diarrhea.  Genitourinary: Negative for difficulty urinating and dysuria.  Musculoskeletal: Positive for gait problem. Negative for arthralgias, back pain, joint swelling and myalgias.  Neurological: Negative for dizziness, seizures, syncope, facial asymmetry, speech difficulty, weakness and headaches.  Hematological: Negative for adenopathy. Does not bruise/bleed easily.  Psychiatric/Behavioral: Positive for agitation  (during showers) and confusion. Negative for behavioral problems.    Immunization History  Administered Date(s) Administered  . Influenza Inj Mdck Quad Pf 03/02/2016  . Influenza Whole 02/13/2012  . Influenza-Unspecified 02/18/2014, 02/25/2015, 05/18/2017  . Pneumococcal Conjugate-13 10/06/2014  . Pneumococcal Polysaccharide-23 05/15/1998  . Tdap 11/28/2016  . Zoster 05/15/2006  . Zoster Recombinat (Shingrix) 04/10/2017, 08/07/2017   Pertinent  Health Maintenance Due  Topic Date Due  . INFLUENZA VACCINE  12/13/2017  . PNA vac Low Risk Adult  Completed   Fall Risk  11/28/2017 12/27/2016 11/28/2016 08/18/2015 04/21/2015  Falls in the past year? No No No No No  Number falls in past yr: - - - - -  Injury with Fall? - - - - -  Comment - - - - -   Functional Status Survey:    Vitals:   01/28/18 1509  Weight: 189 lb 3.2 oz (85.8 kg)   Body mass index is 27.94 kg/m. Physical Exam  Constitutional: He is oriented to person, place, and time. No distress.  HENT:  Head: Normocephalic and atraumatic.  Right Ear: External ear normal.  Left Ear: External ear normal.  Nose: Nose normal.  Mouth/Throat: Oropharynx is clear and moist. No oropharyngeal exudate.  Neck: No JVD present. No tracheal deviation present.  Cardiovascular: Normal rate and regular rhythm.  No murmur heard. Pulmonary/Chest: Effort normal and breath sounds normal. No respiratory distress. He has no wheezes.  Abdominal: Soft. Bowel sounds are normal. He exhibits no distension. There is no tenderness.  Musculoskeletal: He exhibits no edema, tenderness or deformity.  Lymphadenopathy:    He has no cervical adenopathy.  Neurological: He is alert and oriented to person, place, and time. No cranial nerve deficit.  Skin: Skin is warm and dry. He is not diaphoretic.  Psychiatric: He has a normal mood and affect.  Nursing note and vitals reviewed.   Labs reviewed: Recent Labs    09/14/17  NA 139  K 4.4  BUN 25*    CREATININE 1.4*   Recent Labs    09/14/17  AST 17  ALT 12  ALKPHOS 89   Recent Labs    09/14/17  WBC 6.9  HGB 13.1*  HCT 39*  PLT 224   Lab Results  Component Value Date   TSH 1.21 04/25/2017   Lab Results  Component Value Date   HGBA1C 6.1 02/14/2016   Lab Results  Component Value Date   CHOL 164 01/02/2017   HDL 53 01/02/2017   LDLCALC 80 01/02/2017   TRIG 158 01/02/2017   CHOLHDL 4.1 06/01/2012    Significant Diagnostic Results in last 30 days:  No results found.  Assessment/Plan  1. Coronary artery disease involving native coronary artery of native heart without angina pectoris Continue statin therapy for now, consider discontinuing if he declines   2. Cardiomyopathy, ischemic Compensated Continue BB therapy  3. Essential hypertension Controlled, continue Toprol 25 mg XL  4. Late onset Alzheimer's disease with behavioral disturbance Continue namenda  5. CKD (chronic kidney disease) stage 3, GFR 30-59 ml/min (HCC) Stable Continue to periodically monitor BMP and avoid nephrotoxic agents  6. Malignant neoplasm of posterior wall of urinary bladder (HCC) Not currently receiving treatment due to his age and dementia Not symptomatic  7. 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

## 2018-01-30 ENCOUNTER — Encounter: Payer: Self-pay | Admitting: Adult Health

## 2018-02-26 DIAGNOSIS — D649 Anemia, unspecified: Secondary | ICD-10-CM | POA: Diagnosis not present

## 2018-02-26 DIAGNOSIS — E038 Other specified hypothyroidism: Secondary | ICD-10-CM | POA: Diagnosis not present

## 2018-02-26 DIAGNOSIS — E039 Hypothyroidism, unspecified: Secondary | ICD-10-CM | POA: Diagnosis not present

## 2018-03-21 ENCOUNTER — Non-Acute Institutional Stay: Payer: Medicare Other | Admitting: Adult Health

## 2018-03-21 DIAGNOSIS — G301 Alzheimer's disease with late onset: Secondary | ICD-10-CM

## 2018-03-21 DIAGNOSIS — N183 Chronic kidney disease, stage 3 unspecified: Secondary | ICD-10-CM

## 2018-03-21 DIAGNOSIS — F329 Major depressive disorder, single episode, unspecified: Secondary | ICD-10-CM | POA: Diagnosis not present

## 2018-03-21 DIAGNOSIS — F0280 Dementia in other diseases classified elsewhere without behavioral disturbance: Secondary | ICD-10-CM

## 2018-03-21 DIAGNOSIS — C674 Malignant neoplasm of posterior wall of bladder: Secondary | ICD-10-CM | POA: Diagnosis not present

## 2018-03-21 DIAGNOSIS — F02818 Dementia in other diseases classified elsewhere, unspecified severity, with other behavioral disturbance: Secondary | ICD-10-CM

## 2018-03-21 DIAGNOSIS — F0393 Unspecified dementia, unspecified severity, with mood disturbance: Secondary | ICD-10-CM

## 2018-03-21 DIAGNOSIS — F0281 Dementia in other diseases classified elsewhere with behavioral disturbance: Secondary | ICD-10-CM

## 2018-03-21 DIAGNOSIS — E039 Hypothyroidism, unspecified: Secondary | ICD-10-CM | POA: Diagnosis not present

## 2018-03-21 NOTE — Progress Notes (Signed)
Location:  Occupational psychologist of Service:  ALF (13) Provider:   Cindi Carbon, ANP [place] [phone]   Gayland Curry, DO  Patient Care Team: Gayland Curry, DO as PCP - General (Geriatric Medicine) Community[facility], Denice Bors, MD as Consulting Physician (Cardiology) Jacolyn Reedy, MD as Consulting Physician (Cardiology) Tyler Pita, MD as Consulting Physician (Radiation Oncology) Allyn Kenner, MD as Consulting Physician (Dermatology) Ardis Hughs, MD as Attending Physician (Urology) Royal Hawthorn, NP as Nurse Practitioner (Nurse Practitioner)  Extended Emergency Contact Information Primary Emergency Contact: Arlington,"Wat" Tyrone Apple [place] of [place] Phone: [phone] Relation: Son Secondary Emergency Contact: Edsel Petrin "Bab"          [place], [place] Phone: [phone] Relation: Daughter  Code Status:  DNR Goals of care: Advanced Directive information Advanced Directives 11/28/2017  Does Patient Have a Medical Advance Directive? Yes  Type of Paramedic of [place];Living will;Out of facility DNR (pink MOST or yellow form)  Does patient want to make changes to medical advance directive? No - Patient declined  Copy of [place] in Chart? Yes  Pre-existing out of facility DNR order (yellow form or pink MOST form) Yellow form placed in chart (order not valid for inpatient use)     Chief Complaint  Patient presents with  . Medical Management of Chronic Issues    HPI:  Pt is a 84 y.o. male seen today for medical management of chronic diseases.  He resides in the memory care unit due to advanced dementia.  Remains ambulatory but spends much of his time in his room.  He has lost 13 lbs since July.  No reported GI issues but he does tend to skip breakfast. Wt Readings from Last 3 Encounters:  03/28/18 186  lb 9.6 oz (84.6 kg)  01/28/18 189 lb 3.2 oz (85.8 kg)  11/28/17 199 lb (90.3 kg)   Hx of malignant neoplasm of the bladder with hx of TURBT in 2017.  No reported bladder pain or hematuria. Not going out to see urology due to his advanced dementia.   In mid Oct her had issues with aggression towards staff, increased confusion, and resistance to care. Zoloft was increased and xanax started with showers which seems to have helped.  PRN xanax was not used in the previous 14 day hx.   Past Medical History:  Diagnosis Date  . Abnormality of gait   . Allergic rhinitis due to pollen   . Arthritis   . CAD (coronary artery disease)   . Cataracts, bilateral   . Chronic kidney disease, stage II (mild)   . Dementia ([place])   . Diverticulosis   . DNR (do not resuscitate)   . Edema    wears compression hose  . GERD (gastroesophageal reflux disease)   . Glaucoma    Right eye  . Gout, unspecified   . Hernia, hiatal   . Hyperglycemia 2014  . Hyperlipemia   . Hypertension   . Hypothyroid   . Myocardial infarction ([place])   . Prostate cancer ([place])   . Vitamin D deficiency disease    Past Surgical History:  Procedure Laterality Date  . COLONOSCOPY  07/1990, 10/1992, 12/1995 and 02/1999   Polyps removed at each procedure  . EYE SURGERY Bilateral 1999   Cataract removal  . SKIN CANCER EXCISION  2004   Ears  . TRANSURETHRAL RESECTION OF BLADDER TUMOR N/A  02/24/2016   Procedure: TRANSURETHRAL RESECTION OF BLADDER TUMOR (TURBT), BILATERAL RETROGRADE POLYGRAM;  Surgeon: Ardis Hughs, MD;  Location: [facility];  Service: Urology;  Laterality: N/A;    Allergies  Allergen Reactions  . Ibuprofen Rash    Outpatient Encounter Medications as of 03/21/2018  Medication Sig  . acetaminophen (TYLENOL) 325 MG tablet Take 650 mg by mouth 2 (two) times daily.   Marland Kitchen allopurinol (ZYLOPRIM) 100 MG tablet TAKE 1 TABLET ONCE DAILY TO LOWER URIC ACID.  Marland Kitchen ALPRAZolam (XANAX) 0.5 MG tablet Take 0.5 mg by mouth 2 (two)  times daily as needed for anxiety.  Marland Kitchen atorvastatin (LIPITOR) 20 MG tablet Take 20 mg by mouth daily.  . cholecalciferol (VITAMIN D) 1000 UNITS tablet Take 2,000 Units by mouth daily.  . Emollient (EUCERIN ORIGINAL HEALING) LOTN Apply 1 application topically 2 (two) times daily.  Marland Kitchen latanoprost (XALATAN) 0.005 % ophthalmic solution Place 1 drop into the right eye at bedtime.  Marland Kitchen levothyroxine (SYNTHROID, LEVOTHROID) 50 MCG tablet TAKE 1 TABLET BY MOUTH DAILY FOR THYROID SUPPLEMENT  . metoprolol succinate (TOPROL XL) 25 MG 24 hr tablet Take 1 tablet (25 mg total) by mouth daily.  Marland Kitchen NAMENDA XR 28 MG CP24 24 hr capsule Take 28 mg by mouth every evening.   . nitroGLYCERIN (NITROSTAT) 0.4 MG SL tablet Place 1 tablet (0.4 mg total) under the tongue every 5 (five) minutes as needed for chest pain.  Marland Kitchen sertraline (ZOLOFT) 25 MG tablet Take 75 mg by mouth at bedtime.   . [DISCONTINUED] dextromethorphan-guaiFENesin (MUCINEX DM) 30-600 MG 12hr tablet Take 2 tablets by mouth 2 (two) times daily as needed for cough.   No facility-administered encounter medications on file as of 03/21/2018.     Review of Systems  Unable to perform ROS: Dementia    Immunization History  Administered Date(s) Administered  . Influenza Inj Mdck Quad Pf 03/02/2016  . Influenza Whole 02/13/2012  . Influenza,inj,Quad PF,6+ Mos 03/05/2018  . Influenza-Unspecified 02/18/2014, 02/25/2015, 05/18/2017  . Pneumococcal Conjugate-13 10/06/2014  . Pneumococcal Polysaccharide-23 05/15/1998  . Tdap 11/28/2016  . Zoster 05/15/2006  . Zoster Recombinat (Shingrix) 04/10/2017, 08/07/2017   Pertinent  Health Maintenance Due  Topic Date Due  . INFLUENZA VACCINE  Completed  . PNA vac Low Risk Adult  Completed   Fall Risk  11/28/2017 12/27/2016 11/28/2016 08/18/2015 04/21/2015  Falls in the past year? No No No No No  Number falls in past yr: - - - - -  Injury with Fall? - - - - -  Comment - - - - -   Functional Status Survey:    Vitals:     03/28/18 1557  Weight: 186 lb 9.6 oz (84.6 kg)   Body mass index is 27.56 kg/m.  Wt Readings from Last 3 Encounters:  03/28/18 186 lb 9.6 oz (84.6 kg)  01/28/18 189 lb 3.2 oz (85.8 kg)  11/28/17 199 lb (90.3 kg)   Physical Exam  Constitutional: No distress.  HENT:  Head: Normocephalic and atraumatic.  Mouth/Throat: No oropharyngeal exudate.  Neck: No JVD present. No tracheal deviation present. No thyromegaly present.  Cardiovascular: Normal rate and regular rhythm.  No murmur heard. Pulmonary/Chest: Effort normal and breath sounds normal. No respiratory distress. He has no wheezes.  Abdominal: Soft. Bowel sounds are normal. He exhibits no distension. There is no tenderness.  Lymphadenopathy:    He has no cervical adenopathy.  Neurological: He is alert.  Oriented to self only  Skin: Skin is warm and dry.  He is not diaphoretic.  Psychiatric: He has a normal mood and affect.    Labs reviewed: Recent Labs    09/14/17  NA 139  K 4.4  BUN 25*  CREATININE 1.4*   Recent Labs    09/14/17  AST 17  ALT 12  ALKPHOS 89   Recent Labs    09/14/17  WBC 6.9  HGB 13.1*  HCT 39*  PLT 224   Lab Results  Component Value Date   TSH 1.21 04/25/2017   Lab Results  Component Value Date   HGBA1C 6.1 02/14/2016   Lab Results  Component Value Date   CHOL 164 01/02/2017   HDL 53 01/02/2017   LDLCALC 80 01/02/2017   TRIG 158 01/02/2017   CHOLHDL 4.1 06/01/2012    Significant Diagnostic Results in last 30 days:  No results found.  Assessment/Plan  1. Late onset Alzheimer's disease with behavioral disturbance ([place]) MMSE 18/30 02/17/2018 Improved behaviors with increased zoloft and xanax for showers. Will likely discontinue prn xanax soon  2. Depression due to dementia [facility]) Improved mood, as above  3. Hypothyroidism, unspecified type Check TSH and address accordingly  4. Malignant neoplasm of posterior wall of urinary bladder (HCC) No further urinary symptoms  reported, avoid aggressive testing and treatment due to his age and progressive dementia  5. CKD (chronic kidney disease) stage 3, GFR 30-59 ml/min (HCC) Continue to periodically monitor BMP and avoid nephrotoxic agents  6. Weight loss ? If this is due to underlying malignancy, decreased intake with associated progressive dementia, or thyroid issue. Will check labs.  Family/ staff Communication: discussed with staff.  Labs/tests ordered:   BMP CBC TSH due to weight loss

## 2018-03-22 DIAGNOSIS — D508 Other iron deficiency anemias: Secondary | ICD-10-CM | POA: Diagnosis not present

## 2018-03-22 DIAGNOSIS — D649 Anemia, unspecified: Secondary | ICD-10-CM | POA: Diagnosis not present

## 2018-03-22 DIAGNOSIS — E039 Hypothyroidism, unspecified: Secondary | ICD-10-CM | POA: Diagnosis not present

## 2018-03-22 DIAGNOSIS — D631 Anemia in chronic kidney disease: Secondary | ICD-10-CM | POA: Diagnosis not present

## 2018-03-22 LAB — TSH: TSH: 1.46 (ref 0.41–5.90)

## 2018-03-22 LAB — CBC AND DIFFERENTIAL
HCT: 35 — AB (ref 41–53)
HEMOGLOBIN: 12.1 — AB (ref 13.5–17.5)
Platelets: 197 (ref 150–399)
WBC: 6.1

## 2018-03-22 LAB — BASIC METABOLIC PANEL
BUN: 32 — AB (ref 4–21)
Creatinine: 1.4 — AB (ref 0.6–1.3)
Glucose: 91
POTASSIUM: 4.3 (ref 3.4–5.3)
SODIUM: 139 (ref 137–147)

## 2018-03-28 ENCOUNTER — Encounter: Payer: Self-pay | Admitting: Adult Health

## 2018-04-17 DIAGNOSIS — R278 Other lack of coordination: Secondary | ICD-10-CM | POA: Diagnosis not present

## 2018-04-17 DIAGNOSIS — G301 Alzheimer's disease with late onset: Secondary | ICD-10-CM | POA: Diagnosis not present

## 2018-04-17 DIAGNOSIS — I255 Ischemic cardiomyopathy: Secondary | ICD-10-CM | POA: Diagnosis not present

## 2018-04-17 DIAGNOSIS — R2689 Other abnormalities of gait and mobility: Secondary | ICD-10-CM | POA: Diagnosis not present

## 2018-04-18 DIAGNOSIS — G301 Alzheimer's disease with late onset: Secondary | ICD-10-CM | POA: Diagnosis not present

## 2018-04-18 DIAGNOSIS — R2689 Other abnormalities of gait and mobility: Secondary | ICD-10-CM | POA: Diagnosis not present

## 2018-04-18 DIAGNOSIS — R278 Other lack of coordination: Secondary | ICD-10-CM | POA: Diagnosis not present

## 2018-04-18 DIAGNOSIS — I255 Ischemic cardiomyopathy: Secondary | ICD-10-CM | POA: Diagnosis not present

## 2018-04-19 DIAGNOSIS — R278 Other lack of coordination: Secondary | ICD-10-CM | POA: Diagnosis not present

## 2018-04-19 DIAGNOSIS — R2689 Other abnormalities of gait and mobility: Secondary | ICD-10-CM | POA: Diagnosis not present

## 2018-04-19 DIAGNOSIS — G301 Alzheimer's disease with late onset: Secondary | ICD-10-CM | POA: Diagnosis not present

## 2018-04-19 DIAGNOSIS — I255 Ischemic cardiomyopathy: Secondary | ICD-10-CM | POA: Diagnosis not present

## 2018-04-22 ENCOUNTER — Other Ambulatory Visit: Payer: Self-pay | Admitting: Adult Health

## 2018-04-22 DIAGNOSIS — I255 Ischemic cardiomyopathy: Secondary | ICD-10-CM | POA: Diagnosis not present

## 2018-04-22 DIAGNOSIS — R2689 Other abnormalities of gait and mobility: Secondary | ICD-10-CM | POA: Diagnosis not present

## 2018-04-22 DIAGNOSIS — G301 Alzheimer's disease with late onset: Secondary | ICD-10-CM | POA: Diagnosis not present

## 2018-04-22 DIAGNOSIS — R278 Other lack of coordination: Secondary | ICD-10-CM | POA: Diagnosis not present

## 2018-04-22 MED ORDER — ALPRAZOLAM 0.5 MG PO TABS: 0.5000 mg | ORAL_TABLET | ORAL | 2 refills | Status: DC

## 2018-04-22 MED ORDER — ALPRAZOLAM 0.5 MG PO TABS: 0.2500 mg | ORAL_TABLET | ORAL | 2 refills | Status: DC

## 2018-04-22 NOTE — Addendum Note (Signed)
Addended by: Barnie Mort on: 04/22/2018 02:00 PM   Modules accepted: Orders

## 2018-04-22 NOTE — Progress Notes (Signed)
Edited script

## 2018-04-23 DIAGNOSIS — R278 Other lack of coordination: Secondary | ICD-10-CM | POA: Diagnosis not present

## 2018-04-23 DIAGNOSIS — R2689 Other abnormalities of gait and mobility: Secondary | ICD-10-CM | POA: Diagnosis not present

## 2018-04-23 DIAGNOSIS — I255 Ischemic cardiomyopathy: Secondary | ICD-10-CM | POA: Diagnosis not present

## 2018-04-23 DIAGNOSIS — G301 Alzheimer's disease with late onset: Secondary | ICD-10-CM | POA: Diagnosis not present

## 2018-04-24 DIAGNOSIS — R278 Other lack of coordination: Secondary | ICD-10-CM | POA: Diagnosis not present

## 2018-04-24 DIAGNOSIS — I255 Ischemic cardiomyopathy: Secondary | ICD-10-CM | POA: Diagnosis not present

## 2018-04-24 DIAGNOSIS — R2689 Other abnormalities of gait and mobility: Secondary | ICD-10-CM | POA: Diagnosis not present

## 2018-04-24 DIAGNOSIS — G301 Alzheimer's disease with late onset: Secondary | ICD-10-CM | POA: Diagnosis not present

## 2018-04-25 DIAGNOSIS — R2689 Other abnormalities of gait and mobility: Secondary | ICD-10-CM | POA: Diagnosis not present

## 2018-04-25 DIAGNOSIS — G301 Alzheimer's disease with late onset: Secondary | ICD-10-CM | POA: Diagnosis not present

## 2018-04-25 DIAGNOSIS — R278 Other lack of coordination: Secondary | ICD-10-CM | POA: Diagnosis not present

## 2018-04-25 DIAGNOSIS — I255 Ischemic cardiomyopathy: Secondary | ICD-10-CM | POA: Diagnosis not present

## 2018-04-26 DIAGNOSIS — G301 Alzheimer's disease with late onset: Secondary | ICD-10-CM | POA: Diagnosis not present

## 2018-04-26 DIAGNOSIS — I255 Ischemic cardiomyopathy: Secondary | ICD-10-CM | POA: Diagnosis not present

## 2018-04-26 DIAGNOSIS — R2689 Other abnormalities of gait and mobility: Secondary | ICD-10-CM | POA: Diagnosis not present

## 2018-04-26 DIAGNOSIS — R278 Other lack of coordination: Secondary | ICD-10-CM | POA: Diagnosis not present

## 2018-05-01 DIAGNOSIS — R2689 Other abnormalities of gait and mobility: Secondary | ICD-10-CM | POA: Diagnosis not present

## 2018-05-01 DIAGNOSIS — R278 Other lack of coordination: Secondary | ICD-10-CM | POA: Diagnosis not present

## 2018-05-01 DIAGNOSIS — I255 Ischemic cardiomyopathy: Secondary | ICD-10-CM | POA: Diagnosis not present

## 2018-05-01 DIAGNOSIS — G301 Alzheimer's disease with late onset: Secondary | ICD-10-CM | POA: Diagnosis not present

## 2018-05-20 ENCOUNTER — Non-Acute Institutional Stay: Payer: Medicare Other | Admitting: Adult Health

## 2018-05-20 DIAGNOSIS — I255 Ischemic cardiomyopathy: Secondary | ICD-10-CM | POA: Diagnosis not present

## 2018-05-20 DIAGNOSIS — G301 Alzheimer's disease with late onset: Secondary | ICD-10-CM

## 2018-05-20 DIAGNOSIS — C674 Malignant neoplasm of posterior wall of bladder: Secondary | ICD-10-CM | POA: Diagnosis not present

## 2018-05-20 DIAGNOSIS — I2510 Atherosclerotic heart disease of native coronary artery without angina pectoris: Secondary | ICD-10-CM | POA: Diagnosis not present

## 2018-05-20 DIAGNOSIS — F0281 Dementia in other diseases classified elsewhere with behavioral disturbance: Secondary | ICD-10-CM

## 2018-05-20 DIAGNOSIS — H401190 Primary open-angle glaucoma, unspecified eye, stage unspecified: Secondary | ICD-10-CM

## 2018-05-20 DIAGNOSIS — M1A9XX0 Chronic gout, unspecified, without tophus (tophi): Secondary | ICD-10-CM

## 2018-05-20 DIAGNOSIS — N183 Chronic kidney disease, stage 3 unspecified: Secondary | ICD-10-CM

## 2018-05-20 DIAGNOSIS — R634 Abnormal weight loss: Secondary | ICD-10-CM

## 2018-05-25 ENCOUNTER — Encounter: Payer: Self-pay | Admitting: Adult Health

## 2018-05-25 NOTE — Progress Notes (Signed)
Location:  Occupational psychologist of Service:  ALF (13) Provider:   Cindi Carbon, ANP [place] [phone]   Gayland Curry, DO  Patient Care Team: Gayland Curry, DO as PCP - General (Geriatric Medicine) [facility], Denice Bors, MD as Consulting Physician (Cardiology) Jacolyn Reedy, MD as Consulting Physician (Cardiology) Tyler Pita, MD as Consulting Physician (Radiation Oncology) Allyn Kenner, MD as Consulting Physician (Dermatology) Ardis Hughs, MD as Attending Physician (Urology) Royal Hawthorn, NP as Nurse Practitioner (Nurse Practitioner)  Extended Emergency Contact Information Primary Emergency Contact: [place],"Wat" Tyrone [place] of [place] Phone: [phone] Relation: Son Secondary Emergency Contact: Edsel Petrin "Bab"          [place], [place] of [place] Phone: [phone] Relation: Daughter  Code Status:  DNR Goals of care: Advanced Directive information Advanced Directives 11/28/2017  Does Patient Have a Medical Advance Directive? Yes  Type of Paramedic of [place];Living will;Out of facility DNR (pink MOST or yellow form)  Does patient want to make changes to medical advance directive? No - Patient declined  Copy of [place] in Chart? Yes  Pre-existing out of facility DNR order (yellow form or pink MOST form) Yellow form placed in chart (order not valid for inpatient use)     Chief Complaint  Patient presents with  . Medical Management of Chronic Issues    HPI:  Pt is a 85 y.o. male seen today for medical management of chronic diseases.  He resides in the memory care unit due to advanced dementia.  Remains ambulatory but spends much of his time in his room.  He continues to lose weight and skips meals.  Wt Readings from Last 3 Encounters:  05/25/18 181 lb (82.1 kg)  03/28/18 186 lb 9.6 oz (84.6  kg)  01/28/18 189 lb 3.2 oz (85.8 kg)   Hx of malignant neoplasm of the bladder with hx of TURBT in 2017. In 2018 urology notes indicate a 2-3 cm lesion in the bladder and other sattelite lesions in the bladder and ureter noted.  No reported bladder pain or hematuria for this visit. Not going out to see urology due to his advanced dementia.   AD: MMSE 18/30 on 02/17/18 He continues to be resistant to personal care and occasionally will hit the staff. He yells during showers which is not new and has not responded to pharmacologic and non pharm measures.   Cardiomyopathy with Ef 40-45%: No sob, doe, pnd, edema, cp. Weight trending down.   Functional status: ambulatory and intermittently continent  Past Medical History:  Diagnosis Date  . Abnormality of gait   . Allergic rhinitis due to pollen   . Arthritis   . CAD (coronary artery disease)   . Cataracts, bilateral   . Chronic kidney disease, stage II (mild)   . Dementia ([place])   . Diverticulosis   . DNR (do not resuscitate)   . Edema    wears compression hose  . GERD (gastroesophageal reflux disease)   . Glaucoma    Right eye  . Gout, unspecified   . Hernia, hiatal   . Hyperglycemia 2014  . Hyperlipemia   . Hypertension   . Hypothyroid   . Myocardial infarction ([place])   . Prostate cancer ([place])   . Vitamin D deficiency disease    Past Surgical History:  Procedure Laterality Date  . COLONOSCOPY  07/1990, 10/1992, 12/1995 and 02/1999  Polyps removed at each procedure  . EYE SURGERY Bilateral 1999   Cataract removal  . SKIN CANCER EXCISION  2004   Ears  . TRANSURETHRAL RESECTION OF BLADDER TUMOR N/A 02/24/2016   Procedure: TRANSURETHRAL RESECTION OF BLADDER TUMOR (TURBT), BILATERAL RETROGRADE POLYGRAM;  Surgeon: Ardis Hughs, MD;  Location: [facility];  Service: Urology;  Laterality: N/A;    Allergies  Allergen Reactions  . Ibuprofen Rash    Outpatient Encounter Medications as of 05/20/2018  Medication Sig  .  acetaminophen (TYLENOL) 325 MG tablet Take 650 mg by mouth 2 (two) times daily.   Marland Kitchen allopurinol (ZYLOPRIM) 100 MG tablet TAKE 1 TABLET ONCE DAILY TO LOWER URIC ACID.  Marland Kitchen ALPRAZolam (XANAX) 0.5 MG tablet Take 0.5 tablets (0.25 mg total) by mouth 2 (two) times a week. On Wednesday and Saturday prior to showers.  Marland Kitchen atorvastatin (LIPITOR) 20 MG tablet Take 20 mg by mouth daily.  . cholecalciferol (VITAMIN D) 1000 UNITS tablet Take 2,000 Units by mouth daily.  . Emollient (EUCERIN ORIGINAL HEALING) LOTN Apply 1 application topically 2 (two) times daily.  Marland Kitchen latanoprost (XALATAN) 0.005 % ophthalmic solution Place 1 drop into the right eye at bedtime.  Marland Kitchen levothyroxine (SYNTHROID, LEVOTHROID) 50 MCG tablet TAKE 1 TABLET BY MOUTH DAILY FOR THYROID SUPPLEMENT  . metoprolol succinate (TOPROL XL) 25 MG 24 hr tablet Take 1 tablet (25 mg total) by mouth daily.  Marland Kitchen NAMENDA XR 28 MG CP24 24 hr capsule Take 28 mg by mouth every evening.   . nitroGLYCERIN (NITROSTAT) 0.4 MG SL tablet Place 1 tablet (0.4 mg total) under the tongue every 5 (five) minutes as needed for chest pain.  Marland Kitchen sertraline (ZOLOFT) 25 MG tablet Take 75 mg by mouth at bedtime.    No facility-administered encounter medications on file as of 05/20/2018.     Review of Systems  Unable to perform ROS: Dementia    Immunization History  Administered Date(s) Administered  . Influenza Inj Mdck Quad Pf 03/02/2016  . Influenza Whole 02/13/2012  . Influenza,inj,Quad PF,6+ Mos 03/05/2018  . Influenza-Unspecified 02/18/2014, 02/25/2015, 05/18/2017  . Pneumococcal Conjugate-13 10/06/2014  . Pneumococcal Polysaccharide-23 05/15/1998  . Tdap 11/28/2016  . Zoster 05/15/2006  . Zoster Recombinat (Shingrix) 04/10/2017, 08/07/2017   Pertinent  Health Maintenance Due  Topic Date Due  . INFLUENZA VACCINE  Completed  . PNA vac Low Risk Adult  Completed   Fall Risk  11/28/2017 12/27/2016 11/28/2016 08/18/2015 04/21/2015  Falls in the past year? No No No No No    Number falls in past yr: - - - - -  Injury with Fall? - - - - -  Comment - - - - -   Functional Status Survey:    Vitals:   05/25/18 0735  Weight: 181 lb (82.1 kg)   Body mass index is 26.73 kg/m.  Wt Readings from Last 3 Encounters:  05/25/18 181 lb (82.1 kg)  03/28/18 186 lb 9.6 oz (84.6 kg)  01/28/18 189 lb 3.2 oz (85.8 kg)   Physical Exam Constitutional:      General: He is not in acute distress.    Appearance: He is not diaphoretic.  HENT:     Head: Normocephalic and atraumatic.     Mouth/Throat:     Pharynx: No oropharyngeal exudate.  Neck:     Thyroid: No thyromegaly.     Vascular: No JVD.     Trachea: No tracheal deviation.  Cardiovascular:     Rate and Rhythm: Normal rate and  regular rhythm.     Heart sounds: No murmur.  Pulmonary:     Effort: Pulmonary effort is normal. No respiratory distress.     Breath sounds: Normal breath sounds. No wheezing.  Abdominal:     General: Bowel sounds are normal. There is no distension.     Palpations: Abdomen is soft.     Tenderness: There is no abdominal tenderness.  Lymphadenopathy:     Cervical: No cervical adenopathy.  Skin:    General: Skin is warm and dry.  Neurological:     Mental Status: He is alert.     Comments: Oriented to self only     Labs reviewed: Recent Labs    09/14/17 03/22/18  NA 139 139  K 4.4 4.3  BUN 25* 32*  CREATININE 1.4* 1.4*   Recent Labs    09/14/17  AST 17  ALT 12  ALKPHOS 89   Recent Labs    09/14/17 03/22/18  WBC 6.9 6.1  HGB 13.1* 12.1*  HCT 39* 35*  PLT 224 197   Lab Results  Component Value Date   TSH 1.46 03/22/2018   Lab Results  Component Value Date   HGBA1C 6.1 02/14/2016   Lab Results  Component Value Date   CHOL 164 01/02/2017   HDL 53 01/02/2017   LDLCALC 80 01/02/2017   TRIG 158 01/02/2017   CHOLHDL 4.1 06/01/2012    Significant Diagnostic Results in last 30 days:  No results found.  Assessment/Plan  1. Cardiomyopathy, ischemic Appears  compensated and asymptomatic   2. Coronary artery disease involving native coronary artery of native heart without angina pectoris No new issues. Not on antiplatelet therapy, hx of hematuria.  3. Late onset Alzheimer's disease with behavioral disturbance ([place]) Continues with periods of agitation but remains stable functionally.   4. CKD (chronic kidney disease) stage 3, GFR 30-59 ml/min (HCC) Continue to periodically monitor BMP and avoid nephrotoxic agents  5. Malignant neoplasm of posterior wall of urinary bladder (HCC) No new symptoms Progressive weight loss in the setting of progressive dementia and skipping meals in a 85 y.o. male. DNR status, avoid aggressive care.   6. Chronic gout without tophus, unspecified cause, unspecified site No new issues Continue allopurinol 100 mg qd  7. Primary open angle glaucoma, unspecified glaucoma stage, unspecified laterality Staff to check with his family and get a f/u apt with ophthalmology   8. Weight loss Skips meals and stays in his room TSH WNL ,BMP ok Continue supportive care.  Family/ staff Communication: discussed with staff.  Labs/tests ordered:   NA

## 2018-07-08 DIAGNOSIS — L84 Corns and callosities: Secondary | ICD-10-CM | POA: Diagnosis not present

## 2018-07-08 DIAGNOSIS — B351 Tinea unguium: Secondary | ICD-10-CM | POA: Diagnosis not present

## 2018-07-09 ENCOUNTER — Encounter: Payer: Self-pay | Admitting: Internal Medicine

## 2018-07-09 ENCOUNTER — Non-Acute Institutional Stay: Payer: Medicare Other | Admitting: Internal Medicine

## 2018-07-09 DIAGNOSIS — G301 Alzheimer's disease with late onset: Secondary | ICD-10-CM | POA: Diagnosis not present

## 2018-07-09 DIAGNOSIS — C674 Malignant neoplasm of posterior wall of bladder: Secondary | ICD-10-CM | POA: Diagnosis not present

## 2018-07-09 DIAGNOSIS — E039 Hypothyroidism, unspecified: Secondary | ICD-10-CM | POA: Diagnosis not present

## 2018-07-09 DIAGNOSIS — F0393 Unspecified dementia, unspecified severity, with mood disturbance: Secondary | ICD-10-CM

## 2018-07-09 DIAGNOSIS — R634 Abnormal weight loss: Secondary | ICD-10-CM

## 2018-07-09 DIAGNOSIS — F329 Major depressive disorder, single episode, unspecified: Secondary | ICD-10-CM

## 2018-07-09 DIAGNOSIS — F0281 Dementia in other diseases classified elsewhere with behavioral disturbance: Secondary | ICD-10-CM

## 2018-07-09 DIAGNOSIS — F0280 Dementia in other diseases classified elsewhere without behavioral disturbance: Secondary | ICD-10-CM

## 2018-07-09 DIAGNOSIS — N183 Chronic kidney disease, stage 3 unspecified: Secondary | ICD-10-CM

## 2018-07-09 DIAGNOSIS — F02818 Dementia in other diseases classified elsewhere, unspecified severity, with other behavioral disturbance: Secondary | ICD-10-CM

## 2018-07-09 DIAGNOSIS — F32A Depression, unspecified: Secondary | ICD-10-CM

## 2018-07-09 NOTE — Progress Notes (Signed)
Patient ID: Damon Miller, male   DOB: 20-Feb-1920, 85 y.o.   MRN: [id number]  Location:  [facility] Room Number: [place] of Service:  ALF (13)ALF Provider:  Anasophia Pecor L. Mariea Clonts, D.O., C.M.D.  Gayland Curry, DO  Patient Care Team: Gayland Curry, DO as PCP - General (Geriatric Medicine) Community, [facility], Denice Bors, MD as Consulting Physician (Cardiology) Jacolyn Reedy, MD as Consulting Physician (Cardiology) Tyler Pita, MD as Consulting Physician (Radiation Oncology) Allyn Kenner, MD as Consulting Physician (Dermatology) Ardis Hughs, MD as Attending Physician (Urology) Royal Hawthorn, NP as Nurse Practitioner (Nurse Practitioner)  Extended Emergency Contact Information Primary Emergency Contact: Wegmann,"Wat" Tyrone [place] of [place] Phone: [phone] Relation: Son Secondary Emergency Contact: Edsel Petrin "Bab"          [place], [place] of [place] Phone: [phone] Relation: Daughter  Code Status:  DNR Goals of care: Advanced Directive information Advanced Directives 07/09/2018  Does Patient Have a Medical Advance Directive? Yes  Type of Paramedic of [place];Out of facility DNR (pink MOST or yellow form)  Does patient want to make changes to medical advance directive? No - Patient declined  Copy of [place] in Chart? Yes - validated most recent copy scanned in chart (See row information)  Pre-existing out of facility DNR order (yellow form or pink MOST form) Yellow form placed in chart (order not valid for inpatient use)     Chief Complaint  Patient presents with  . Medical Management of Chronic Issues    Routine Visit    HPI:  Pt is a 85 y.o. male seen today for medical management of chronic diseases.    Student note reviewed.  Pt seen and examined by me, as well, and I agree with her a/p.     In summary, frail 85 yo male memory care resident with dementia, weight loss, declining cognitive status, bladder ca s/p TURBT cardiomyopathy.    Biggest concern has been weight loss due to skipped meals from lack of appetite.  He is given boost daily and his weight is stable over the past 6 wks.  He is encouraged to eat and offered things he likes.    No problems with hematuria or suprapubic pain.    Continues to get agitated with showers--gets xanax beforehand to decrease anxiety.    Past Medical History:  Diagnosis Date  . Abnormality of gait   . Allergic rhinitis due to pollen   . Arthritis   . CAD (coronary artery disease)   . Cataracts, bilateral   . Chronic kidney disease, stage II (mild)   . Dementia ([place])   . Diverticulosis   . DNR (do not resuscitate)   . Edema    wears compression hose  . GERD (gastroesophageal reflux disease)   . Glaucoma    Right eye  . Gout, unspecified   . Hernia, hiatal   . Hyperglycemia 2014  . Hyperlipemia   . Hypertension   . Hypothyroid   . Myocardial infarction ([place])   . Prostate cancer ([place])   . Vitamin D deficiency disease    Past Surgical History:  Procedure Laterality Date  . COLONOSCOPY  07/1990, 10/1992, 12/1995 and 02/1999   Polyps removed at each procedure  . EYE SURGERY Bilateral 1999   Cataract removal  . SKIN CANCER EXCISION  2004   Ears  . TRANSURETHRAL RESECTION OF BLADDER TUMOR N/A 02/24/2016  Procedure: TRANSURETHRAL RESECTION OF BLADDER TUMOR (TURBT), BILATERAL RETROGRADE POLYGRAM;  Surgeon: Ardis Hughs, MD;  Location: [facility];  Service: Urology;  Laterality: N/A;    Allergies  Allergen Reactions  . Ibuprofen Rash    Outpatient Encounter Medications as of 07/09/2018  Medication Sig  . acetaminophen (TYLENOL) 325 MG tablet Take 650 mg by mouth 2 (two) times daily.   Marland Kitchen allopurinol (ZYLOPRIM) 100 MG tablet TAKE 1 TABLET ONCE DAILY TO LOWER URIC ACID.  Marland Kitchen ALPRAZolam (XANAX) 0.5 MG tablet Take 0.5 tablets  (0.25 mg total) by mouth 2 (two) times a week. On Wednesday and Saturday prior to showers.  Marland Kitchen atorvastatin (LIPITOR) 20 MG tablet Take 20 mg by mouth daily.  . cholecalciferol (VITAMIN D) 1000 UNITS tablet Take 2,000 Units by mouth daily.  . Emollient (EUCERIN ORIGINAL HEALING) LOTN Apply 1 application topically 2 (two) times daily.  . feeding supplement (BOOST HIGH PROTEIN) LIQD Take 1 Container by mouth daily.  Marland Kitchen latanoprost (XALATAN) 0.005 % ophthalmic solution Place 1 drop into the right eye at bedtime.  Marland Kitchen levothyroxine (SYNTHROID, LEVOTHROID) 50 MCG tablet TAKE 1 TABLET BY MOUTH DAILY FOR THYROID SUPPLEMENT  . metoprolol succinate (TOPROL XL) 25 MG 24 hr tablet Take 1 tablet (25 mg total) by mouth daily.  Marland Kitchen NAMENDA XR 28 MG CP24 24 hr capsule Take 28 mg by mouth every evening.   . nitroGLYCERIN (NITROSTAT) 0.4 MG SL tablet Place 1 tablet (0.4 mg total) under the tongue every 5 (five) minutes as needed for chest pain.  Marland Kitchen sertraline (ZOLOFT) 25 MG tablet Take 75 mg by mouth at bedtime.    No facility-administered encounter medications on file as of 07/09/2018.     Review of Systems  Constitutional: Positive for appetite change, fatigue and unexpected weight change. Negative for activity change, chills and fever.       Wt stable last 6 wks  HENT: Positive for hearing loss. Negative for congestion.   Eyes: Negative for visual disturbance.       Glasses  Respiratory: Negative for cough and shortness of breath.   Cardiovascular: Negative for chest pain, palpitations and leg swelling.  Gastrointestinal: Negative for abdominal pain, blood in stool, constipation, diarrhea, nausea and vomiting.  Genitourinary: Positive for hematuria. Negative for dysuria.  Musculoskeletal: Positive for gait problem. Negative for arthralgias.  Skin: Negative for color change.  Neurological: Negative for dizziness and weakness.  Psychiatric/Behavioral: Positive for confusion. Negative for sleep disturbance.        Agitation and behavior with baths    Immunization History  Administered Date(s) Administered  . Influenza Inj Mdck Quad Pf 03/02/2016  . Influenza Whole 02/13/2012  . Influenza,inj,Quad PF,6+ Mos 03/05/2018  . Influenza-Unspecified 02/18/2014, 02/25/2015, 05/18/2017  . Pneumococcal Conjugate-13 10/06/2014  . Pneumococcal Polysaccharide-23 05/15/1998  . Tdap 11/28/2016  . Zoster 05/15/2006  . Zoster Recombinat (Shingrix) 04/10/2017, 08/07/2017   Pertinent  Health Maintenance Due  Topic Date Due  . INFLUENZA VACCINE  Completed  . PNA vac Low Risk Adult  Completed   Fall Risk  11/28/2017 12/27/2016 11/28/2016 08/18/2015 04/21/2015  Falls in the past year? No No No No No  Number falls in past yr: - - - - -  Injury with Fall? - - - - -  Comment - - - - -   Functional Status Survey:    Vitals:   07/09/18 1420  BP: (!) 120/57  Pulse: 74  Resp: 18  Temp: 98.7 F (37.1 C)  TempSrc: Oral  SpO2: 96%  Weight: 182 lb (82.6 kg)  Height: 5\' 9"  (1.753 m)   Body mass index is 26.88 kg/m. Physical Exam Vitals signs reviewed.  HENT:     Head: Normocephalic and atraumatic.  Cardiovascular:     Pulses: Normal pulses.     Heart sounds: Normal heart sounds.  Pulmonary:     Effort: Pulmonary effort is normal.     Breath sounds: Normal breath sounds.  Abdominal:     General: Bowel sounds are normal.     Palpations: Abdomen is soft.  Musculoskeletal: Normal range of motion.  Skin:    General: Skin is warm and dry.  Neurological:     General: No focal deficit present.     Mental Status: He is alert.     Cranial Nerves: No cranial nerve deficit.  Psychiatric:        Mood and Affect: Mood normal.     Labs reviewed: Recent Labs    09/14/17 03/22/18  NA 139 139  K 4.4 4.3  BUN 25* 32*  CREATININE 1.4* 1.4*   Recent Labs    09/14/17  AST 17  ALT 12  ALKPHOS 89   Recent Labs    09/14/17 03/22/18  WBC 6.9 6.1  HGB 13.1* 12.1*  HCT 39* 35*  PLT 224 197   Lab Results    Component Value Date   TSH 1.46 03/22/2018   Lab Results  Component Value Date   HGBA1C 6.1 02/14/2016   Lab Results  Component Value Date   CHOL 164 01/02/2017   HDL 53 01/02/2017   LDLCALC 80 01/02/2017   TRIG 158 01/02/2017   CHOLHDL 4.1 06/01/2012    Significant Diagnostic Results in last 30 days:  No results found.  Assessment/Plan 1. Late onset Alzheimer's disease with behavioral disturbance ([place]) -cont namenda XR though benefit is weaning at this time  2. CKD (chronic kidney disease) stage 3, GFR 30-59 ml/min (HCC) -Avoid nephrotoxic agents like nsaids, dose adjust renally excreted meds, hydrate.  3. Malignant neoplasm of posterior wall of urinary bladder (HCC) -no recent s/s of recurrence, cont to monitor, but would avoid further surgeries at this stage of his dementia and frailty  4. Hypothyroidism, unspecified type -cont levothyroxine - Lab Results  Component Value Date   TSH 1.46 03/22/2018   5. Depression due to dementia [facility]) -initially in AL, his behavior with showers improved with zoloft and he was less tearful, but that benefit declined after moving to memory care  6. Weight loss -stable over this month, but had been trending down; does not eat all meals, but being offered foods he likes and supplements  Family/ staff Communication: discussed with memory care nurse  Labs/tests ordered:    Jericho Alcorn L. Keanthony Poole, D.O. [facility] [address], [place] [address] Cell Phone (Mon-Fri 8am-5pm):  [phone] On Call:  [phone] & follow prompts after 5pm & weekends Office Phone:  [phone] Office Fax:  [phone]

## 2018-07-09 NOTE — Progress Notes (Signed)
Subjective:     Patient ID: Damon Miller, male   DOB: 08/04/1919, 85 y.o.   MRN: [id number]  HPI  Patient is a 85 y.o. male who presents for medical management of chronic conditions. Patient currently resides on a memory care unit at [facility] due to advanced dementia.   Weight loss: Patient will skips meals at times and has had a previous history of weight loss. Patient maintaining weight within the past month. Last recorded weights are as follows: 05/25/2018 181lbs (82.1kg), 06/15/2018 182.6lbs (83 kg).   Alzheimer's Disease: MMSE 18/30 02/17/2018. Patient resides in room for most of the day. Patient ambulatory with minimal assist with ADL. Patient can become resistant with activities involving personal care and often hit staff. Patient is not respondent to pharmacologic and nonpharmacologic measures.  Malignant neoplasm of the bladder: History of TURBT in 2017. Nursing staff denies bladder pain and hematuria. Patient does not see urology due to advanced dementia.     Cardiomyopathy: EF 40-45%. Nursing staff and patient denies SOB,DOE, PND, CP and edema  Functional: Patient ambulates using walker and is intermittently continent Review of Systems  Constitutional: Negative for activity change, chills, fatigue and unexpected weight change.  HENT: Negative for congestion, dental problem, mouth sores and trouble swallowing.   Respiratory: Negative for cough, shortness of breath and wheezing.   Cardiovascular: Negative for chest pain, palpitations and leg swelling.  Gastrointestinal: Negative for abdominal distention, abdominal pain, constipation, diarrhea and nausea.  Genitourinary: Negative for difficulty urinating, dysuria and hematuria.  Musculoskeletal: Negative for arthralgias, back pain and myalgias.  Skin: Negative for color change, pallor, rash and wound.  Neurological: Negative for syncope, light-headedness and headaches.  Psychiatric/Behavioral: Positive for agitation, behavioral  problems and confusion.        Objective:   Physical Exam Vitals signs and nursing note reviewed.  Constitutional:      General: He is not in acute distress.    Appearance: Normal appearance.  HENT:     Head: Normocephalic and atraumatic.     Mouth/Throat:     Mouth: Mucous membranes are dry.     Pharynx: No oropharyngeal exudate.  Cardiovascular:     Rate and Rhythm: Normal rate and regular rhythm.     Heart sounds: No murmur.     Comments: No JVD present Pulmonary:     Effort: Pulmonary effort is normal. No respiratory distress.     Breath sounds: Normal breath sounds. No wheezing.  Abdominal:     General: Bowel sounds are normal. There is no distension.     Palpations: Abdomen is soft.     Tenderness: There is no abdominal tenderness.  Skin:    General: Skin is warm and dry.     Capillary Refill: Capillary refill takes 2 to 3 seconds.     Findings: No bruising, lesion or rash.  Neurological:     Mental Status: He is alert.     Comments: Orientated to self only  Psychiatric:     Comments: Patient has normal mood and affect during exam        Assessment:   1. Alzheimers Disease with behavioral disturbance:  MMSE 18/30 02/17/2018. Agitation still associated with personal care, but remains stable functionally. Continue Xanax 0.5mg  on Wednesday and Saturday prior to showers. Continue Sertraline 75 mg at bedtime.   2.  Weight Loss: Patient still skipping meals at times. Patient has maintained weight within past month. Continue supportive care.   3. Coronary artery disease: No new  issues. Patient is not on antiplatelet therapy due to history of hematuria.   4. Malignant neoplasm of posterior wall of urinary bladder: No new symptoms present. Aggressive care not recommended.   5. Chronic gout: No new issues, continue Allopurinol 100 mg every day  6. Primary open angle glaucoma, unspecified stage: No future appointments  made with opthalmology, will have staff recheck with  family on follow up appointment.   7. Chronic Kidney Disease stage 3: Renal function last checked 03/22/2018, BUN 32, Creatitine 1.4, will continue to periodically check BMP and avoid nephrotoxic agents.      Plan:

## 2018-08-30 ENCOUNTER — Non-Acute Institutional Stay: Payer: Medicare Other | Admitting: Adult Health

## 2018-08-30 ENCOUNTER — Encounter: Payer: Self-pay | Admitting: Adult Health

## 2018-08-30 DIAGNOSIS — G301 Alzheimer's disease with late onset: Secondary | ICD-10-CM | POA: Diagnosis not present

## 2018-08-30 DIAGNOSIS — N183 Chronic kidney disease, stage 3 unspecified: Secondary | ICD-10-CM

## 2018-08-30 DIAGNOSIS — E7800 Pure hypercholesterolemia, unspecified: Secondary | ICD-10-CM | POA: Diagnosis not present

## 2018-08-30 DIAGNOSIS — F0281 Dementia in other diseases classified elsewhere with behavioral disturbance: Secondary | ICD-10-CM

## 2018-08-30 DIAGNOSIS — H401190 Primary open-angle glaucoma, unspecified eye, stage unspecified: Secondary | ICD-10-CM

## 2018-08-30 DIAGNOSIS — F02818 Dementia in other diseases classified elsewhere, unspecified severity, with other behavioral disturbance: Secondary | ICD-10-CM

## 2018-08-30 DIAGNOSIS — I255 Ischemic cardiomyopathy: Secondary | ICD-10-CM

## 2018-08-30 DIAGNOSIS — F0393 Unspecified dementia, unspecified severity, with mood disturbance: Secondary | ICD-10-CM

## 2018-08-30 DIAGNOSIS — I10 Essential (primary) hypertension: Secondary | ICD-10-CM

## 2018-08-30 DIAGNOSIS — F329 Major depressive disorder, single episode, unspecified: Secondary | ICD-10-CM

## 2018-08-30 DIAGNOSIS — F0280 Dementia in other diseases classified elsewhere without behavioral disturbance: Secondary | ICD-10-CM

## 2018-08-30 NOTE — Progress Notes (Signed)
Location:  Occupational psychologist of Service:  ALF (13) Provider:   Cindi Carbon, ANP [place] [phone]   Gayland Curry, DO  Patient Care Team: Gayland Curry, DO as PCP - General (Geriatric Medicine) Community, [facility], Denice Bors, MD as Consulting Physician (Cardiology) Jacolyn Reedy, MD as Consulting Physician (Cardiology) Tyler Pita, MD as Consulting Physician (Radiation Oncology) Allyn Kenner, MD as Consulting Physician (Dermatology) Ardis Hughs, MD as Attending Physician (Urology) Royal Hawthorn, NP as Nurse Practitioner (Nurse Practitioner)  Extended Emergency Contact Information Primary Emergency Contact: Potvin,"Wat" Tyrone [place] States of [place] Phone: [phone] Relation: Son Secondary Emergency Contact: Edsel Petrin "Bab"          [place], [place] of [place] Phone: [phone] Relation: Daughter  Code Status: DNR Goals of care: Advanced Directive information Advanced Directives 07/09/2018  Does Patient Have a Medical Advance Directive? Yes  Type of Paramedic of [place];Out of facility DNR (pink MOST or yellow form)  Does patient want to make changes to medical advance directive? No - Patient declined  Copy of [place] in Chart? Yes - validated most recent copy scanned in chart (See row information)  Pre-existing out of facility DNR order (yellow form or pink MOST form) Yellow form placed in chart (order not valid for inpatient use)     Chief Complaint  Patient presents with  . Medical Management of Chronic Issues    HPI:  Pt is a 85 y.o. male seen today for medical management of chronic diseases.    Resides in the memory care unit due to AD. He remains ambulatory but needs assistance with dressing and bathing. He continues to yell during personal care but is otherwise pleasant.   BP range 742-595  systolic.   Weight up 9 lbs in the past three months. Drinks boost once daily  CKD III:  Lab Results  Component Value Date   BUN 32 (A) 03/22/2018   Lab Results  Component Value Date   CREATININE 1.4 (A) 03/22/2018   HLD: on lipitor 20  Mg qd  Lab Results  Component Value Date   LDLCALC 80 01/02/2017    Functional status: ambulatory, intermittently continent   Past Medical History:  Diagnosis Date  . Abnormality of gait   . Allergic rhinitis due to pollen   . Arthritis   . CAD (coronary artery disease)   . Cataracts, bilateral   . Chronic kidney disease, stage II (mild)   . Dementia ([place])   . Diverticulosis   . DNR (do not resuscitate)   . Edema    wears compression hose  . GERD (gastroesophageal reflux disease)   . Glaucoma    Right eye  . Gout, unspecified   . Hernia, hiatal   . Hyperglycemia 2014  . Hyperlipemia   . Hypertension   . Hypothyroid   . Myocardial infarction ([place])   . Prostate cancer ([place])   . Vitamin D deficiency disease    Past Surgical History:  Procedure Laterality Date  . COLONOSCOPY  07/1990, 10/1992, 12/1995 and 02/1999   Polyps removed at each procedure  . EYE SURGERY Bilateral 1999   Cataract removal  . SKIN CANCER EXCISION  2004   Ears  . TRANSURETHRAL RESECTION OF BLADDER TUMOR N/A 02/24/2016   Procedure: TRANSURETHRAL RESECTION OF BLADDER TUMOR (TURBT), BILATERAL RETROGRADE POLYGRAM;  Surgeon: Ardis Hughs, MD;  Location: [facility];  Service: Urology;  Laterality: N/A;    Allergies  Allergen Reactions  . Ibuprofen Rash    Outpatient Encounter Medications as of 08/30/2018  Medication Sig  . acetaminophen (TYLENOL) 325 MG tablet Take 650 mg by mouth 2 (two) times daily as needed.   Marland Kitchen allopurinol (ZYLOPRIM) 100 MG tablet TAKE 1 TABLET ONCE DAILY TO LOWER URIC ACID.  Marland Kitchen ALPRAZolam (XANAX) 0.5 MG tablet Take 0.5 tablets (0.25 mg total) by mouth 2 (two) times a week. On Wednesday and Saturday prior to showers.  Marland Kitchen atorvastatin  (LIPITOR) 20 MG tablet Take 20 mg by mouth daily.  . cholecalciferol (VITAMIN D) 1000 UNITS tablet Take 2,000 Units by mouth daily.  . Emollient (EUCERIN ORIGINAL HEALING) LOTN Apply 1 application topically 2 (two) times daily.  . feeding supplement (BOOST HIGH PROTEIN) LIQD Take 1 Container by mouth daily.  Marland Kitchen latanoprost (XALATAN) 0.005 % ophthalmic solution Place 1 drop into the right eye at bedtime.  Marland Kitchen levothyroxine (SYNTHROID, LEVOTHROID) 50 MCG tablet TAKE 1 TABLET BY MOUTH DAILY FOR THYROID SUPPLEMENT  . metoprolol succinate (TOPROL XL) 25 MG 24 hr tablet Take 1 tablet (25 mg total) by mouth daily.  Marland Kitchen NAMENDA XR 28 MG CP24 24 hr capsule Take 28 mg by mouth every evening.   . nitroGLYCERIN (NITROSTAT) 0.4 MG SL tablet Place 1 tablet (0.4 mg total) under the tongue every 5 (five) minutes as needed for chest pain.  Marland Kitchen sertraline (ZOLOFT) 25 MG tablet Take 75 mg by mouth at bedtime.    No facility-administered encounter medications on file as of 08/30/2018.     Review of Systems  Constitutional: Positive for unexpected weight change. Negative for activity change, appetite change, chills, diaphoresis, fatigue and fever.  Respiratory: Negative for cough, shortness of breath, wheezing and stridor.   Cardiovascular: Negative for chest pain, palpitations and leg swelling.  Gastrointestinal: Negative for abdominal distention, abdominal pain, constipation and diarrhea.  Genitourinary: Negative for difficulty urinating and dysuria.  Musculoskeletal: Positive for gait problem. Negative for arthralgias, back pain, joint swelling and myalgias.  Neurological: Negative for dizziness, seizures, syncope, facial asymmetry, speech difficulty, weakness and headaches.  Hematological: Negative for adenopathy. Does not bruise/bleed easily.  Psychiatric/Behavioral: Positive for agitation, behavioral problems and confusion.    Immunization History  Administered Date(s) Administered  . Influenza Inj Mdck Quad Pf  03/02/2016  . Influenza Whole 02/13/2012  . Influenza,inj,Quad PF,6+ Mos 03/05/2018  . Influenza-Unspecified 02/18/2014, 02/25/2015, 05/18/2017  . Pneumococcal Conjugate-13 10/06/2014  . Pneumococcal Polysaccharide-23 05/15/1998  . Tdap 11/28/2016  . Zoster 05/15/2006  . Zoster Recombinat (Shingrix) 04/10/2017, 08/07/2017   Pertinent  Health Maintenance Due  Topic Date Due  . INFLUENZA VACCINE  12/14/2018  . PNA vac Low Risk Adult  Completed   Fall Risk  07/16/2018 11/28/2017 12/27/2016 11/28/2016 08/18/2015  Falls in the past year? 0 No No No No  Number falls in past yr: 0 - - - -  Injury with Fall? 0 - - - -  Comment - - - - -  Risk for fall due to : Impaired mobility;Medication side effect;Impaired balance/gait;Mental status change - - - -  Follow up Falls evaluation completed - - - -   Functional Status Survey:    Vitals:   08/30/18 1019  Weight: 190 lb 6.4 oz (86.4 kg)   Body mass index is 28.12 kg/m.  Wt Readings from Last 3 Encounters:  08/30/18 190 lb 6.4 oz (86.4 kg)  07/09/18 182 lb (82.6 kg)  05/25/18 181 lb (82.1 kg)  Physical Exam Vitals signs and nursing note reviewed.  Constitutional:      General: He is not in acute distress.    Appearance: He is not diaphoretic.  HENT:     Head: Normocephalic and atraumatic.     Right Ear: Tympanic membrane, ear canal and external ear normal.     Left Ear: Tympanic membrane, ear canal and external ear normal.     Nose: No congestion or rhinorrhea.     Mouth/Throat:     Mouth: Mucous membranes are moist.     Pharynx: Oropharynx is clear. No oropharyngeal exudate or posterior oropharyngeal erythema.  Eyes:     General:        Right eye: No discharge.        Left eye: No discharge.     Conjunctiva/sclera: Conjunctivae normal.     Pupils: Pupils are equal, round, and reactive to light.  Neck:     Musculoskeletal: No neck rigidity.     Thyroid: No thyromegaly.     Vascular: No JVD.     Trachea: No tracheal deviation.   Cardiovascular:     Rate and Rhythm: Normal rate and regular rhythm.     Heart sounds: No murmur.  Pulmonary:     Effort: Pulmonary effort is normal. No respiratory distress.     Breath sounds: Normal breath sounds. No wheezing.  Abdominal:     General: Bowel sounds are normal. There is no distension.     Palpations: Abdomen is soft.     Tenderness: There is no abdominal tenderness. There is no right CVA tenderness or left CVA tenderness.  Musculoskeletal: Normal range of motion.     Right lower leg: No edema.     Left lower leg: No edema.  Lymphadenopathy:     Cervical: No cervical adenopathy.  Skin:    General: Skin is warm and dry.  Neurological:     General: No focal deficit present.     Mental Status: He is alert. Mental status is at baseline.     Comments: Oriented to self only. MAE, able to f/c.   Psychiatric:        Mood and Affect: Mood normal.     Labs reviewed: Recent Labs    09/14/17 03/22/18  NA 139 139  K 4.4 4.3  BUN 25* 32*  CREATININE 1.4* 1.4*   Recent Labs    09/14/17  AST 17  ALT 12  ALKPHOS 89   Recent Labs    09/14/17 03/22/18  WBC 6.9 6.1  HGB 13.1* 12.1*  HCT 39* 35*  PLT 224 197   Lab Results  Component Value Date   TSH 1.46 03/22/2018   Lab Results  Component Value Date   HGBA1C 6.1 02/14/2016   Lab Results  Component Value Date   CHOL 164 01/02/2017   HDL 53 01/02/2017   LDLCALC 80 01/02/2017   TRIG 158 01/02/2017   CHOLHDL 4.1 06/01/2012    Significant Diagnostic Results in last 30 days:  No results found.  Assessment/Plan 1. Late onset Alzheimer's disease with behavioral disturbance ([place]) Continue supportive care in the memory care setting Continue Namenda 28 mg XR qd and xanax with showers.   2. CKD (chronic kidney disease) stage 3, GFR 30-59 ml/min (HCC) Continue to periodically monitor BMP and avoid nephrotoxic agents  3. Primary open angle glaucoma, unspecified glaucoma stage, unspecified laterality Due to  agitation with personal care he has not been to see ophthalmology in the past year.  No eye complaints reported at this time.   4. Pure hypercholesterolemia Lab Results  Component Value Date   LDLCALC 80 01/02/2017    5. Depression due to dementia ([place]) Continue zoloft at 75 mg qd, would not taper at this time due to behaviors during personal care. If weight continues to trend upward may reconsider.    6. Essential hypertension Controlled, continue Toprol Xl 25 mg qd Avoid aggressive management of BP due to age, fall risk     Family/ staff Communication: staff  Labs/tests ordered:  LDL when covid restrictions removed

## 2018-09-24 DIAGNOSIS — M79671 Pain in right foot: Secondary | ICD-10-CM | POA: Diagnosis not present

## 2018-09-24 DIAGNOSIS — B351 Tinea unguium: Secondary | ICD-10-CM | POA: Diagnosis not present

## 2018-09-24 DIAGNOSIS — L84 Corns and callosities: Secondary | ICD-10-CM | POA: Diagnosis not present

## 2018-09-24 DIAGNOSIS — M79672 Pain in left foot: Secondary | ICD-10-CM | POA: Diagnosis not present

## 2018-11-04 ENCOUNTER — Encounter: Payer: Self-pay | Admitting: Adult Health

## 2018-11-04 ENCOUNTER — Non-Acute Institutional Stay: Payer: Medicare Other | Admitting: Adult Health

## 2018-11-04 ENCOUNTER — Other Ambulatory Visit: Payer: Self-pay | Admitting: Adult Health

## 2018-11-04 DIAGNOSIS — N183 Chronic kidney disease, stage 3 unspecified: Secondary | ICD-10-CM

## 2018-11-04 DIAGNOSIS — M1A9XX0 Chronic gout, unspecified, without tophus (tophi): Secondary | ICD-10-CM

## 2018-11-04 DIAGNOSIS — I10 Essential (primary) hypertension: Secondary | ICD-10-CM

## 2018-11-04 DIAGNOSIS — I255 Ischemic cardiomyopathy: Secondary | ICD-10-CM

## 2018-11-04 DIAGNOSIS — F0281 Dementia in other diseases classified elsewhere with behavioral disturbance: Secondary | ICD-10-CM

## 2018-11-04 DIAGNOSIS — E039 Hypothyroidism, unspecified: Secondary | ICD-10-CM

## 2018-11-04 DIAGNOSIS — G301 Alzheimer's disease with late onset: Secondary | ICD-10-CM | POA: Diagnosis not present

## 2018-11-04 MED ORDER — ALPRAZOLAM 0.5 MG PO TABS: 0.2500 mg | ORAL_TABLET | ORAL | 2 refills | Status: DC

## 2018-11-04 NOTE — Progress Notes (Signed)
Location:  Occupational psychologist of Service:  ALF (13) Provider:   Cindi Carbon, ANP [place] [phone]   Gayland Curry, DO  Patient Care Team: Gayland Curry, DO as PCP - General (Geriatric Medicine) Community, [facility], Denice Bors, MD as Consulting Physician (Cardiology) Jacolyn Reedy, MD as Consulting Physician (Cardiology) Tyler Pita, MD as Consulting Physician (Radiation Oncology) Allyn Kenner, MD as Consulting Physician (Dermatology) Ardis Hughs, MD as Attending Physician (Urology) Royal Hawthorn, NP as Nurse Practitioner (Nurse Practitioner)  Extended Emergency Contact Information Primary Emergency Contact: Welge,"Wat" Tyrone [place] of [place] Phone: [phone] Relation: Son Secondary Emergency Contact: Edsel Petrin "Bab"          [place], [place] Phone: [phone] Relation: Daughter  Code Status:  DNR Goals of care: Advanced Directive information Advanced Directives 07/09/2018  Does Patient Have a Medical Advance Directive? Yes  Type of Paramedic of [place];Out of facility DNR (pink MOST or yellow form)  Does patient want to make changes to medical advance directive? No - Patient declined  Copy of [place] in Chart? Yes - validated most recent copy scanned in chart (See row information)  Pre-existing out of facility DNR order (yellow form or pink MOST form) Yellow form placed in chart (order not valid for inpatient use)     Chief Complaint  Patient presents with  . Medical Management of Chronic Issues    HPI:  Pt is a 85 y.o. male seen today for medical management of chronic diseases. ICM: no sob, cp, doe, weight gain Wt Readings from Last 3 Encounters:  11/04/18 184 lb 9.6 oz (83.7 kg)  08/30/18 190 lb 6.4 oz (86.4 kg)  07/09/18 182 lb (82.6 kg)   Gout: on allopurinol, no new issues  AD: continues to scream during showers but is otherwise pleasant. Remains ambulatory, toilets himself at times. He stays in his room much of the time but has a pleasant mood  BP controlled  Hx of bladder ca with hx of bladder tumor resection  No reports of hematuria or abd pain     Past Medical History:  Diagnosis Date  . Abnormality of gait   . Allergic rhinitis due to pollen   . Arthritis   . CAD (coronary artery disease)   . Cataracts, bilateral   . Chronic kidney disease, stage II (mild)   . Dementia ([place])   . Diverticulosis   . DNR (do not resuscitate)   . Edema    wears compression hose  . GERD (gastroesophageal reflux disease)   . Glaucoma    Right eye  . Gout, unspecified   . Hernia, hiatal   . Hyperglycemia 2014  . Hyperlipemia   . Hypertension   . Hypothyroid   . Myocardial infarction ([place])   . Prostate cancer ([place])   . Vitamin D deficiency disease    Past Surgical History:  Procedure Laterality Date  . COLONOSCOPY  07/1990, 10/1992, 12/1995 and 02/1999   Polyps removed at each procedure  . EYE SURGERY Bilateral 1999   Cataract removal  . SKIN CANCER EXCISION  2004   Ears  . TRANSURETHRAL RESECTION OF BLADDER TUMOR N/A 02/24/2016   Procedure: TRANSURETHRAL RESECTION OF BLADDER TUMOR (TURBT), BILATERAL RETROGRADE POLYGRAM;  Surgeon: Ardis Hughs, MD;  Location: [facility];  Service: Urology;  Laterality: N/A;    Allergies  Allergen Reactions  . Ibuprofen Rash  Outpatient Encounter Medications as of 11/04/2018  Medication Sig  . acetaminophen (TYLENOL) 325 MG tablet Take 650 mg by mouth 2 (two) times daily as needed.   Marland Kitchen allopurinol (ZYLOPRIM) 100 MG tablet TAKE 1 TABLET ONCE DAILY TO LOWER URIC ACID.  Marland Kitchen ALPRAZolam (XANAX) 0.5 MG tablet Take 0.5 tablets (0.25 mg total) by mouth 2 (two) times a week. On Wednesday and Saturday prior to showers.  Marland Kitchen atorvastatin (LIPITOR) 20 MG tablet Take 20 mg by mouth daily.  . cholecalciferol (VITAMIN D) 1000 UNITS  tablet Take 2,000 Units by mouth daily.  . Emollient (EUCERIN ORIGINAL HEALING) LOTN Apply 1 application topically 2 (two) times daily.  . feeding supplement (BOOST HIGH PROTEIN) LIQD Take 1 Container by mouth daily.  Marland Kitchen latanoprost (XALATAN) 0.005 % ophthalmic solution Place 1 drop into the right eye at bedtime.  Marland Kitchen levothyroxine (SYNTHROID, LEVOTHROID) 50 MCG tablet TAKE 1 TABLET BY MOUTH DAILY FOR THYROID SUPPLEMENT  . metoprolol succinate (TOPROL XL) 25 MG 24 hr tablet Take 1 tablet (25 mg total) by mouth daily.  Marland Kitchen NAMENDA XR 28 MG CP24 24 hr capsule Take 28 mg by mouth every evening.   . nitroGLYCERIN (NITROSTAT) 0.4 MG SL tablet Place 1 tablet (0.4 mg total) under the tongue every 5 (five) minutes as needed for chest pain.  Marland Kitchen sertraline (ZOLOFT) 25 MG tablet Take 75 mg by mouth at bedtime.    No facility-administered encounter medications on file as of 11/04/2018.     Review of Systems  Constitutional: Negative for activity change, appetite change, chills, diaphoresis, fatigue, fever and unexpected weight change.  Respiratory: Negative for cough, shortness of breath, wheezing and stridor.   Cardiovascular: Negative for chest pain, palpitations and leg swelling.  Gastrointestinal: Negative for abdominal distention, abdominal pain, constipation and diarrhea.  Genitourinary: Negative for difficulty urinating and dysuria.  Musculoskeletal: Positive for gait problem. Negative for arthralgias, back pain, joint swelling and myalgias.  Neurological: Negative for dizziness, seizures, syncope, facial asymmetry, speech difficulty, weakness and headaches.  Hematological: Negative for adenopathy. Does not bruise/bleed easily.  Psychiatric/Behavioral: Positive for confusion. Negative for agitation and behavioral problems. The patient is nervous/anxious.     Immunization History  Administered Date(s) Administered  . Influenza Inj Mdck Quad Pf 03/02/2016  . Influenza Whole 02/13/2012  .  Influenza,inj,Quad PF,6+ Mos 03/05/2018  . Influenza-Unspecified 02/18/2014, 02/25/2015, 05/18/2017  . Pneumococcal Conjugate-13 10/06/2014  . Pneumococcal Polysaccharide-23 05/15/1998  . Tdap 11/28/2016  . Zoster 05/15/2006  . Zoster Recombinat (Shingrix) 04/10/2017, 08/07/2017   Pertinent  Health Maintenance Due  Topic Date Due  . INFLUENZA VACCINE  12/14/2018  . PNA vac Low Risk Adult  Completed   Fall Risk  11/04/2018 07/16/2018 11/28/2017 12/27/2016 11/28/2016  Falls in the past year? 1 0 No No No  Number falls in past yr: 0 0 - - -  Injury with Fall? 0 0 - - -  Comment - - - - -  Risk for fall due to : Impaired balance/gait;Impaired vision Impaired mobility;Medication side effect;Impaired balance/gait;Mental status change - - -  Follow up Falls evaluation completed Falls evaluation completed - - -   Functional Status Survey:    Vitals:   11/04/18 1549  Weight: 184 lb 9.6 oz (83.7 kg)   Body mass index is 27.26 kg/m. Physical Exam Constitutional:      General: He is not in acute distress.    Appearance: He is not diaphoretic.  HENT:     Head: Normocephalic and atraumatic.  Neck:  Thyroid: No thyromegaly.     Vascular: No JVD.     Trachea: No tracheal deviation.  Cardiovascular:     Rate and Rhythm: Normal rate and regular rhythm.     Heart sounds: No murmur.  Pulmonary:     Effort: Pulmonary effort is normal. No respiratory distress.     Breath sounds: Normal breath sounds. No wheezing.  Abdominal:     General: Bowel sounds are normal. There is no distension.     Palpations: Abdomen is soft.     Tenderness: There is no abdominal tenderness.  Musculoskeletal:     Right lower leg: No edema.     Left lower leg: No edema.  Lymphadenopathy:     Cervical: No cervical adenopathy.  Skin:    General: Skin is warm and dry.  Neurological:     General: No focal deficit present.     Mental Status: He is alert. Mental status is at baseline.     Cranial Nerves: No  cranial nerve deficit.  Psychiatric:        Mood and Affect: Mood normal.     Labs reviewed: Recent Labs    03/22/18  NA 139  K 4.3  BUN 32*  CREATININE 1.4*   No results for input(s): AST, ALT, ALKPHOS, BILITOT, PROT, ALBUMIN in the last 8760 hours. Recent Labs    03/22/18  WBC 6.1  HGB 12.1*  HCT 35*  PLT 197   Lab Results  Component Value Date   TSH 1.46 03/22/2018   Lab Results  Component Value Date   HGBA1C 6.1 02/14/2016   Lab Results  Component Value Date   CHOL 164 01/02/2017   HDL 53 01/02/2017   LDLCALC 80 01/02/2017   TRIG 158 01/02/2017   CHOLHDL 4.1 06/01/2012    Significant Diagnostic Results in last 30 days:  No results found.  Assessment/Plan 1. Essential hypertension Controlled, continue Toprol XL 25 mg qd   2. Late onset Alzheimer's disease with behavioral disturbance ([place]) Progressive decline in cognition and physical function c/w the disease. Continue supportive care in the skilled environment. Continue Namenda   3. Hypothyroidism, unspecified type Lab Results  Component Value Date   TSH 1.46 03/22/2018   Continue synthroid 50 mcg qd   4. Cardiomyopathy, ischemic Appears compensated on exam. Continue to monitor.   5. CKD (chronic kidney disease) stage 3, GFR 30-59 ml/min (HCC) Continue to periodically monitor BMP and avoid nephrotoxic agents   6. Chronic gout without tophus, unspecified cause, unspecified site No new issues Continue allopurinol 100 mg qd     Family/ staff Communication: resident and staff   Labs/tests ordered:  NA

## 2018-11-18 ENCOUNTER — Other Ambulatory Visit: Payer: Self-pay | Admitting: Adult Health

## 2018-11-18 MED ORDER — ALPRAZOLAM 0.5 MG PO TABS: 0.2500 mg | ORAL_TABLET | ORAL | 2 refills | Status: AC

## 2018-12-24 ENCOUNTER — Encounter: Payer: Self-pay | Admitting: Internal Medicine

## 2018-12-24 ENCOUNTER — Non-Acute Institutional Stay: Payer: Medicare Other | Admitting: Internal Medicine

## 2018-12-24 DIAGNOSIS — E039 Hypothyroidism, unspecified: Secondary | ICD-10-CM | POA: Diagnosis not present

## 2018-12-24 DIAGNOSIS — I2510 Atherosclerotic heart disease of native coronary artery without angina pectoris: Secondary | ICD-10-CM

## 2018-12-24 DIAGNOSIS — F0280 Dementia in other diseases classified elsewhere without behavioral disturbance: Secondary | ICD-10-CM

## 2018-12-24 DIAGNOSIS — F02818 Dementia in other diseases classified elsewhere, unspecified severity, with other behavioral disturbance: Secondary | ICD-10-CM

## 2018-12-24 DIAGNOSIS — F0281 Dementia in other diseases classified elsewhere with behavioral disturbance: Secondary | ICD-10-CM

## 2018-12-24 DIAGNOSIS — F329 Major depressive disorder, single episode, unspecified: Secondary | ICD-10-CM | POA: Diagnosis not present

## 2018-12-24 DIAGNOSIS — N183 Chronic kidney disease, stage 3 unspecified: Secondary | ICD-10-CM

## 2018-12-24 DIAGNOSIS — C674 Malignant neoplasm of posterior wall of bladder: Secondary | ICD-10-CM

## 2018-12-24 DIAGNOSIS — G301 Alzheimer's disease with late onset: Secondary | ICD-10-CM | POA: Diagnosis not present

## 2018-12-24 DIAGNOSIS — M1A9XX0 Chronic gout, unspecified, without tophus (tophi): Secondary | ICD-10-CM

## 2018-12-24 DIAGNOSIS — F0393 Unspecified dementia, unspecified severity, with mood disturbance: Secondary | ICD-10-CM

## 2018-12-24 DIAGNOSIS — F32A Depression, unspecified: Secondary | ICD-10-CM

## 2018-12-24 NOTE — Progress Notes (Signed)
Patient ID: Damon Miller, male   DOB: 07-06-1919, 85 y.o.   MRN: [id number]  Location:  [facility] Room Number: 712 memory care Place of Service:  ALF [phone]) Provider:   Gayland Curry, DO  Patient Care Team: Gayland Curry, DO as PCP - General (Geriatric Medicine) Community[facility], Denice Bors, MD as Consulting Physician (Cardiology) Jacolyn Reedy, MD as Consulting Physician (Cardiology) Tyler Pita, MD as Consulting Physician (Radiation Oncology) Allyn Kenner, MD as Consulting Physician (Dermatology) Ardis Hughs, MD as Attending Physician (Urology) Royal Hawthorn, NP as Nurse Practitioner (Nurse Practitioner)  Extended Emergency Contact Information Primary Emergency Contact: Vold,"Wat" Tyrone [place] of [place] Phone: [phone] Relation: Son Secondary Emergency Contact: Edsel Petrin "Bab"          [place], [place] Phone: [phone] Relation: Daughter  Code Status:  DNR, needs MOST Goals of care: Advanced Directive information Advanced Directives 07/09/2018  Does Patient Have a Medical Advance Directive? Yes  Type of Paramedic of [place];Out of facility DNR (pink MOST or yellow form)  Does patient want to make changes to medical advance directive? No - Patient declined  Copy of [place] in Chart? Yes - validated most recent copy scanned in chart (See row information)  Pre-existing out of facility DNR order (yellow form or pink MOST form) Yellow form placed in chart (order not valid for inpatient use)     Chief Complaint  Patient presents with  . Medical Management of Chronic Issues    Routine Visit    HPI:  Pt is a 85 y.o. male seen today for medical management of chronic diseases.  [place] has been stable as of late per nursing.  He lives in memory care due to advanced dementia.  He requires assistance with  bathing, dressing, grooming.  He is able to feed himself.  He continues to scream during showers, but then thanks the CNAs who helped to bathe him.  He's overall very pleasant and grateful otherwise.  He taps his fingers when sitting.    When seen, he denied pain.  He was sitting comfortably in his chair in his room and smiled and thanked me for coming to visit him and suggested I come again.  Nursing has not reporting any concerns about any recurrence of his hematuria (prior bladder ca).  Bowels are moving well.  He sleeps well at night.  He's in good spirits.  Weight is down 3 lbs since June, but he does eat.    Past Medical History:  Diagnosis Date  . Abnormality of gait   . Allergic rhinitis due to pollen   . Arthritis   . CAD (coronary artery disease)   . Cataracts, bilateral   . Chronic kidney disease, stage II (mild)   . Dementia ([place])   . Diverticulosis   . DNR (do not resuscitate)   . Edema    wears compression hose  . GERD (gastroesophageal reflux disease)   . Glaucoma    Right eye  . Gout, unspecified   . Hernia, hiatal   . Hyperglycemia 2014  . Hyperlipemia   . Hypertension   . Hypothyroid   . Myocardial infarction ([place])   . Prostate cancer ([place])   . Vitamin D deficiency disease    Past Surgical History:  Procedure Laterality Date  . COLONOSCOPY  07/1990, 10/1992, 12/1995 and 02/1999   Polyps removed at each procedure  .  EYE SURGERY Bilateral 1999   Cataract removal  . SKIN CANCER EXCISION  2004   Ears  . TRANSURETHRAL RESECTION OF BLADDER TUMOR N/A 02/24/2016   Procedure: TRANSURETHRAL RESECTION OF BLADDER TUMOR (TURBT), BILATERAL RETROGRADE POLYGRAM;  Surgeon: Ardis Hughs, MD;  Location: [facility];  Service: Urology;  Laterality: N/A;    Allergies  Allergen Reactions  . Ibuprofen Rash    Outpatient Encounter Medications as of 12/24/2018  Medication Sig  . acetaminophen (TYLENOL) 325 MG tablet Take 650 mg by mouth 2 (two) times daily as needed.   Marland Kitchen  allopurinol (ZYLOPRIM) 100 MG tablet TAKE 1 TABLET ONCE DAILY TO LOWER URIC ACID.  Marland Kitchen ALPRAZolam (XANAX) 0.5 MG tablet Take 0.5 tablets (0.25 mg total) by mouth 2 (two) times a week. On Wednesday and Saturday prior to showers.  Marland Kitchen atorvastatin (LIPITOR) 20 MG tablet Take 20 mg by mouth daily.  . cholecalciferol (VITAMIN D) 1000 UNITS tablet Take 2,000 Units by mouth daily.  . Emollient (EUCERIN ORIGINAL HEALING) LOTN Apply 1 application topically 2 (two) times daily.  . feeding supplement (BOOST HIGH PROTEIN) LIQD Take 1 Container by mouth daily.  Marland Kitchen latanoprost (XALATAN) 0.005 % ophthalmic solution Place 1 drop into the right eye at bedtime.  Marland Kitchen levothyroxine (SYNTHROID, LEVOTHROID) 50 MCG tablet TAKE 1 TABLET BY MOUTH DAILY FOR THYROID SUPPLEMENT  . metoprolol succinate (TOPROL XL) 25 MG 24 hr tablet Take 1 tablet (25 mg total) by mouth daily.  Marland Kitchen NAMENDA XR 28 MG CP24 24 hr capsule Take 28 mg by mouth every evening.   . nitroGLYCERIN (NITROSTAT) 0.4 MG SL tablet Place 1 tablet (0.4 mg total) under the tongue every 5 (five) minutes as needed for chest pain.  Marland Kitchen sertraline (ZOLOFT) 25 MG tablet Take 75 mg by mouth at bedtime.    No facility-administered encounter medications on file as of 12/24/2018.     Review of Systems  Constitutional: Positive for unexpected weight change. Negative for activity change, appetite change, chills and fever.       Weight may have dropped with covid isolation changes past few months  HENT: Negative for congestion.   Eyes: Negative for visual disturbance.       Glasses, says he can see well (?)  Respiratory: Negative for choking and shortness of breath.   Gastrointestinal: Negative for abdominal pain, blood in stool, constipation, diarrhea, nausea and vomiting.  Genitourinary: Negative for dysuria.  Musculoskeletal: Positive for gait problem. Negative for arthralgias and back pain.       Walks with rolling walker with skis  Skin: Negative for color change.    Neurological: Negative for dizziness and weakness.  Hematological: Bruises/bleeds easily.  Psychiatric/Behavioral: Positive for confusion. Negative for agitation, behavioral problems and sleep disturbance. The patient is nervous/anxious.        Screams during showers    Immunization History  Administered Date(s) Administered  . Influenza Inj Mdck Quad Pf 03/02/2016  . Influenza Whole 02/13/2012  . Influenza,inj,Quad PF,6+ Mos 03/05/2018  . Influenza-Unspecified 02/18/2014, 02/25/2015, 05/18/2017  . Pneumococcal Conjugate-13 10/06/2014  . Pneumococcal Polysaccharide-23 05/15/1998  . Tdap 11/28/2016  . Zoster 05/15/2006  . Zoster Recombinat (Shingrix) 04/10/2017, 08/07/2017   Pertinent  Health Maintenance Due  Topic Date Due  . INFLUENZA VACCINE  12/14/2018  . PNA vac Low Risk Adult  Completed   Fall Risk  11/04/2018 07/16/2018 11/28/2017 12/27/2016 11/28/2016  Falls in the past year? 1 0 No No No  Number falls in past yr: 0 0 - - -  Injury with Fall? 0 0 - - -  Comment - - - - -  Risk for fall due to : Impaired balance/gait;Impaired vision Impaired mobility;Medication side effect;Impaired balance/gait;Mental status change - - -  Follow up Falls evaluation completed Falls evaluation completed - - -   Functional Status Survey:    Vitals:   12/24/18 1130  BP: (!) 152/61  Pulse: 65  Resp: 16  Temp: (!) 97.4 F (36.3 C)  TempSrc: Oral  SpO2: 95%  Weight: 181 lb (82.1 kg)  Height: 5\' 9"  (1.753 m)   Body mass index is 26.73 kg/m. Physical Exam Constitutional:      General: He is not in acute distress.    Appearance: Normal appearance. He is normal weight. He is not ill-appearing or toxic-appearing.  HENT:     Head: Normocephalic and atraumatic.  Eyes:     Extraocular Movements: Extraocular movements intact.     Pupils: Pupils are equal, round, and reactive to light.     Comments: glasses  Cardiovascular:     Rate and Rhythm: Normal rate and regular rhythm.     Heart  sounds: Normal heart sounds.  Pulmonary:     Effort: Pulmonary effort is normal.     Breath sounds: Normal breath sounds. No wheezing, rhonchi or rales.  Abdominal:     General: Bowel sounds are normal. There is no distension.     Palpations: Abdomen is soft. There is no mass.     Tenderness: There is no abdominal tenderness.  Musculoskeletal: Normal range of motion.     Right lower leg: No edema.     Left lower leg: No edema.  Skin:    General: Skin is warm and dry.     Capillary Refill: Capillary refill takes less than 2 seconds.  Neurological:     General: No focal deficit present.     Mental Status: He is alert. Mental status is at baseline.     Cranial Nerves: No cranial nerve deficit.     Motor: No weakness.     Comments: Walks slowly with rolling walker with skis  Psychiatric:        Mood and Affect: Mood normal.     Labs reviewed: Recent Labs    03/22/18  NA 139  K 4.3  BUN 32*  CREATININE 1.4*   No results for input(s): AST, ALT, ALKPHOS, BILITOT, PROT, ALBUMIN in the last 8760 hours. Recent Labs    03/22/18  WBC 6.1  HGB 12.1*  HCT 35*  PLT 197   Lab Results  Component Value Date   TSH 1.46 03/22/2018   Lab Results  Component Value Date   HGBA1C 6.1 02/14/2016   Lab Results  Component Value Date   CHOL 164 01/02/2017   HDL 53 01/02/2017   LDLCALC 80 01/02/2017   TRIG 158 01/02/2017   CHOLHDL 4.1 06/01/2012    Assessment/Plan 1. Late onset Alzheimer's disease with behavioral disturbance ([place]) -continue namenda therapy and memory care support for adls and psychosocial wellbeing  2. Depression due to dementia [facility]) -doing fine with zoloft, previously was crying at times before move to memory care -gets xanax for anxiety during showers, but nursing reports he screams anyway so might consider weaning to prn and then stop if not used  3. Coronary artery disease involving native coronary artery of native heart without angina pectoris -no episodes  of chest pain, has prn ntg available, continue also on toprol xl, remains on lipitor, as well -if  he develops difficulty taking pills with either dysphagia or due to his dementia, would stop the lipitor, but this has not been reported  4. Hypothyroidism, unspecified type -cont current levothyroxine therapy - plan to check annually in november Lab Results  Component Value Date   TSH 1.46 03/22/2018    5. CKD (chronic kidney disease) stage 3, GFR 30-59 ml/min (HCC) -Avoid nephrotoxic agents like nsaids, dose adjust renally excreted meds, hydrate.  6. Malignant neoplasm of posterior wall of urinary bladder (HCC) -no recent hematuria--monitor but no longer have plans for aggressive care with this  7. Chronic gout without tophus, unspecified cause, unspecified site -continues on daily allopurinol, no recent flare-ups  Family/ staff Communication: discussed with memory care nurse  Labs/tests ordered:  No new  Lira Stephen L. Dorathy Stallone, D.O. [facility] [address], [place] [address] Cell Phone (Mon-Fri 8am-5pm):  [phone] On Call:  [phone] & follow prompts after 5pm & weekends Office Phone:  [phone] Office Fax:  [phone]

## 2019-01-10 ENCOUNTER — Non-Acute Institutional Stay: Payer: Medicare Other | Admitting: Adult Health

## 2019-01-10 ENCOUNTER — Encounter: Payer: Self-pay | Admitting: Adult Health

## 2019-01-10 DIAGNOSIS — W19XXXA Unspecified fall, initial encounter: Secondary | ICD-10-CM | POA: Diagnosis not present

## 2019-01-10 DIAGNOSIS — M25532 Pain in left wrist: Secondary | ICD-10-CM | POA: Diagnosis not present

## 2019-01-10 DIAGNOSIS — R531 Weakness: Secondary | ICD-10-CM | POA: Diagnosis not present

## 2019-01-10 DIAGNOSIS — D649 Anemia, unspecified: Secondary | ICD-10-CM | POA: Diagnosis not present

## 2019-01-10 DIAGNOSIS — R296 Repeated falls: Secondary | ICD-10-CM | POA: Diagnosis not present

## 2019-01-10 DIAGNOSIS — M25432 Effusion, left wrist: Secondary | ICD-10-CM

## 2019-01-10 DIAGNOSIS — S0003XA Contusion of scalp, initial encounter: Secondary | ICD-10-CM

## 2019-01-10 DIAGNOSIS — M79642 Pain in left hand: Secondary | ICD-10-CM | POA: Diagnosis not present

## 2019-01-10 DIAGNOSIS — M79632 Pain in left forearm: Secondary | ICD-10-CM | POA: Diagnosis not present

## 2019-01-10 DIAGNOSIS — E039 Hypothyroidism, unspecified: Secondary | ICD-10-CM | POA: Diagnosis not present

## 2019-01-10 LAB — BASIC METABOLIC PANEL
BUN: 40 — AB (ref 4–21)
Creatinine: 1.9 — AB (ref 0.6–1.3)
Glucose: 97
Potassium: 4.9 (ref 3.4–5.3)
Sodium: 144 (ref 137–147)

## 2019-01-10 LAB — TSH: TSH: 1.65 (ref 0.41–5.90)

## 2019-01-10 LAB — CBC AND DIFFERENTIAL
HCT: 36 — AB (ref 41–53)
Hemoglobin: 12.2 — AB (ref 13.5–17.5)
Platelets: 214 (ref 150–399)
WBC: 7.5

## 2019-01-10 NOTE — Progress Notes (Signed)
Location:  Occupational psychologist of Service:  ALF (13) Provider:   Cindi Carbon, ANP [place] [phone]   Gayland Curry, DO  Patient Care Team: Gayland Curry, DO as PCP - General (Geriatric Medicine) Community, [facility], Denice Bors, MD as Consulting Physician (Cardiology) Jacolyn Reedy, MD as Consulting Physician (Cardiology) Tyler Pita, MD as Consulting Physician (Radiation Oncology) Allyn Kenner, MD as Consulting Physician (Dermatology) Ardis Hughs, MD as Attending Physician (Urology) Royal Hawthorn, NP as Nurse Practitioner (Nurse Practitioner)  Extended Emergency Contact Information Primary Emergency Contact: Raybuck,"Wat" Tyrone [place] of [place] Phone: [phone] Relation: Son Secondary Emergency Contact: Edsel Petrin "Bab"          [place], [place] Phone: [phone] Relation: Daughter  Code Status:  DNR Goals of care: Advanced Directive information Advanced Directives 07/09/2018  Does Patient Have a Medical Advance Directive? Yes  Type of Paramedic of [place];Out of facility DNR (pink MOST or yellow form)  Does patient want to make changes to medical advance directive? No - Patient declined  Copy of [place] in Chart? Yes - validated most recent copy scanned in chart (See row information)  Pre-existing out of facility DNR order (yellow form or pink MOST form) Yellow form placed in chart (order not valid for inpatient use)     Chief Complaint  Patient presents with  . Acute Visit    fall     HPI:  Pt is a 85 y.o. male seen today for an acute visit for a fall. He has fallen two times in the past two days. The nurses report he is weaker and having difficulty standing.He typically ambulates in his room without assistance but now is not able to do so.  He has advanced dementia and resides in the  memory care unit and is not able to contribute to the history. He can answer questions but not reliably. He reports that the back of his head hurts and his left wrist with movement. He does not remember why he fell. The nurse reports indicate no LOC. No fever, cough, sob, dysuria, frequency, etc. He was found beside his bed with his walker on top of him 01/10/2019. He hit the back of his head on the floor and has a hematoma. No focal neuro deficits are noted. VS are stable. He is eating and drinking as usual per staff.    Past Medical History:  Diagnosis Date  . Abnormality of gait   . Allergic rhinitis due to pollen   . Arthritis   . CAD (coronary artery disease)   . Cataracts, bilateral   . Chronic kidney disease, stage II (mild)   . Dementia ([place])   . Diverticulosis   . DNR (do not resuscitate)   . Edema    wears compression hose  . GERD (gastroesophageal reflux disease)   . Glaucoma    Right eye  . Gout, unspecified   . Hernia, hiatal   . Hyperglycemia 2014  . Hyperlipemia   . Hypertension   . Hypothyroid   . Myocardial infarction ([place])   . Prostate cancer ([place])   . Vitamin D deficiency disease    Past Surgical History:  Procedure Laterality Date  . COLONOSCOPY  07/1990, 10/1992, 12/1995 and 02/1999   Polyps removed at each procedure  . EYE SURGERY Bilateral 1999   Cataract removal  . SKIN CANCER EXCISION  2004  Ears  . TRANSURETHRAL RESECTION OF BLADDER TUMOR N/A 02/24/2016   Procedure: TRANSURETHRAL RESECTION OF BLADDER TUMOR (TURBT), BILATERAL RETROGRADE POLYGRAM;  Surgeon: Ardis Hughs, MD;  Location: [facility];  Service: Urology;  Laterality: N/A;    Allergies  Allergen Reactions  . Ibuprofen Rash    Outpatient Encounter Medications as of 01/10/2019  Medication Sig  . allopurinol (ZYLOPRIM) 100 MG tablet TAKE 1 TABLET ONCE DAILY TO LOWER URIC ACID.  Marland Kitchen atorvastatin (LIPITOR) 20 MG tablet Take 20 mg by mouth daily.  . cholecalciferol (VITAMIN D) 1000 UNITS  tablet Take 2,000 Units by mouth daily.  . Emollient (EUCERIN ORIGINAL HEALING) LOTN Apply 1 application topically 2 (two) times daily.  . feeding supplement (BOOST HIGH PROTEIN) LIQD Take 1 Container by mouth daily.  Marland Kitchen latanoprost (XALATAN) 0.005 % ophthalmic solution Place 1 drop into the right eye at bedtime.  Marland Kitchen levothyroxine (SYNTHROID, LEVOTHROID) 50 MCG tablet TAKE 1 TABLET BY MOUTH DAILY FOR THYROID SUPPLEMENT  . metoprolol succinate (TOPROL XL) 25 MG 24 hr tablet Take 1 tablet (25 mg total) by mouth daily.  Marland Kitchen NAMENDA XR 28 MG CP24 24 hr capsule Take 28 mg by mouth every evening.   . nitroGLYCERIN (NITROSTAT) 0.4 MG SL tablet Place 1 tablet (0.4 mg total) under the tongue every 5 (five) minutes as needed for chest pain.  Marland Kitchen acetaminophen (TYLENOL) 325 MG tablet Take 650 mg by mouth 2 (two) times daily as needed.   . ALPRAZolam (XANAX) 0.5 MG tablet Take 0.5 tablets (0.25 mg total) by mouth 2 (two) times a week. On Wednesday and Saturday prior to showers.  . sertraline (ZOLOFT) 25 MG tablet Take 75 mg by mouth at bedtime.    No facility-administered encounter medications on file as of 01/10/2019.     Review of Systems  Constitutional: Positive for activity change. Negative for appetite change, chills, diaphoresis, fatigue, fever and unexpected weight change.  Respiratory: Negative for cough, shortness of breath, wheezing and stridor.   Cardiovascular: Positive for leg swelling. Negative for chest pain and palpitations.  Gastrointestinal: Negative for abdominal distention, abdominal pain, constipation and diarrhea.  Genitourinary: Negative for difficulty urinating and dysuria.  Musculoskeletal: Positive for arthralgias, gait problem and joint swelling. Negative for back pain and myalgias.  Neurological: Positive for weakness. Negative for dizziness, seizures, syncope, facial asymmetry, speech difficulty and headaches.  Hematological: Negative for adenopathy. Does not bruise/bleed easily.   Psychiatric/Behavioral: Positive for confusion. Negative for agitation and behavioral problems.    Immunization History  Administered Date(s) Administered  . Influenza Inj Mdck Quad Pf 03/02/2016  . Influenza Whole 02/13/2012  . Influenza,inj,Quad PF,6+ Mos 03/05/2018  . Influenza-Unspecified 02/18/2014, 02/25/2015, 05/18/2017  . Pneumococcal Conjugate-13 10/06/2014  . Pneumococcal Polysaccharide-23 05/15/1998  . Tdap 11/28/2016  . Zoster 05/15/2006  . Zoster Recombinat (Shingrix) 04/10/2017, 08/07/2017   Pertinent  Health Maintenance Due  Topic Date Due  . INFLUENZA VACCINE  12/14/2018  . PNA vac Low Risk Adult  Completed   Fall Risk  11/04/2018 07/16/2018 11/28/2017 12/27/2016 11/28/2016  Falls in the past year? 1 0 No No No  Number falls in past yr: 0 0 - - -  Injury with Fall? 0 0 - - -  Comment - - - - -  Risk for fall due to : Impaired balance/gait;Impaired vision Impaired mobility;Medication side effect;Impaired balance/gait;Mental status change - - -  Follow up Falls evaluation completed Falls evaluation completed - - -   Functional Status Survey:    Vitals:  01/10/19 1055  BP: 107/79  Pulse: 64  Resp: 16  Temp: 98 F (36.7 C)  SpO2: 93%   There is no height or weight on file to calculate BMI. Physical Exam Constitutional:      General: He is not in acute distress.    Appearance: He is not diaphoretic.  HENT:     Head: Contusion present. No raccoon eyes or laceration.   Neck:     Musculoskeletal: No neck rigidity.     Thyroid: No thyromegaly.     Vascular: No JVD.     Trachea: No tracheal deviation.  Cardiovascular:     Rate and Rhythm: Normal rate and regular rhythm.     Heart sounds: No murmur.  Pulmonary:     Effort: Pulmonary effort is normal. No respiratory distress.     Breath sounds: Normal breath sounds. No wheezing.  Abdominal:     General: Bowel sounds are normal. There is no distension.     Palpations: Abdomen is soft.     Tenderness: There  is no abdominal tenderness.  Musculoskeletal: Normal range of motion.        General: Swelling (left wrist and forearm with bruising) and tenderness (left wrist and forearm) present. No deformity.     Right lower leg: No edema.     Left lower leg: No edema.     Comments: +CMS to left wrist No pain with ROM of the either hip, knee ankles, shoulder or elbow. Pain with rom is noted to the left wrist.  Lymphadenopathy:     Cervical: No cervical adenopathy.  Skin:    General: Skin is warm and dry.  Neurological:     General: No focal deficit present.     Mental Status: He is alert. Mental status is at baseline.     Cranial Nerves: No cranial nerve deficit.     Motor: Weakness (general) present.     Labs reviewed: Recent Labs    03/22/18  NA 139  K 4.3  BUN 32*  CREATININE 1.4*   No results for input(s): AST, ALT, ALKPHOS, BILITOT, PROT, ALBUMIN in the last 8760 hours. Recent Labs    03/22/18  WBC 6.1  HGB 12.1*  HCT 35*  PLT 197   Lab Results  Component Value Date   TSH 1.46 03/22/2018   Lab Results  Component Value Date   HGBA1C 6.1 02/14/2016   Lab Results  Component Value Date   CHOL 164 01/02/2017   HDL 53 01/02/2017   LDLCALC 80 01/02/2017   TRIG 158 01/02/2017   CHOLHDL 4.1 06/01/2012    Significant Diagnostic Results in last 30 days:  No results found.  Assessment/Plan 1. Fall, initial encounter Due to weakness. Its unclear if this is related to progressive dementia or an acute issue. Will be further evaluated with labs.   2. Hematoma of scalp, initial encounter No focal deficit. Family notified Continue to monitor neuro checks and vitals at [facility]  3. Pain and swelling of left wrist ICE 15 min tid x 48 hrs Tylenol as needed for pain Xray left hand, wrist, forearm 2 view  4. Weakness Check orthostatic vitals He has been weak and had difficulty ambulating for the past two days. He is due soon for labs so we will go ahead and order a CBC, BMP  and TSH to rule out any reversible causes. He has no urinary or respiratory symptoms to warrant testing at this time. PT may get in volved with his  care next week if this continues with no other identified cause.      Family/ staff Communication: staff to communicate with his daughter  Labs/tests ordered:  As above

## 2019-01-16 ENCOUNTER — Encounter: Payer: Self-pay | Admitting: Internal Medicine

## 2019-01-27 DIAGNOSIS — Z961 Presence of intraocular lens: Secondary | ICD-10-CM | POA: Diagnosis not present

## 2019-01-27 DIAGNOSIS — H353131 Nonexudative age-related macular degeneration, bilateral, early dry stage: Secondary | ICD-10-CM | POA: Diagnosis not present

## 2019-01-27 DIAGNOSIS — H401411 Capsular glaucoma with pseudoexfoliation of lens, right eye, mild stage: Secondary | ICD-10-CM | POA: Diagnosis not present

## 2019-01-28 DIAGNOSIS — M6389 Disorders of muscle in diseases classified elsewhere, multiple sites: Secondary | ICD-10-CM | POA: Diagnosis not present

## 2019-01-28 DIAGNOSIS — G301 Alzheimer's disease with late onset: Secondary | ICD-10-CM | POA: Diagnosis not present

## 2019-01-28 DIAGNOSIS — M25432 Effusion, left wrist: Secondary | ICD-10-CM | POA: Diagnosis not present

## 2019-01-28 DIAGNOSIS — M25532 Pain in left wrist: Secondary | ICD-10-CM | POA: Diagnosis not present

## 2019-01-29 DIAGNOSIS — M6389 Disorders of muscle in diseases classified elsewhere, multiple sites: Secondary | ICD-10-CM | POA: Diagnosis not present

## 2019-01-29 DIAGNOSIS — M25432 Effusion, left wrist: Secondary | ICD-10-CM | POA: Diagnosis not present

## 2019-01-29 DIAGNOSIS — M25532 Pain in left wrist: Secondary | ICD-10-CM | POA: Diagnosis not present

## 2019-01-29 DIAGNOSIS — G301 Alzheimer's disease with late onset: Secondary | ICD-10-CM | POA: Diagnosis not present

## 2019-01-30 DIAGNOSIS — M25432 Effusion, left wrist: Secondary | ICD-10-CM | POA: Diagnosis not present

## 2019-01-30 DIAGNOSIS — M25532 Pain in left wrist: Secondary | ICD-10-CM | POA: Diagnosis not present

## 2019-01-30 DIAGNOSIS — G301 Alzheimer's disease with late onset: Secondary | ICD-10-CM | POA: Diagnosis not present

## 2019-01-30 DIAGNOSIS — M6389 Disorders of muscle in diseases classified elsewhere, multiple sites: Secondary | ICD-10-CM | POA: Diagnosis not present

## 2019-02-04 DIAGNOSIS — G301 Alzheimer's disease with late onset: Secondary | ICD-10-CM | POA: Diagnosis not present

## 2019-02-04 DIAGNOSIS — M25532 Pain in left wrist: Secondary | ICD-10-CM | POA: Diagnosis not present

## 2019-02-04 DIAGNOSIS — M6389 Disorders of muscle in diseases classified elsewhere, multiple sites: Secondary | ICD-10-CM | POA: Diagnosis not present

## 2019-02-04 DIAGNOSIS — M25432 Effusion, left wrist: Secondary | ICD-10-CM | POA: Diagnosis not present

## 2019-02-05 DIAGNOSIS — M6389 Disorders of muscle in diseases classified elsewhere, multiple sites: Secondary | ICD-10-CM | POA: Diagnosis not present

## 2019-02-05 DIAGNOSIS — M25532 Pain in left wrist: Secondary | ICD-10-CM | POA: Diagnosis not present

## 2019-02-05 DIAGNOSIS — M25432 Effusion, left wrist: Secondary | ICD-10-CM | POA: Diagnosis not present

## 2019-02-05 DIAGNOSIS — G301 Alzheimer's disease with late onset: Secondary | ICD-10-CM | POA: Diagnosis not present

## 2019-02-06 ENCOUNTER — Encounter: Payer: Self-pay | Admitting: Adult Health

## 2019-02-06 ENCOUNTER — Non-Acute Institutional Stay: Payer: Medicare Other | Admitting: Adult Health

## 2019-02-06 DIAGNOSIS — F0281 Dementia in other diseases classified elsewhere with behavioral disturbance: Secondary | ICD-10-CM

## 2019-02-06 DIAGNOSIS — G301 Alzheimer's disease with late onset: Secondary | ICD-10-CM

## 2019-02-06 DIAGNOSIS — F02818 Dementia in other diseases classified elsewhere, unspecified severity, with other behavioral disturbance: Secondary | ICD-10-CM

## 2019-02-06 DIAGNOSIS — M1A00X0 Idiopathic chronic gout, unspecified site, without tophus (tophi): Secondary | ICD-10-CM

## 2019-02-06 DIAGNOSIS — M6389 Disorders of muscle in diseases classified elsewhere, multiple sites: Secondary | ICD-10-CM | POA: Diagnosis not present

## 2019-02-06 DIAGNOSIS — M25432 Effusion, left wrist: Secondary | ICD-10-CM | POA: Diagnosis not present

## 2019-02-06 DIAGNOSIS — M25532 Pain in left wrist: Secondary | ICD-10-CM | POA: Diagnosis not present

## 2019-02-06 NOTE — Progress Notes (Signed)
Location:  Occupational psychologist of Service:  ALF (13) Provider:   Cindi Carbon, ANP [place] [phone]   Gayland Curry, DO  Patient Care Team: Gayland Curry, DO as PCP - General (Geriatric Medicine) Community, [facility], Denice Bors, MD as Consulting Physician (Cardiology) Jacolyn Reedy, MD as Consulting Physician (Cardiology) Tyler Pita, MD as Consulting Physician (Radiation Oncology) Allyn Kenner, MD as Consulting Physician (Dermatology) Ardis Hughs, MD as Attending Physician (Urology) Royal Hawthorn, NP as Nurse Practitioner (Nurse Practitioner)  Extended Emergency Contact Information Primary Emergency Contact: Kliebert,"Wat" Tyrone [place] of [place] Phone: [phone] Relation: Son Secondary Emergency Contact: Edsel Petrin "Bab"          [place], [place] Phone: [phone] Relation: Daughter  Code Status:  DNR Goals of care: Advanced Directive information Advanced Directives 07/09/2018  Does Patient Have a Medical Advance Directive? Yes  Type of Paramedic of [place];Out of facility DNR (pink MOST or yellow form)  Does patient want to make changes to medical advance directive? No - Patient declined  Copy of [place] in Chart? Yes - validated most recent copy scanned in chart (See row information)  Pre-existing out of facility DNR order (yellow form or pink MOST form) Yellow form placed in chart (order not valid for inpatient use)     Chief Complaint  Patient presents with  . Acute Visit    combative behavior    HPI:  Pt is a 85 y.o. male seen today for an acute visit for combative behavior. The nurse reports that Mr. Mittag is combative with care, hitting and kicking the staff. He is not easily re directed. Most of the day he is pleasant but seems to have aggression during the morning hours. He is also  weaker and needs more assistance with care. Needs a lift for transfers. He sits in the chair most of the time. He has obtained skin tears due to falls and swinging his arms. OT felt that he was in pain last week and so we scheduled tylenol but this has not helped with his behaviors. He verbalizes no complaints and is pleasant during my visit. Vitals are stable. Labs checked due to his decline have not shown acute findings. He has a hx of yelling and screaming during his baths and takes xanax for this issue but apparently his behavior has worsened.  He fell on 8.28 and had bruising to the left wrist. It was xrayed and did not show a fracture. No pain is reported to this area since that time but now it is warm and more swollen and red. There is a skin tear to the forearm as well that is almost healed. No fever or drainage.    Past Medical History:  Diagnosis Date  . Abnormality of gait   . Allergic rhinitis due to pollen   . Arthritis   . CAD (coronary artery disease)   . Cataracts, bilateral   . Chronic kidney disease, stage II (mild)   . Dementia ([place])   . Diverticulosis   . DNR (do not resuscitate)   . Edema    wears compression hose  . GERD (gastroesophageal reflux disease)   . Glaucoma    Right eye  . Gout, unspecified   . Hernia, hiatal   . Hyperglycemia 2014  . Hyperlipemia   . Hypertension   . Hypothyroid   . Myocardial infarction ([place])   .  Prostate cancer ([place])   . Vitamin D deficiency disease    Past Surgical History:  Procedure Laterality Date  . COLONOSCOPY  07/1990, 10/1992, 12/1995 and 02/1999   Polyps removed at each procedure  . EYE SURGERY Bilateral 1999   Cataract removal  . SKIN CANCER EXCISION  2004   Ears  . TRANSURETHRAL RESECTION OF BLADDER TUMOR N/A 02/24/2016   Procedure: TRANSURETHRAL RESECTION OF BLADDER TUMOR (TURBT), BILATERAL RETROGRADE POLYGRAM;  Surgeon: Ardis Hughs, MD;  Location: [facility];  Service: Urology;  Laterality: N/A;    Allergies   Allergen Reactions  . Ibuprofen Rash    Outpatient Encounter Medications as of 02/06/2019  Medication Sig  . acetaminophen (TYLENOL) 500 MG tablet Take 1,000 mg by mouth 2 (two) times daily.  Marland Kitchen acetaminophen (TYLENOL) 325 MG tablet Take 650 mg by mouth 2 (two) times daily as needed.   Marland Kitchen allopurinol (ZYLOPRIM) 100 MG tablet TAKE 1 TABLET ONCE DAILY TO LOWER URIC ACID.  Marland Kitchen ALPRAZolam (XANAX) 0.5 MG tablet Take 0.5 tablets (0.25 mg total) by mouth 2 (two) times a week. On Wednesday and Saturday prior to showers.  Marland Kitchen atorvastatin (LIPITOR) 20 MG tablet Take 20 mg by mouth daily.  . cholecalciferol (VITAMIN D) 1000 UNITS tablet Take 2,000 Units by mouth daily.  . Emollient (EUCERIN ORIGINAL HEALING) LOTN Apply 1 application topically 2 (two) times daily.  . feeding supplement (BOOST HIGH PROTEIN) LIQD Take 1 Container by mouth daily.  Marland Kitchen latanoprost (XALATAN) 0.005 % ophthalmic solution Place 1 drop into the right eye at bedtime.  Marland Kitchen levothyroxine (SYNTHROID, LEVOTHROID) 50 MCG tablet TAKE 1 TABLET BY MOUTH DAILY FOR THYROID SUPPLEMENT  . metoprolol succinate (TOPROL XL) 25 MG 24 hr tablet Take 1 tablet (25 mg total) by mouth daily.  Marland Kitchen NAMENDA XR 28 MG CP24 24 hr capsule Take 28 mg by mouth every evening.   . nitroGLYCERIN (NITROSTAT) 0.4 MG SL tablet Place 1 tablet (0.4 mg total) under the tongue every 5 (five) minutes as needed for chest pain.  Marland Kitchen sertraline (ZOLOFT) 25 MG tablet Take 75 mg by mouth at bedtime.    No facility-administered encounter medications on file as of 02/06/2019.     Review of Systems  Constitutional: Positive for activity change. Negative for appetite change, chills, diaphoresis, fatigue, fever and unexpected weight change.  Respiratory: Negative for cough, shortness of breath, wheezing and stridor.   Cardiovascular: Negative for chest pain, palpitations and leg swelling.  Gastrointestinal: Negative for abdominal distention, abdominal pain, constipation and diarrhea.   Genitourinary: Negative for difficulty urinating and dysuria.  Musculoskeletal: Positive for arthralgias, gait problem and joint swelling. Negative for back pain and myalgias.  Neurological: Negative for dizziness, seizures, syncope, facial asymmetry, speech difficulty, weakness and headaches.  Hematological: Negative for adenopathy. Does not bruise/bleed easily.  Psychiatric/Behavioral: Positive for agitation, behavioral problems and confusion. Negative for hallucinations and sleep disturbance. The patient is nervous/anxious.     Immunization History  Administered Date(s) Administered  . Influenza Inj Mdck Quad Pf 03/02/2016  . Influenza Whole 02/13/2012  . Influenza,inj,Quad PF,6+ Mos 03/05/2018  . Influenza-Unspecified 02/18/2014, 02/25/2015, 05/18/2017  . Pneumococcal Conjugate-13 10/06/2014  . Pneumococcal Polysaccharide-23 05/15/1998  . Tdap 11/28/2016  . Zoster 05/15/2006  . Zoster Recombinat (Shingrix) 04/10/2017, 08/07/2017   Pertinent  Health Maintenance Due  Topic Date Due  . INFLUENZA VACCINE  12/14/2018  . PNA vac Low Risk Adult  Completed   Fall Risk  11/04/2018 07/16/2018 11/28/2017 12/27/2016 11/28/2016  Falls in the  past year? 1 0 No No No  Number falls in past yr: 0 0 - - -  Injury with Fall? 0 0 - - -  Comment - - - - -  Risk for fall due to : Impaired balance/gait;Impaired vision Impaired mobility;Medication side effect;Impaired balance/gait;Mental status change - - -  Follow up Falls evaluation completed Falls evaluation completed - - -   Functional Status Survey:    There were no vitals filed for this visit. There is no height or weight on file to calculate BMI. Physical Exam Vitals signs and nursing note reviewed.  Constitutional:      General: He is not in acute distress.    Appearance: He is not diaphoretic.  HENT:     Head: Normocephalic and atraumatic.     Mouth/Throat:     Mouth: Mucous membranes are moist.     Pharynx: Oropharynx is clear. No  oropharyngeal exudate.  Eyes:     General:        Right eye: No discharge.        Left eye: No discharge.     Conjunctiva/sclera: Conjunctivae normal.     Pupils: Pupils are equal, round, and reactive to light.  Neck:     Musculoskeletal: Normal range of motion and neck supple.     Thyroid: No thyromegaly.     Vascular: No JVD.     Trachea: No tracheal deviation.  Cardiovascular:     Rate and Rhythm: Normal rate and regular rhythm.     Heart sounds: No murmur.  Pulmonary:     Effort: Pulmonary effort is normal. No respiratory distress.     Breath sounds: Normal breath sounds. No wheezing.  Abdominal:     General: Bowel sounds are normal. There is no distension.     Palpations: Abdomen is soft.     Tenderness: There is no abdominal tenderness.  Musculoskeletal:     Right lower leg: No edema.     Left lower leg: No edema.  Lymphadenopathy:     Cervical: No cervical adenopathy.  Skin:    General: Skin is warm and dry.     Comments: Left forearm skin tear well approximated and mostly healed. No drainage. Left wrist and forearm with mild erythema, swelling, and warmth. Ecchymoses to both forearms as well.   Neurological:     General: No focal deficit present.     Mental Status: He is alert. Mental status is at baseline.     Cranial Nerves: No cranial nerve deficit.  Psychiatric:        Mood and Affect: Mood normal.     Labs reviewed: Recent Labs    03/22/18 01/10/19  NA 139 144  K 4.3 4.9  BUN 32* 40*  CREATININE 1.4* 1.9*   No results for input(s): AST, ALT, ALKPHOS, BILITOT, PROT, ALBUMIN in the last 8760 hours. Recent Labs    03/22/18 01/10/19  WBC 6.1 7.5  HGB 12.1* 12.2*  HCT 35* 36*  PLT 197 214   Lab Results  Component Value Date   TSH 1.65 01/10/2019   Lab Results  Component Value Date   HGBA1C 6.1 02/14/2016   Lab Results  Component Value Date   CHOL 164 01/02/2017   HDL 53 01/02/2017   LDLCALC 80 01/02/2017   TRIG 158 01/02/2017   CHOLHDL 4.1  06/01/2012    Significant Diagnostic Results in last 30 days:  No results found.  Assessment/Plan 1. Pain and swelling of left wrist Xray  neg, normal CMS ? If this is due to cellulitis associated with a skin tear to the left forearm. Will treated with keflex 500 tid x 7 days   2. Late onset Alzheimer's disease with behavioral disturbance ([place]) He is progressively weaker and requiring more assistance. He is also combative with care/hitting staff. This is likely due to progression in his dementia but there is concern that pain may play a role. Will treat #1 and re eval.  Increase Zoloft 100 mg qhs due to combativeness/anxiety with care. Staff to alter approach.   3. Idiopathic chronic gout without tophus, unspecified site Check uric acid due to left wrist swelling    Family/ staff Communication: I called and left a message with his daughter to call [facility] for an update.   Labs/tests ordered:   Uric acid stat

## 2019-02-07 DIAGNOSIS — M6389 Disorders of muscle in diseases classified elsewhere, multiple sites: Secondary | ICD-10-CM | POA: Diagnosis not present

## 2019-02-07 DIAGNOSIS — M10032 Idiopathic gout, left wrist: Secondary | ICD-10-CM | POA: Diagnosis not present

## 2019-02-07 DIAGNOSIS — E785 Hyperlipidemia, unspecified: Secondary | ICD-10-CM | POA: Diagnosis not present

## 2019-02-07 DIAGNOSIS — M25532 Pain in left wrist: Secondary | ICD-10-CM | POA: Diagnosis not present

## 2019-02-07 DIAGNOSIS — M25432 Effusion, left wrist: Secondary | ICD-10-CM | POA: Diagnosis not present

## 2019-02-07 DIAGNOSIS — G301 Alzheimer's disease with late onset: Secondary | ICD-10-CM | POA: Diagnosis not present

## 2019-02-10 DIAGNOSIS — Z9189 Other specified personal risk factors, not elsewhere classified: Secondary | ICD-10-CM | POA: Diagnosis not present

## 2019-02-11 DIAGNOSIS — M6389 Disorders of muscle in diseases classified elsewhere, multiple sites: Secondary | ICD-10-CM | POA: Diagnosis not present

## 2019-02-11 DIAGNOSIS — M25432 Effusion, left wrist: Secondary | ICD-10-CM | POA: Diagnosis not present

## 2019-02-11 DIAGNOSIS — G301 Alzheimer's disease with late onset: Secondary | ICD-10-CM | POA: Diagnosis not present

## 2019-02-11 DIAGNOSIS — M25532 Pain in left wrist: Secondary | ICD-10-CM | POA: Diagnosis not present

## 2019-02-11 LAB — NOVEL CORONAVIRUS, NAA: SARS-CoV-2, NAA: NOT DETECTED

## 2019-02-12 DIAGNOSIS — M6389 Disorders of muscle in diseases classified elsewhere, multiple sites: Secondary | ICD-10-CM | POA: Diagnosis not present

## 2019-02-12 DIAGNOSIS — G301 Alzheimer's disease with late onset: Secondary | ICD-10-CM | POA: Diagnosis not present

## 2019-02-12 DIAGNOSIS — M25532 Pain in left wrist: Secondary | ICD-10-CM | POA: Diagnosis not present

## 2019-02-12 DIAGNOSIS — M25432 Effusion, left wrist: Secondary | ICD-10-CM | POA: Diagnosis not present

## 2019-02-13 DIAGNOSIS — M6389 Disorders of muscle in diseases classified elsewhere, multiple sites: Secondary | ICD-10-CM | POA: Diagnosis not present

## 2019-02-13 DIAGNOSIS — M25532 Pain in left wrist: Secondary | ICD-10-CM | POA: Diagnosis not present

## 2019-02-13 DIAGNOSIS — G301 Alzheimer's disease with late onset: Secondary | ICD-10-CM | POA: Diagnosis not present

## 2019-02-13 DIAGNOSIS — M25432 Effusion, left wrist: Secondary | ICD-10-CM | POA: Diagnosis not present

## 2019-02-18 DIAGNOSIS — G301 Alzheimer's disease with late onset: Secondary | ICD-10-CM | POA: Diagnosis not present

## 2019-02-18 DIAGNOSIS — M6389 Disorders of muscle in diseases classified elsewhere, multiple sites: Secondary | ICD-10-CM | POA: Diagnosis not present

## 2019-02-18 DIAGNOSIS — M25432 Effusion, left wrist: Secondary | ICD-10-CM | POA: Diagnosis not present

## 2019-02-18 DIAGNOSIS — M25532 Pain in left wrist: Secondary | ICD-10-CM | POA: Diagnosis not present

## 2019-02-19 DIAGNOSIS — M25432 Effusion, left wrist: Secondary | ICD-10-CM | POA: Diagnosis not present

## 2019-02-19 DIAGNOSIS — G301 Alzheimer's disease with late onset: Secondary | ICD-10-CM | POA: Diagnosis not present

## 2019-02-19 DIAGNOSIS — M6389 Disorders of muscle in diseases classified elsewhere, multiple sites: Secondary | ICD-10-CM | POA: Diagnosis not present

## 2019-02-19 DIAGNOSIS — M25532 Pain in left wrist: Secondary | ICD-10-CM | POA: Diagnosis not present

## 2019-02-20 DIAGNOSIS — G301 Alzheimer's disease with late onset: Secondary | ICD-10-CM | POA: Diagnosis not present

## 2019-02-20 DIAGNOSIS — M25432 Effusion, left wrist: Secondary | ICD-10-CM | POA: Diagnosis not present

## 2019-02-20 DIAGNOSIS — M25532 Pain in left wrist: Secondary | ICD-10-CM | POA: Diagnosis not present

## 2019-02-20 DIAGNOSIS — M6389 Disorders of muscle in diseases classified elsewhere, multiple sites: Secondary | ICD-10-CM | POA: Diagnosis not present

## 2019-02-20 DIAGNOSIS — Z7689 Persons encountering health services in other specified circumstances: Secondary | ICD-10-CM | POA: Diagnosis not present

## 2019-02-24 DIAGNOSIS — G301 Alzheimer's disease with late onset: Secondary | ICD-10-CM | POA: Diagnosis not present

## 2019-02-24 DIAGNOSIS — M25532 Pain in left wrist: Secondary | ICD-10-CM | POA: Diagnosis not present

## 2019-02-24 DIAGNOSIS — M6389 Disorders of muscle in diseases classified elsewhere, multiple sites: Secondary | ICD-10-CM | POA: Diagnosis not present

## 2019-02-24 DIAGNOSIS — M25432 Effusion, left wrist: Secondary | ICD-10-CM | POA: Diagnosis not present

## 2019-02-25 DIAGNOSIS — M25532 Pain in left wrist: Secondary | ICD-10-CM | POA: Diagnosis not present

## 2019-02-25 DIAGNOSIS — G301 Alzheimer's disease with late onset: Secondary | ICD-10-CM | POA: Diagnosis not present

## 2019-02-25 DIAGNOSIS — M25432 Effusion, left wrist: Secondary | ICD-10-CM | POA: Diagnosis not present

## 2019-02-25 DIAGNOSIS — M6389 Disorders of muscle in diseases classified elsewhere, multiple sites: Secondary | ICD-10-CM | POA: Diagnosis not present

## 2019-02-26 ENCOUNTER — Encounter: Payer: Self-pay | Admitting: *Deleted

## 2019-02-26 DIAGNOSIS — Z9189 Other specified personal risk factors, not elsewhere classified: Secondary | ICD-10-CM | POA: Diagnosis not present

## 2019-02-27 DIAGNOSIS — M25432 Effusion, left wrist: Secondary | ICD-10-CM | POA: Diagnosis not present

## 2019-02-27 DIAGNOSIS — M25532 Pain in left wrist: Secondary | ICD-10-CM | POA: Diagnosis not present

## 2019-02-27 DIAGNOSIS — M6389 Disorders of muscle in diseases classified elsewhere, multiple sites: Secondary | ICD-10-CM | POA: Diagnosis not present

## 2019-02-27 DIAGNOSIS — G301 Alzheimer's disease with late onset: Secondary | ICD-10-CM | POA: Diagnosis not present

## 2019-03-03 DIAGNOSIS — M6389 Disorders of muscle in diseases classified elsewhere, multiple sites: Secondary | ICD-10-CM | POA: Diagnosis not present

## 2019-03-03 DIAGNOSIS — G301 Alzheimer's disease with late onset: Secondary | ICD-10-CM | POA: Diagnosis not present

## 2019-03-03 DIAGNOSIS — M25532 Pain in left wrist: Secondary | ICD-10-CM | POA: Diagnosis not present

## 2019-03-03 DIAGNOSIS — M25432 Effusion, left wrist: Secondary | ICD-10-CM | POA: Diagnosis not present

## 2019-03-03 DIAGNOSIS — Z9189 Other specified personal risk factors, not elsewhere classified: Secondary | ICD-10-CM | POA: Diagnosis not present

## 2019-03-04 DIAGNOSIS — M25532 Pain in left wrist: Secondary | ICD-10-CM | POA: Diagnosis not present

## 2019-03-04 DIAGNOSIS — M6389 Disorders of muscle in diseases classified elsewhere, multiple sites: Secondary | ICD-10-CM | POA: Diagnosis not present

## 2019-03-04 DIAGNOSIS — M25432 Effusion, left wrist: Secondary | ICD-10-CM | POA: Diagnosis not present

## 2019-03-04 DIAGNOSIS — G301 Alzheimer's disease with late onset: Secondary | ICD-10-CM | POA: Diagnosis not present

## 2019-03-06 ENCOUNTER — Non-Acute Institutional Stay: Payer: Medicare Other | Admitting: Adult Health

## 2019-03-06 ENCOUNTER — Encounter: Payer: Self-pay | Admitting: Adult Health

## 2019-03-06 DIAGNOSIS — F0281 Dementia in other diseases classified elsewhere with behavioral disturbance: Secondary | ICD-10-CM

## 2019-03-06 DIAGNOSIS — E039 Hypothyroidism, unspecified: Secondary | ICD-10-CM

## 2019-03-06 DIAGNOSIS — F329 Major depressive disorder, single episode, unspecified: Secondary | ICD-10-CM | POA: Diagnosis not present

## 2019-03-06 DIAGNOSIS — M1A00X0 Idiopathic chronic gout, unspecified site, without tophus (tophi): Secondary | ICD-10-CM

## 2019-03-06 DIAGNOSIS — C674 Malignant neoplasm of posterior wall of bladder: Secondary | ICD-10-CM

## 2019-03-06 DIAGNOSIS — F0280 Dementia in other diseases classified elsewhere without behavioral disturbance: Secondary | ICD-10-CM

## 2019-03-06 DIAGNOSIS — M25532 Pain in left wrist: Secondary | ICD-10-CM | POA: Diagnosis not present

## 2019-03-06 DIAGNOSIS — N184 Chronic kidney disease, stage 4 (severe): Secondary | ICD-10-CM | POA: Insufficient documentation

## 2019-03-06 DIAGNOSIS — M25432 Effusion, left wrist: Secondary | ICD-10-CM | POA: Diagnosis not present

## 2019-03-06 DIAGNOSIS — G301 Alzheimer's disease with late onset: Secondary | ICD-10-CM | POA: Diagnosis not present

## 2019-03-06 DIAGNOSIS — M6389 Disorders of muscle in diseases classified elsewhere, multiple sites: Secondary | ICD-10-CM | POA: Diagnosis not present

## 2019-03-06 DIAGNOSIS — F0393 Unspecified dementia, unspecified severity, with mood disturbance: Secondary | ICD-10-CM

## 2019-03-06 DIAGNOSIS — I255 Ischemic cardiomyopathy: Secondary | ICD-10-CM

## 2019-03-06 MED ORDER — ALPRAZOLAM 1 MG PO TABS: 0.5000 mg | ORAL_TABLET | Freq: Once | ORAL | 0 refills | Status: AC

## 2019-03-06 NOTE — Progress Notes (Signed)
Location:  Occupational psychologist of Service:  ALF (13) Provider:   Cindi Carbon, ANP [place] [phone]   Gayland Curry, DO  Patient Care Team: Gayland Curry, DO as PCP - General (Geriatric Medicine) Community, [facility], Denice Bors, MD as Consulting Physician (Cardiology) Jacolyn Reedy, MD as Consulting Physician (Cardiology) Tyler Pita, MD as Consulting Physician (Radiation Oncology) Allyn Kenner, MD as Consulting Physician (Dermatology) Ardis Hughs, MD as Attending Physician (Urology) Royal Hawthorn, NP as Nurse Practitioner (Nurse Practitioner)  Extended Emergency Contact Information Primary Emergency Contact: Boulos,"Wat" Tyrone Apple [place] of [place] Phone: [phone] Relation: Son Secondary Emergency Contact: Vella Raring" Address: [address]          [place], [place] [address] Johnnette Litter of [employer] Phone: [phone] Relation: Daughter  Code Status:  DNR Goals of care: Advanced Directive information Advanced Directives 07/09/2018  Does Patient Have a Medical Advance Directive? Yes  Type of Paramedic of [place];Out of facility DNR (pink MOST or yellow form)  Does patient want to make changes to medical advance directive? No - Patient declined  Copy of [place] in Chart? Yes - validated most recent copy scanned in chart (See row information)  Pre-existing out of facility DNR order (yellow form or pink MOST form) Yellow form placed in chart (order not valid for inpatient use)     Chief Complaint  Patient presents with  . Medical Management of Chronic Issues    HPI:  Pt is a 85 y.o. male seen today for medical management of chronic diseases.    AD: He has been working with OT to establish a routine that will help prevent behavioral outburst. They have identified triggers and made efforts to create a plan to  avoid agitation and it seems to be working. He has had two notes in the month of Oct in matrix that indicated agitation but overrall he has improved. One issue is that he becomes very upset with crying and screaming during any foot care and if his compression hose touch his toenails. He needs to have his toenails clipped and the staff are requesting xanax prior to his podiatry apt.   He has not shown signs of any pain and is now on scheduled tylenol for any perceived pain that he may not be able to verbalize.  He is eating less and has lost 8 lbs in the past 4 months Wt Readings from Last 3 Encounters:  03/06/19 176 lb 6.4 oz (80 kg)  12/24/18 181 lb (82.1 kg)  11/04/18 184 lb 9.6 oz (83.7 kg)  He has a noted hx of bladder ca but is no longer receiving treatment due to his goals of care.   No issues are reported with chewing or swallowing.   Past Medical History:  Diagnosis Date  . Abnormality of gait   . Allergic rhinitis due to pollen   . Arthritis   . CAD (coronary artery disease)   . Cataracts, bilateral   . Chronic kidney disease, stage II (mild)   . Dementia ([place])   . Diverticulosis   . DNR (do not resuscitate)   . Edema    wears compression hose  . GERD (gastroesophageal reflux disease)   . Glaucoma    Right eye  . Gout, unspecified   . Hernia, hiatal   . Hyperglycemia 2014  . Hyperlipemia   . Hypertension   . Hypothyroid   .  Myocardial infarction ([place])   . Prostate cancer ([place])   . Vitamin D deficiency disease    Past Surgical History:  Procedure Laterality Date  . COLONOSCOPY  07/1990, 10/1992, 12/1995 and 02/1999   Polyps removed at each procedure  . EYE SURGERY Bilateral 1999   Cataract removal  . SKIN CANCER EXCISION  2004   Ears  . TRANSURETHRAL RESECTION OF BLADDER TUMOR N/A 02/24/2016   Procedure: TRANSURETHRAL RESECTION OF BLADDER TUMOR (TURBT), BILATERAL RETROGRADE POLYGRAM;  Surgeon: Ardis Hughs, MD;  Location: [facility];  Service: Urology;   Laterality: N/A;    Allergies  Allergen Reactions  . Ibuprofen Rash    Outpatient Encounter Medications as of 03/06/2019  Medication Sig  . acetaminophen (TYLENOL) 325 MG tablet Take 650 mg by mouth 2 (two) times daily as needed.   Marland Kitchen acetaminophen (TYLENOL) 500 MG tablet Take 1,000 mg by mouth 2 (two) times daily.  Marland Kitchen allopurinol (ZYLOPRIM) 100 MG tablet TAKE 1 TABLET ONCE DAILY TO LOWER URIC ACID.  Marland Kitchen ALPRAZolam (XANAX) 0.5 MG tablet Take 0.5 tablets (0.25 mg total) by mouth 2 (two) times a week. On Wednesday and Saturday prior to showers.  Marland Kitchen atorvastatin (LIPITOR) 20 MG tablet Take 20 mg by mouth daily.  . cholecalciferol (VITAMIN D) 1000 UNITS tablet Take 2,000 Units by mouth daily.  . Emollient (EUCERIN ORIGINAL HEALING) LOTN Apply 1 application topically 2 (two) times daily.  . feeding supplement (BOOST HIGH PROTEIN) LIQD Take 1 Container by mouth daily.  Marland Kitchen latanoprost (XALATAN) 0.005 % ophthalmic solution Place 1 drop into the right eye at bedtime.  Marland Kitchen levothyroxine (SYNTHROID, LEVOTHROID) 50 MCG tablet TAKE 1 TABLET BY MOUTH DAILY FOR THYROID SUPPLEMENT  . metoprolol succinate (TOPROL XL) 25 MG 24 hr tablet Take 1 tablet (25 mg total) by mouth daily.  Marland Kitchen NAMENDA XR 28 MG CP24 24 hr capsule Take 28 mg by mouth every evening.   . nitroGLYCERIN (NITROSTAT) 0.4 MG SL tablet Place 1 tablet (0.4 mg total) under the tongue every 5 (five) minutes as needed for chest pain.  Marland Kitchen sertraline (ZOLOFT) 100 MG tablet Take 100 mg by mouth daily.  . [DISCONTINUED] sertraline (ZOLOFT) 25 MG tablet Take 75 mg by mouth at bedtime.    No facility-administered encounter medications on file as of 03/06/2019.     Review of Systems  Unable to perform ROS: Dementia    Immunization History  Administered Date(s) Administered  . Influenza Inj Mdck Quad Pf 03/02/2016  . Influenza Whole 02/13/2012  . Influenza,inj,Quad PF,6+ Mos 03/05/2018  . Influenza-Unspecified 02/18/2014, 02/25/2015, 05/18/2017  .  Pneumococcal Conjugate-13 10/06/2014  . Pneumococcal Polysaccharide-23 05/15/1998  . Tdap 11/28/2016  . Zoster 05/15/2006  . Zoster Recombinat (Shingrix) 04/10/2017, 08/07/2017   Pertinent  Health Maintenance Due  Topic Date Due  . INFLUENZA VACCINE  12/14/2018  . PNA vac Low Risk Adult  Completed   Fall Risk  11/04/2018 07/16/2018 11/28/2017 12/27/2016 11/28/2016  Falls in the past year? 1 0 No No No  Number falls in past yr: 0 0 - - -  Injury with Fall? 0 0 - - -  Comment - - - - -  Risk for fall due to : Impaired balance/gait;Impaired vision Impaired mobility;Medication side effect;Impaired balance/gait;Mental status change - - -  Follow up Falls evaluation completed Falls evaluation completed - - -   Functional Status Survey:    Vitals:   03/06/19 1624  Temp: 97.6 F (36.4 C)  Weight: 176 lb 6.4 oz (80  kg)   Body mass index is 26.05 kg/m.  Wt Readings from Last 3 Encounters:  03/06/19 176 lb 6.4 oz (80 kg)  12/24/18 181 lb (82.1 kg)  11/04/18 184 lb 9.6 oz (83.7 kg)    Physical Exam Constitutional:      General: He is not in acute distress.    Appearance: He is not diaphoretic.  HENT:     Head: Normocephalic and atraumatic.     Nose: Nose normal. No congestion.     Mouth/Throat:     Mouth: Mucous membranes are moist.     Pharynx: Oropharynx is clear. No oropharyngeal exudate.  Neck:     Musculoskeletal: Normal range of motion and neck supple.     Thyroid: No thyromegaly.     Vascular: No JVD.     Trachea: No tracheal deviation.  Cardiovascular:     Rate and Rhythm: Normal rate and regular rhythm.     Heart sounds: No murmur.  Pulmonary:     Effort: Pulmonary effort is normal. No respiratory distress.     Breath sounds: Normal breath sounds. No wheezing.  Abdominal:     General: Bowel sounds are normal. There is no distension.     Palpations: Abdomen is soft.     Tenderness: There is no abdominal tenderness.  Musculoskeletal:        General: No swelling,  tenderness, deformity or signs of injury.     Right lower leg: No edema.     Left lower leg: No edema.  Lymphadenopathy:     Cervical: No cervical adenopathy.  Skin:    General: Skin is warm and dry.  Neurological:     General: No focal deficit present.     Mental Status: He is alert and oriented to person, place, and time. Mental status is at baseline.     Cranial Nerves: No cranial nerve deficit.     Labs reviewed: Recent Labs    03/22/18 01/10/19  NA 139 144  K 4.3 4.9  BUN 32* 40*  CREATININE 1.4* 1.9*   No results for input(s): AST, ALT, ALKPHOS, BILITOT, PROT, ALBUMIN in the last 8760 hours. Recent Labs    03/22/18 01/10/19  WBC 6.1 7.5  HGB 12.1* 12.2*  HCT 35* 36*  PLT 197 214   Lab Results  Component Value Date   TSH 1.65 01/10/2019   Lab Results  Component Value Date   HGBA1C 6.1 02/14/2016   Lab Results  Component Value Date   CHOL 164 01/02/2017   HDL 53 01/02/2017   LDLCALC 80 01/02/2017   TRIG 158 01/02/2017   CHOLHDL 4.1 06/01/2012    Significant Diagnostic Results in last 30 days:  No results found.  Assessment/Plan  1. Late onset Alzheimer's disease with behavioral disturbance ([place]) May use xanax 0.5 mg x 1 prior to podiatry apt.  Improved behaviors per nursing. Showing signs of progression at this time. Continue current regimen. DNR in place. Awaiting most form return.   2. Hypothyroidism, unspecified type Lab Results  Component Value Date   TSH 1.65 01/10/2019  Continue synthroid 50 mcg qd   3. Depression due to dementia [facility]) Behaviors are improved with OT recommendations and zoloft 100 mg qd  4. CKD (chronic kidney disease) stage 4, GFR 15-29 ml/min (HCC) Continue to periodically monitor BMP and avoid nephrotoxic agents  5. Malignant neoplasm of posterior wall of urinary bladder (HCC) -no longer followed by urology due to goals of care.   6. Idiopathic chronic  gout without tophus, unspecified site No symptoms Uric acid 7.0  02/07/19 Continue allopurinol 100 mg qd  7. Cardiomyopathy, ischemic Ef 40-45% 06/02/12 Currently with no symptoms Not on asa due to hematuria.    Family/ staff Communication: resident and staff  Labs/tests ordered:  NA

## 2019-03-10 DIAGNOSIS — Z9189 Other specified personal risk factors, not elsewhere classified: Secondary | ICD-10-CM | POA: Diagnosis not present

## 2019-03-11 DIAGNOSIS — M25432 Effusion, left wrist: Secondary | ICD-10-CM | POA: Diagnosis not present

## 2019-03-11 DIAGNOSIS — M6389 Disorders of muscle in diseases classified elsewhere, multiple sites: Secondary | ICD-10-CM | POA: Diagnosis not present

## 2019-03-11 DIAGNOSIS — M25532 Pain in left wrist: Secondary | ICD-10-CM | POA: Diagnosis not present

## 2019-03-11 DIAGNOSIS — G301 Alzheimer's disease with late onset: Secondary | ICD-10-CM | POA: Diagnosis not present

## 2019-03-13 DIAGNOSIS — B351 Tinea unguium: Secondary | ICD-10-CM | POA: Diagnosis not present

## 2019-03-13 DIAGNOSIS — L84 Corns and callosities: Secondary | ICD-10-CM | POA: Diagnosis not present

## 2019-03-14 DIAGNOSIS — M6389 Disorders of muscle in diseases classified elsewhere, multiple sites: Secondary | ICD-10-CM | POA: Diagnosis not present

## 2019-03-14 DIAGNOSIS — M25432 Effusion, left wrist: Secondary | ICD-10-CM | POA: Diagnosis not present

## 2019-03-14 DIAGNOSIS — M25532 Pain in left wrist: Secondary | ICD-10-CM | POA: Diagnosis not present

## 2019-03-14 DIAGNOSIS — G301 Alzheimer's disease with late onset: Secondary | ICD-10-CM | POA: Diagnosis not present

## 2019-03-17 DIAGNOSIS — Z9189 Other specified personal risk factors, not elsewhere classified: Secondary | ICD-10-CM | POA: Diagnosis not present

## 2019-03-18 DIAGNOSIS — M6389 Disorders of muscle in diseases classified elsewhere, multiple sites: Secondary | ICD-10-CM | POA: Diagnosis not present

## 2019-03-18 DIAGNOSIS — R293 Abnormal posture: Secondary | ICD-10-CM | POA: Diagnosis not present

## 2019-03-18 DIAGNOSIS — S0003XA Contusion of scalp, initial encounter: Secondary | ICD-10-CM | POA: Diagnosis not present

## 2019-03-18 DIAGNOSIS — M25432 Effusion, left wrist: Secondary | ICD-10-CM | POA: Diagnosis not present

## 2019-03-18 DIAGNOSIS — M25532 Pain in left wrist: Secondary | ICD-10-CM | POA: Diagnosis not present

## 2019-03-18 DIAGNOSIS — G301 Alzheimer's disease with late onset: Secondary | ICD-10-CM | POA: Diagnosis not present

## 2019-03-18 DIAGNOSIS — W1809XS Striking against other object with subsequent fall, sequela: Secondary | ICD-10-CM | POA: Diagnosis not present

## 2019-03-18 DIAGNOSIS — R531 Weakness: Secondary | ICD-10-CM | POA: Diagnosis not present

## 2019-03-19 DIAGNOSIS — W1809XS Striking against other object with subsequent fall, sequela: Secondary | ICD-10-CM | POA: Diagnosis not present

## 2019-03-19 DIAGNOSIS — R531 Weakness: Secondary | ICD-10-CM | POA: Diagnosis not present

## 2019-03-19 DIAGNOSIS — M25532 Pain in left wrist: Secondary | ICD-10-CM | POA: Diagnosis not present

## 2019-03-19 DIAGNOSIS — R293 Abnormal posture: Secondary | ICD-10-CM | POA: Diagnosis not present

## 2019-03-19 DIAGNOSIS — M25432 Effusion, left wrist: Secondary | ICD-10-CM | POA: Diagnosis not present

## 2019-03-19 DIAGNOSIS — S0003XA Contusion of scalp, initial encounter: Secondary | ICD-10-CM | POA: Diagnosis not present

## 2019-03-19 DIAGNOSIS — G301 Alzheimer's disease with late onset: Secondary | ICD-10-CM | POA: Diagnosis not present

## 2019-03-19 DIAGNOSIS — M6389 Disorders of muscle in diseases classified elsewhere, multiple sites: Secondary | ICD-10-CM | POA: Diagnosis not present

## 2019-03-24 DIAGNOSIS — Z7689 Persons encountering health services in other specified circumstances: Secondary | ICD-10-CM | POA: Diagnosis not present

## 2019-03-25 DIAGNOSIS — R293 Abnormal posture: Secondary | ICD-10-CM | POA: Diagnosis not present

## 2019-03-25 DIAGNOSIS — M25432 Effusion, left wrist: Secondary | ICD-10-CM | POA: Diagnosis not present

## 2019-03-25 DIAGNOSIS — M25532 Pain in left wrist: Secondary | ICD-10-CM | POA: Diagnosis not present

## 2019-03-25 DIAGNOSIS — S0003XA Contusion of scalp, initial encounter: Secondary | ICD-10-CM | POA: Diagnosis not present

## 2019-03-25 DIAGNOSIS — W1809XS Striking against other object with subsequent fall, sequela: Secondary | ICD-10-CM | POA: Diagnosis not present

## 2019-03-25 DIAGNOSIS — M6389 Disorders of muscle in diseases classified elsewhere, multiple sites: Secondary | ICD-10-CM | POA: Diagnosis not present

## 2019-03-25 DIAGNOSIS — G301 Alzheimer's disease with late onset: Secondary | ICD-10-CM | POA: Diagnosis not present

## 2019-03-25 DIAGNOSIS — R531 Weakness: Secondary | ICD-10-CM | POA: Diagnosis not present

## 2019-03-26 DIAGNOSIS — Z9189 Other specified personal risk factors, not elsewhere classified: Secondary | ICD-10-CM | POA: Diagnosis not present

## 2019-03-26 DIAGNOSIS — N390 Urinary tract infection, site not specified: Secondary | ICD-10-CM | POA: Diagnosis not present

## 2019-03-26 DIAGNOSIS — R509 Fever, unspecified: Secondary | ICD-10-CM | POA: Diagnosis not present

## 2019-03-26 DIAGNOSIS — R319 Hematuria, unspecified: Secondary | ICD-10-CM | POA: Diagnosis not present

## 2019-03-27 ENCOUNTER — Non-Acute Institutional Stay: Payer: Medicare Other | Admitting: Adult Health

## 2019-03-27 ENCOUNTER — Encounter: Payer: Self-pay | Admitting: Adult Health

## 2019-03-27 DIAGNOSIS — M2560 Stiffness of unspecified joint, not elsewhere classified: Secondary | ICD-10-CM | POA: Diagnosis not present

## 2019-03-27 DIAGNOSIS — F0281 Dementia in other diseases classified elsewhere with behavioral disturbance: Secondary | ICD-10-CM

## 2019-03-27 DIAGNOSIS — R339 Retention of urine, unspecified: Secondary | ICD-10-CM | POA: Diagnosis not present

## 2019-03-27 DIAGNOSIS — G301 Alzheimer's disease with late onset: Secondary | ICD-10-CM

## 2019-03-27 DIAGNOSIS — K5901 Slow transit constipation: Secondary | ICD-10-CM | POA: Diagnosis not present

## 2019-03-27 NOTE — Progress Notes (Addendum)
Location:  Occupational psychologist of Service:  ALF (13) Provider:   Cindi Carbon, ANP [place] [phone]   Gayland Curry, DO  Patient Care Team: Gayland Curry, DO as PCP - General (Geriatric Medicine) Community, [facility], Denice Bors, MD as Consulting Physician (Cardiology) Jacolyn Reedy, MD as Consulting Physician (Cardiology) Tyler Pita, MD as Consulting Physician (Radiation Oncology) Allyn Kenner, MD as Consulting Physician (Dermatology) Ardis Hughs, MD as Attending Physician (Urology) Royal Hawthorn, NP as Nurse Practitioner (Nurse Practitioner)  Extended Emergency Contact Information Primary Emergency Contact: Pellegrino,"Wat" Tyrone Apple States of [place] Phone: [phone] Relation: Son Secondary Emergency Contact: Vella Raring" Address: [address]          [place], [place] [address] Johnnette Litter of [employer] Phone: [phone] Relation: Daughter  Code Status:  DNR Goals of care: Advanced Directive information Advanced Directives 07/09/2018  Does Patient Have a Medical Advance Directive? Yes  Type of Paramedic of [place];Out of facility DNR (pink MOST or yellow form)  Does patient want to make changes to medical advance directive? No - Patient declined  Copy of [place] in Chart? Yes - validated most recent copy scanned in chart (See row information)  Pre-existing out of facility DNR order (yellow form or pink MOST form) Yellow form placed in chart (order not valid for inpatient use)     Chief Complaint  Patient presents with  . Acute Visit    low grade temp worsening behaviors.     HPI:  Pt is a 85 y.o. male seen today for an acute visit for low grade temp and worsening behaviors. He resides in memory care due to progressive AD. He has become progressively weaker and resists personal care.  On 11/6 he had a temp of  99.5 and was placed on isolation. No other symptoms noted. On 11/7 and 11/8 he continued with low grade temps of 100.3 and 99.7.  Normal temps 11/9-11/11.  As of 03/27/2019 his temp is 97.9. Still no other symptoms such as cough, sore throat, or shortness of breath. He had some bladder pain on 11/10 and a urine was ordered which grew 30,000 colonies of mixed flora. He denies any bladder pain today, as well as back pain, frequency, or other urinary symptoms. He is in good spirits and compliant for the exam.   I asked the nurse to scan his bladder and found that he has a PVR of 475.    He also has some issues with constipation and took MOM today but no result yet.   The nurse also reports that he is very stiff in his upper and lower body in the morning and it takes him an hr or two to improve. He looks uncomfortable in the morning and seems to improve with his scheduled tylenol and time.   He has had one covid test return negative   Past Medical History:  Diagnosis Date  . Abnormality of gait   . Allergic rhinitis due to pollen   . Arthritis   . CAD (coronary artery disease)   . Cataracts, bilateral   . Chronic kidney disease, stage II (mild)   . Dementia ([place])   . Diverticulosis   . DNR (do not resuscitate)   . Edema    wears compression hose  . GERD (gastroesophageal reflux disease)   . Glaucoma    Right eye  . Gout, unspecified   .  Hernia, hiatal   . Hyperglycemia 2014  . Hyperlipemia   . Hypertension   . Hypothyroid   . Myocardial infarction ([place])   . Prostate cancer ([place])   . Vitamin D deficiency disease    Past Surgical History:  Procedure Laterality Date  . COLONOSCOPY  07/1990, 10/1992, 12/1995 and 02/1999   Polyps removed at each procedure  . EYE SURGERY Bilateral 1999   Cataract removal  . SKIN CANCER EXCISION  2004   Ears  . TRANSURETHRAL RESECTION OF BLADDER TUMOR N/A 02/24/2016   Procedure: TRANSURETHRAL RESECTION OF BLADDER TUMOR (TURBT), BILATERAL RETROGRADE  POLYGRAM;  Surgeon: Ardis Hughs, MD;  Location: [facility];  Service: Urology;  Laterality: N/A;    Allergies  Allergen Reactions  . Ibuprofen Rash    Outpatient Encounter Medications as of 03/27/2019  Medication Sig  . acetaminophen (TYLENOL) 325 MG tablet Take 650 mg by mouth 2 (two) times daily as needed.   Marland Kitchen acetaminophen (TYLENOL) 500 MG tablet Take 1,000 mg by mouth 2 (two) times daily.  Marland Kitchen allopurinol (ZYLOPRIM) 100 MG tablet TAKE 1 TABLET ONCE DAILY TO LOWER URIC ACID.  Marland Kitchen ALPRAZolam (XANAX) 0.5 MG tablet Take 0.5 tablets (0.25 mg total) by mouth 2 (two) times a week. On Wednesday and Saturday prior to showers.  Marland Kitchen atorvastatin (LIPITOR) 20 MG tablet Take 20 mg by mouth daily.  . cholecalciferol (VITAMIN D) 1000 UNITS tablet Take 2,000 Units by mouth daily.  . Emollient (EUCERIN ORIGINAL HEALING) LOTN Apply 1 application topically 2 (two) times daily.  . feeding supplement (BOOST HIGH PROTEIN) LIQD Take 1 Container by mouth daily.  Marland Kitchen latanoprost (XALATAN) 0.005 % ophthalmic solution Place 1 drop into the right eye at bedtime.  Marland Kitchen levothyroxine (SYNTHROID, LEVOTHROID) 50 MCG tablet TAKE 1 TABLET BY MOUTH DAILY FOR THYROID SUPPLEMENT  . metoprolol succinate (TOPROL XL) 25 MG 24 hr tablet Take 1 tablet (25 mg total) by mouth daily.  Marland Kitchen NAMENDA XR 28 MG CP24 24 hr capsule Take 28 mg by mouth every evening.   . nitroGLYCERIN (NITROSTAT) 0.4 MG SL tablet Place 1 tablet (0.4 mg total) under the tongue every 5 (five) minutes as needed for chest pain.  Marland Kitchen sertraline (ZOLOFT) 100 MG tablet Take 100 mg by mouth daily.   No facility-administered encounter medications on file as of 03/27/2019.     Review of Systems  Unable to perform ROS: Dementia    Immunization History  Administered Date(s) Administered  . Influenza Inj Mdck Quad Pf 03/02/2016  . Influenza Whole 02/13/2012  . Influenza, High Dose Seasonal PF 03/13/2019  . Influenza,inj,Quad PF,6+ Mos 03/05/2018  .  Influenza-Unspecified 02/18/2014, 02/25/2015, 05/18/2017  . Pneumococcal Conjugate-13 10/06/2014  . Pneumococcal Polysaccharide-23 05/15/1998  . Tdap 11/28/2016  . Zoster 05/15/2006  . Zoster Recombinat (Shingrix) 04/10/2017, 08/07/2017   Pertinent  Health Maintenance Due  Topic Date Due  . INFLUENZA VACCINE  Completed  . PNA vac Low Risk Adult  Completed   Fall Risk  11/04/2018 07/16/2018 11/28/2017 12/27/2016 11/28/2016  Falls in the past year? 1 0 No No No  Number falls in past yr: 0 0 - - -  Injury with Fall? 0 0 - - -  Comment - - - - -  Risk for fall due to : Impaired balance/gait;Impaired vision Impaired mobility;Medication side effect;Impaired balance/gait;Mental status change - - -  Follow up Falls evaluation completed Falls evaluation completed - - -   Functional Status Survey:    Vitals:   03/27/19 1109  BP: 130/86  Pulse: 78  Resp: 20  Temp: 97.9 F (36.6 C)   There is no height or weight on file to calculate BMI. Physical Exam Vitals signs and nursing note reviewed.  Constitutional:      General: He is not in acute distress.    Appearance: He is not diaphoretic.  HENT:     Head: Normocephalic and atraumatic.     Nose: Nose normal. No congestion.     Mouth/Throat:     Mouth: Mucous membranes are moist.     Pharynx: Oropharynx is clear.  Neck:     Musculoskeletal: No muscular tenderness.     Thyroid: No thyromegaly.     Vascular: No carotid bruit or JVD.     Trachea: No tracheal deviation.  Cardiovascular:     Rate and Rhythm: Normal rate and regular rhythm.     Heart sounds: No murmur.  Pulmonary:     Effort: Pulmonary effort is normal. No respiratory distress.     Breath sounds: Normal breath sounds. No wheezing.  Abdominal:     General: Bowel sounds are normal. There is no distension.     Palpations: Abdomen is soft.     Tenderness: There is no abdominal tenderness. There is no right CVA tenderness or left CVA tenderness.  Musculoskeletal:     Right  lower leg: No edema.     Left lower leg: No edema.  Lymphadenopathy:     Cervical: No cervical adenopathy.  Skin:    General: Skin is warm and dry.  Neurological:     Mental Status: He is alert and oriented to person, place, and time.     Cranial Nerves: No cranial nerve deficit.  Psychiatric:        Mood and Affect: Mood normal.     Labs reviewed: Recent Labs    01/10/19  NA 144  K 4.9  BUN 40*  CREATININE 1.9*   No results for input(s): AST, ALT, ALKPHOS, BILITOT, PROT, ALBUMIN in the last 8760 hours. Recent Labs    01/10/19  WBC 7.5  HGB 12.2*  HCT 36*  PLT 214   Lab Results  Component Value Date   TSH 1.65 01/10/2019   Lab Results  Component Value Date   HGBA1C 6.1 02/14/2016   Lab Results  Component Value Date   CHOL 164 01/02/2017   HDL 53 01/02/2017   LDLCALC 80 01/02/2017   TRIG 158 01/02/2017   CHOLHDL 4.1 06/01/2012    Significant Diagnostic Results in last 30 days:  No results found.  Assessment/Plan  1. Morning stiffness of joints with associated discomfort and low grade intermittent temp Check labs for inflammatory condition such as PMR Continue scheduled Tylenol for pain Difficult to assess as he can not provide a hx  2. Slow transit constipation Dulcolax supp if no bm with MOM Senokots 2 qhs  3. Late onset Alzheimer's disease with behavioral disturbance ([place]) Progressive weakness and increased behaviors associated with personal care which are c/w with the disease. I have tried to address any discomfort issues he has by checking a post void residual, treating constipation, and having the staff try different caregivers and approach.    4. Urinary retention Likely due to bph He does have a hx of bladder and prostate cancer but is no longer going to urology due to his age and dementia Begin flomax 0.4 mg qhs  Place foley cath x 48 hrs then remove and cath q 8 hrs prn.  Scan  bladder q shift for 72 hrs after catheter removal and drain if  residual >/=250 cc    Family/ staff Communication: resident and staff   Labs/tests ordered:  ESR. CBC, and CRP

## 2019-03-28 DIAGNOSIS — D649 Anemia, unspecified: Secondary | ICD-10-CM | POA: Diagnosis not present

## 2019-03-28 DIAGNOSIS — M2560 Stiffness of unspecified joint, not elsewhere classified: Secondary | ICD-10-CM | POA: Diagnosis not present

## 2019-03-28 DIAGNOSIS — E785 Hyperlipidemia, unspecified: Secondary | ICD-10-CM | POA: Diagnosis not present

## 2019-03-28 DIAGNOSIS — M2550 Pain in unspecified joint: Secondary | ICD-10-CM | POA: Diagnosis not present

## 2019-03-28 LAB — CBC AND DIFFERENTIAL
HCT: 30 — AB (ref 41–53)
Hemoglobin: 10.2 — AB (ref 13.5–17.5)
Platelets: 265 (ref 150–399)
WBC: 9.6

## 2019-03-28 LAB — CBC: RBC: 2.92 — AB (ref 3.87–5.11)

## 2019-03-28 LAB — POCT ERYTHROCYTE SEDIMENTATION RATE, NON-AUTOMATED: Sed Rate: 120

## 2019-03-29 DIAGNOSIS — M2560 Stiffness of unspecified joint, not elsewhere classified: Secondary | ICD-10-CM | POA: Diagnosis not present

## 2019-03-31 DIAGNOSIS — E7800 Pure hypercholesterolemia, unspecified: Secondary | ICD-10-CM | POA: Diagnosis not present

## 2019-03-31 DIAGNOSIS — I482 Chronic atrial fibrillation, unspecified: Secondary | ICD-10-CM | POA: Diagnosis not present

## 2019-04-01 DIAGNOSIS — Z9189 Other specified personal risk factors, not elsewhere classified: Secondary | ICD-10-CM | POA: Diagnosis not present

## 2019-04-01 LAB — NOVEL CORONAVIRUS, NAA: SARS-CoV-2, NAA: NOT DETECTED

## 2019-04-02 DIAGNOSIS — R509 Fever, unspecified: Secondary | ICD-10-CM | POA: Diagnosis not present

## 2019-04-02 DIAGNOSIS — W1809XS Striking against other object with subsequent fall, sequela: Secondary | ICD-10-CM | POA: Diagnosis not present

## 2019-04-02 DIAGNOSIS — R5381 Other malaise: Secondary | ICD-10-CM | POA: Diagnosis not present

## 2019-04-02 DIAGNOSIS — E7800 Pure hypercholesterolemia, unspecified: Secondary | ICD-10-CM | POA: Diagnosis not present

## 2019-04-02 DIAGNOSIS — S0003XA Contusion of scalp, initial encounter: Secondary | ICD-10-CM | POA: Diagnosis not present

## 2019-04-02 DIAGNOSIS — R293 Abnormal posture: Secondary | ICD-10-CM | POA: Diagnosis not present

## 2019-04-02 DIAGNOSIS — M25432 Effusion, left wrist: Secondary | ICD-10-CM | POA: Diagnosis not present

## 2019-04-02 DIAGNOSIS — R531 Weakness: Secondary | ICD-10-CM | POA: Diagnosis not present

## 2019-04-02 DIAGNOSIS — G301 Alzheimer's disease with late onset: Secondary | ICD-10-CM | POA: Diagnosis not present

## 2019-04-02 DIAGNOSIS — M6389 Disorders of muscle in diseases classified elsewhere, multiple sites: Secondary | ICD-10-CM | POA: Diagnosis not present

## 2019-04-02 DIAGNOSIS — M25532 Pain in left wrist: Secondary | ICD-10-CM | POA: Diagnosis not present

## 2019-04-03 DIAGNOSIS — S0003XA Contusion of scalp, initial encounter: Secondary | ICD-10-CM | POA: Diagnosis not present

## 2019-04-03 DIAGNOSIS — G301 Alzheimer's disease with late onset: Secondary | ICD-10-CM | POA: Diagnosis not present

## 2019-04-03 DIAGNOSIS — M25432 Effusion, left wrist: Secondary | ICD-10-CM | POA: Diagnosis not present

## 2019-04-03 DIAGNOSIS — W1809XS Striking against other object with subsequent fall, sequela: Secondary | ICD-10-CM | POA: Diagnosis not present

## 2019-04-03 DIAGNOSIS — M6389 Disorders of muscle in diseases classified elsewhere, multiple sites: Secondary | ICD-10-CM | POA: Diagnosis not present

## 2019-04-03 DIAGNOSIS — M25532 Pain in left wrist: Secondary | ICD-10-CM | POA: Diagnosis not present

## 2019-04-03 DIAGNOSIS — R293 Abnormal posture: Secondary | ICD-10-CM | POA: Diagnosis not present

## 2019-04-03 DIAGNOSIS — R531 Weakness: Secondary | ICD-10-CM | POA: Diagnosis not present

## 2019-04-04 ENCOUNTER — Non-Acute Institutional Stay: Payer: Self-pay | Admitting: Adult Health

## 2019-04-04 ENCOUNTER — Encounter: Payer: Self-pay | Admitting: Adult Health

## 2019-04-04 ENCOUNTER — Other Ambulatory Visit: Payer: Medicare Other | Admitting: Adult Health

## 2019-04-04 DIAGNOSIS — M353 Polymyalgia rheumatica: Secondary | ICD-10-CM | POA: Diagnosis not present

## 2019-04-04 DIAGNOSIS — G301 Alzheimer's disease with late onset: Secondary | ICD-10-CM | POA: Diagnosis not present

## 2019-04-04 DIAGNOSIS — F0281 Dementia in other diseases classified elsewhere with behavioral disturbance: Secondary | ICD-10-CM | POA: Diagnosis not present

## 2019-04-04 DIAGNOSIS — F02818 Dementia in other diseases classified elsewhere, unspecified severity, with other behavioral disturbance: Secondary | ICD-10-CM

## 2019-04-04 MED ORDER — TRAMADOL HCL 50 MG PO TABS: 25.0000 mg | ORAL_TABLET | Freq: Two times a day (BID) | ORAL | 0 refills | Status: DC

## 2019-04-04 NOTE — Progress Notes (Signed)
Location:  Occupational psychologist of Service:  ALF (13) Provider:   Cindi Carbon, ANP [place] [phone]   Gayland Curry, DO  Patient Care Team: Gayland Curry, DO as PCP - General (Geriatric Medicine) Community, [facility], Denice Bors, MD as Consulting Physician (Cardiology) Jacolyn Reedy, MD as Consulting Physician (Cardiology) Tyler Pita, MD as Consulting Physician (Radiation Oncology) Allyn Kenner, MD as Consulting Physician (Dermatology) Ardis Hughs, MD as Attending Physician (Urology) Royal Hawthorn, NP as Nurse Practitioner (Nurse Practitioner)  Extended Emergency Contact Information Primary Emergency Contact: [place],"Wat" Tyrone Apple [place] of [place] Phone: [phone] Relation: Son Secondary Emergency Contact: Vella Raring" Address: [address]          [place],  [address] Johnnette Litter of [employer] Phone: [phone] Relation: Daughter  Code Status:  DNR Goals of care: Advanced Directive information Advanced Directives 07/09/2018  Does Patient Have a Medical Advance Directive? Yes  Type of Paramedic of [place];Out of facility DNR (pink MOST or yellow form)  Does patient want to make changes to medical advance directive? No - Patient declined  Copy of [place] in Chart? Yes - validated most recent copy scanned in chart (See row information)  Pre-existing out of facility DNR order (yellow form or pink MOST form) Yellow form placed in chart (order not valid for inpatient use)     Chief Complaint  Patient presents with  . Acute Visit    pain and yelling during care    HPI:  Pt is a 85 y.o. male seen today for an acute visit for pain and yelling during care.  He was experiencing morning joint stiffness, low grade fever, and appeared to be in pain. Labs were ordered which showed the following: Uric acid 03/28/19  7.3 CRP 10.78 03/28/19, repeat analysis 11/18 >31.47 Started on prednisone 20 mg qd 04/02/19 for possible PMR.  He has received 3 doses and is more agitated. He has a hx of underlying AD and has been progressively more confused, weaker, and requiring more assistance. Historically he has yelled out during personal care. Over time this has gotten worse, now he is more aggressive with hitting and kicking with care despite redirection and non pharm measures. The nurse reports since starting the prednisone he is much more agitated and appears to be in pain.  He denied pain at first during my visit and then later said yes to pain but he was not able to elaborate further. He is now on a lift.  Past Medical History:  Diagnosis Date  . Abnormality of gait   . Allergic rhinitis due to pollen   . Arthritis   . CAD (coronary artery disease)   . Cataracts, bilateral   . Chronic kidney disease, stage II (mild)   . Dementia ([place])   . Diverticulosis   . DNR (do not resuscitate)   . Edema    wears compression hose  . GERD (gastroesophageal reflux disease)   . Glaucoma    Right eye  . Gout, unspecified   . Hernia, hiatal   . Hyperglycemia 2014  . Hyperlipemia   . Hypertension   . Hypothyroid   . Myocardial infarction ([place])   . Prostate cancer ([place])   . Vitamin D deficiency disease    Past Surgical History:  Procedure Laterality Date  . COLONOSCOPY  07/1990, 10/1992, 12/1995 and 02/1999   Polyps removed at each procedure  .  EYE SURGERY Bilateral 1999   Cataract removal  . SKIN CANCER EXCISION  2004   Ears  . TRANSURETHRAL RESECTION OF BLADDER TUMOR N/A 02/24/2016   Procedure: TRANSURETHRAL RESECTION OF BLADDER TUMOR (TURBT), BILATERAL RETROGRADE POLYGRAM;  Surgeon: Ardis Hughs, MD;  Location: [facility];  Service: Urology;  Laterality: N/A;    Allergies  Allergen Reactions  . Ibuprofen Rash    Outpatient Encounter Medications as of 04/04/2019  Medication Sig  . predniSONE (DELTASONE) 20  MG tablet Take 20 mg by mouth daily with breakfast.  . senna-docusate (SENOKOT-S) 8.6-50 MG tablet Take 2 tablets by mouth at bedtime.  . tamsulosin (FLOMAX) 0.4 MG CAPS capsule Take 0.4 mg by mouth at bedtime.  Marland Kitchen acetaminophen (TYLENOL) 325 MG tablet Take 650 mg by mouth 2 (two) times daily as needed.   Marland Kitchen acetaminophen (TYLENOL) 500 MG tablet Take 1,000 mg by mouth 2 (two) times daily.  Marland Kitchen allopurinol (ZYLOPRIM) 100 MG tablet TAKE 1 TABLET ONCE DAILY TO LOWER URIC ACID.  Marland Kitchen ALPRAZolam (XANAX) 0.5 MG tablet Take 0.5 tablets (0.25 mg total) by mouth 2 (two) times a week. On Wednesday and Saturday prior to showers.  Marland Kitchen atorvastatin (LIPITOR) 20 MG tablet Take 20 mg by mouth daily.  . cholecalciferol (VITAMIN D) 1000 UNITS tablet Take 2,000 Units by mouth daily.  . Emollient (EUCERIN ORIGINAL HEALING) LOTN Apply 1 application topically 2 (two) times daily.  . feeding supplement (BOOST HIGH PROTEIN) LIQD Take 1 Container by mouth daily.  Marland Kitchen latanoprost (XALATAN) 0.005 % ophthalmic solution Place 1 drop into the right eye at bedtime.  Marland Kitchen levothyroxine (SYNTHROID, LEVOTHROID) 50 MCG tablet TAKE 1 TABLET BY MOUTH DAILY FOR THYROID SUPPLEMENT  . metoprolol succinate (TOPROL XL) 25 MG 24 hr tablet Take 1 tablet (25 mg total) by mouth daily.  Marland Kitchen NAMENDA XR 28 MG CP24 24 hr capsule Take 28 mg by mouth every evening.   . nitroGLYCERIN (NITROSTAT) 0.4 MG SL tablet Place 1 tablet (0.4 mg total) under the tongue every 5 (five) minutes as needed for chest pain.  Marland Kitchen sertraline (ZOLOFT) 100 MG tablet Take 100 mg by mouth daily.   No facility-administered encounter medications on file as of 04/04/2019.     Review of Systems  Constitutional: Positive for activity change, fatigue and fever. Negative for appetite change, chills, diaphoresis and unexpected weight change.  Respiratory: Negative for cough, shortness of breath, wheezing and stridor.   Cardiovascular: Negative for chest pain, palpitations and leg swelling.   Gastrointestinal: Negative for abdominal distention, abdominal pain, constipation and diarrhea.  Genitourinary: Negative for difficulty urinating and dysuria.  Musculoskeletal: Positive for arthralgias and gait problem. Negative for back pain, joint swelling and myalgias.  Neurological: Positive for weakness. Negative for dizziness, seizures, syncope, facial asymmetry, speech difficulty and headaches.  Hematological: Negative for adenopathy. Does not bruise/bleed easily.  Psychiatric/Behavioral: Positive for agitation, behavioral problems and confusion.    Immunization History  Administered Date(s) Administered  . Influenza Inj Mdck Quad Pf 03/02/2016  . Influenza Whole 02/13/2012  . Influenza, High Dose Seasonal PF 03/13/2019  . Influenza,inj,Quad PF,6+ Mos 03/05/2018  . Influenza-Unspecified 02/18/2014, 02/25/2015, 05/18/2017  . Pneumococcal Conjugate-13 10/06/2014  . Pneumococcal Polysaccharide-23 05/15/1998  . Tdap 11/28/2016  . Zoster 05/15/2006  . Zoster Recombinat (Shingrix) 04/10/2017, 08/07/2017   Pertinent  Health Maintenance Due  Topic Date Due  . INFLUENZA VACCINE  Completed  . PNA vac Low Risk Adult  Completed   Fall Risk  11/04/2018 07/16/2018 11/28/2017 12/27/2016 11/28/2016  Falls in the past year? 1 0 No No No  Number falls in past yr: 0 0 - - -  Injury with Fall? 0 0 - - -  Comment - - - - -  Risk for fall due to : Impaired balance/gait;Impaired vision Impaired mobility;Medication side effect;Impaired balance/gait;Mental status change - - -  Follow up Falls evaluation completed Falls evaluation completed - - -   Functional Status Survey:    There were no vitals filed for this visit. There is no height or weight on file to calculate BMI. Physical Exam Vitals signs and nursing note reviewed.  Constitutional:      General: He is not in acute distress.    Appearance: He is not diaphoretic.  HENT:     Head: Normocephalic and atraumatic.     Nose: Nose normal.      Mouth/Throat:     Mouth: Mucous membranes are moist.     Pharynx: Oropharynx is clear.  Eyes:     Conjunctiva/sclera: Conjunctivae normal.     Pupils: Pupils are equal, round, and reactive to light.  Neck:     Thyroid: No thyromegaly.     Vascular: No JVD.     Trachea: No tracheal deviation.  Cardiovascular:     Rate and Rhythm: Normal rate and regular rhythm.     Heart sounds: No murmur.  Pulmonary:     Effort: Pulmonary effort is normal. No respiratory distress.     Breath sounds: Normal breath sounds. No wheezing.  Abdominal:     General: Bowel sounds are normal. There is no distension.     Palpations: Abdomen is soft.     Tenderness: There is no abdominal tenderness.  Musculoskeletal:        General: No swelling, tenderness, deformity or signs of injury.     Right lower leg: No edema.     Left lower leg: No edema.  Lymphadenopathy:     Cervical: No cervical adenopathy.  Skin:    General: Skin is warm and dry.  Neurological:     General: No focal deficit present.     Mental Status: He is alert. Mental status is at baseline.     Labs reviewed: Recent Labs    01/10/19  NA 144  K 4.9  BUN 40*  CREATININE 1.9*   No results for input(s): AST, ALT, ALKPHOS, BILITOT, PROT, ALBUMIN in the last 8760 hours. Recent Labs    01/10/19 03/28/19  WBC 7.5 9.6  HGB 12.2* 10.2*  HCT 36* 30*  PLT 214 265   Lab Results  Component Value Date   TSH 1.65 01/10/2019   Lab Results  Component Value Date   HGBA1C 6.1 02/14/2016   Lab Results  Component Value Date   CHOL 164 01/02/2017   HDL 53 01/02/2017   LDLCALC 80 01/02/2017   TRIG 158 01/02/2017   CHOLHDL 4.1 06/01/2012    Significant Diagnostic Results in last 30 days:  No results found.  Assessment/Plan 1. Polymyalgia rheumatica ([place]) This is a possible dx and difficult to ascertain given his dementia. We will continue the prednisone and give it more time to work. Slowly taper in the future depending on his  progress.  Continue prednisone 20 mg qd Add Ultram 25 mg bid x 7 days and follow up next week for improvement.   2. Late onset Alzheimer's disease with behavioral disturbance ([place]) He is progressively worse over time. We hope that by treating his pain his behavior issues will  improve but this may part of the course of his illness. I spoke with his daughter Seward Speck today. We have been trying to get a most form  completed and I let her know the staff would provide her brother one today at his visit. She was leaning towards no hospitalizations but continued treatment in the facility for reversible issues. She will discuss this with her brother and get back with [place].    Family/ staff Communication: spoke with his daughter Seward Speck  Labs/tests ordered:  NA

## 2019-04-07 ENCOUNTER — Non-Acute Institutional Stay: Payer: Medicare Other | Admitting: Adult Health

## 2019-04-07 ENCOUNTER — Encounter: Payer: Self-pay | Admitting: Adult Health

## 2019-04-07 DIAGNOSIS — F02818 Dementia in other diseases classified elsewhere, unspecified severity, with other behavioral disturbance: Secondary | ICD-10-CM

## 2019-04-07 DIAGNOSIS — G301 Alzheimer's disease with late onset: Secondary | ICD-10-CM | POA: Diagnosis not present

## 2019-04-07 DIAGNOSIS — S0003XA Contusion of scalp, initial encounter: Secondary | ICD-10-CM | POA: Diagnosis not present

## 2019-04-07 DIAGNOSIS — N184 Chronic kidney disease, stage 4 (severe): Secondary | ICD-10-CM | POA: Diagnosis not present

## 2019-04-07 DIAGNOSIS — M353 Polymyalgia rheumatica: Secondary | ICD-10-CM

## 2019-04-07 DIAGNOSIS — E860 Dehydration: Secondary | ICD-10-CM

## 2019-04-07 DIAGNOSIS — D649 Anemia, unspecified: Secondary | ICD-10-CM | POA: Diagnosis not present

## 2019-04-07 DIAGNOSIS — M25532 Pain in left wrist: Secondary | ICD-10-CM | POA: Diagnosis not present

## 2019-04-07 DIAGNOSIS — M10341 Gout due to renal impairment, right hand: Secondary | ICD-10-CM | POA: Diagnosis not present

## 2019-04-07 DIAGNOSIS — R531 Weakness: Secondary | ICD-10-CM | POA: Diagnosis not present

## 2019-04-07 DIAGNOSIS — W1809XS Striking against other object with subsequent fall, sequela: Secondary | ICD-10-CM | POA: Diagnosis not present

## 2019-04-07 DIAGNOSIS — R293 Abnormal posture: Secondary | ICD-10-CM | POA: Diagnosis not present

## 2019-04-07 DIAGNOSIS — M79641 Pain in right hand: Secondary | ICD-10-CM | POA: Diagnosis not present

## 2019-04-07 DIAGNOSIS — F0281 Dementia in other diseases classified elsewhere with behavioral disturbance: Secondary | ICD-10-CM

## 2019-04-07 DIAGNOSIS — M25432 Effusion, left wrist: Secondary | ICD-10-CM | POA: Diagnosis not present

## 2019-04-07 DIAGNOSIS — R0602 Shortness of breath: Secondary | ICD-10-CM | POA: Diagnosis not present

## 2019-04-07 DIAGNOSIS — M6389 Disorders of muscle in diseases classified elsewhere, multiple sites: Secondary | ICD-10-CM | POA: Diagnosis not present

## 2019-04-07 MED ORDER — TRAMADOL HCL 50 MG PO TABS: 50.0000 mg | ORAL_TABLET | Freq: Four times a day (QID) | ORAL | 0 refills | Status: AC | PRN

## 2019-04-07 MED ORDER — TRAMADOL HCL 50 MG PO TABS: 50.0000 mg | ORAL_TABLET | Freq: Two times a day (BID) | ORAL | 0 refills | Status: AC

## 2019-04-07 NOTE — ACP (Advance Care Planning) (Addendum)
I called and spoke with his daughter Seward Speck. We discussed that he is dehydrated and likely needs an IV but given his dementia with combative behavior this would not be appropriate. He continues to hit and kick during personal care and would likely pull it out. She agreed. We also agreed that he would pull out a tube for feeding and his advanced directives indicate he would not want one. If he has an infection that is easily reversible she would like it treated. His goals of care at this point are comfort based. We will try to increase his medication for pain and provide him with a soft mattress overlay. His family will come visit tomorrow.

## 2019-04-07 NOTE — Progress Notes (Addendum)
Location:  Occupational psychologist of Service:  ALF (13) Provider:   Cindi Carbon, ANP [place] [phone]   Gayland Curry, DO  Patient Care Team: Gayland Curry, DO as PCP - General (Geriatric Medicine) [facility], Denice Bors, MD as Consulting Physician (Cardiology) Jacolyn Reedy, MD as Consulting Physician (Cardiology) Tyler Pita, MD as Consulting Physician (Radiation Oncology) Allyn Kenner, MD as Consulting Physician (Dermatology) Ardis Hughs, MD as Attending Physician (Urology) Royal Hawthorn, NP as Nurse Practitioner (Nurse Practitioner)  Extended Emergency Contact Information Primary Emergency Contact: [place],"Wat" Tyrone Apple [place] of [place] Phone: [phone] Relation: Son Secondary Emergency Contact: Vella Raring" Address: [address]          [place], [place] [address] [place] Litter of [employer] Phone: [phone] Relation: Daughter  Code Status:  DNR Goals of care: Advanced Directive information Advanced Directives 07/09/2018  Does Patient Have a Medical Advance Directive? Yes  Type of Paramedic of [place];Out of facility DNR (pink MOST or yellow form)  Does patient want to make changes to medical advance directive? No - Patient declined  Copy of [place] in Chart? Yes - validated most recent copy scanned in chart (See row information)  Pre-existing out of facility DNR order (yellow form or pink MOST form) Yellow form placed in chart (order not valid for inpatient use)     Chief Complaint  Patient presents with  . Acute Visit    dehydration right hand swelling sob    HPI:  Pt is a 85 y.o. male seen today for an acute visit for not eating, pain, right hand swelling, and shortness of breath.  Started on prednisone 20 mg qd 04/02/19 for possible PMR due to weakness, joint stiffness and pain with low grade  fevers.  Uric acid 03/28/19 7.3 CRP 10.78 03/28/19, repeat analysis 11/18 >31.47 ANA screen positive, titer 1:4, speckled pattern, RF factor 21, CCP Antibody <16  No improvement thus far in symptoms after starting prednisone. He has not eaten since Friday per the nurse. He as started on ultram for joint pain which has not helped and has not caused excessive sedation.   This morning he developed right hand pain, redness, tenderness, and swelling.  His heels are red and tender bilaterally per nurse.  No fever since 11/19.    He said he was dying and reported shortness of breath. Sats were normal but the nurse applied oxygen for comfort. He is having small wet briefs. Covid swab was ordered which was due for routine screening anyway. No cough or other symptoms.   Most form was returned ( a copy but not the original) indicating no hospitalizations but treatment with antibiotics and IVF as needed. Also tube feeding for a defined trial period.    Past Medical History:  Diagnosis Date  . Abnormality of gait   . Allergic rhinitis due to pollen   . Arthritis   . CAD (coronary artery disease)   . Cataracts, bilateral   . Chronic kidney disease, stage II (mild)   . Dementia ([place])   . Diverticulosis   . DNR (do not resuscitate)   . Edema    wears compression hose  . GERD (gastroesophageal reflux disease)   . Glaucoma    Right eye  . Gout, unspecified   . Hernia, hiatal   . Hyperglycemia 2014  . Hyperlipemia   . Hypertension   . Hypothyroid   .  Myocardial infarction ([place])   . Prostate cancer ([place])   . Vitamin D deficiency disease    Past Surgical History:  Procedure Laterality Date  . COLONOSCOPY  07/1990, 10/1992, 12/1995 and 02/1999   Polyps removed at each procedure  . EYE SURGERY Bilateral 1999   Cataract removal  . SKIN CANCER EXCISION  2004   Ears  . TRANSURETHRAL RESECTION OF BLADDER TUMOR N/A 02/24/2016   Procedure: TRANSURETHRAL RESECTION OF BLADDER TUMOR (TURBT), BILATERAL  RETROGRADE POLYGRAM;  Surgeon: Ardis Hughs, MD;  Location: [facility];  Service: Urology;  Laterality: N/A;    Allergies  Allergen Reactions  . Ibuprofen Rash    Outpatient Encounter Medications as of 04/07/2019  Medication Sig  . acetaminophen (TYLENOL) 325 MG tablet Take 650 mg by mouth 2 (two) times daily as needed.   Marland Kitchen acetaminophen (TYLENOL) 500 MG tablet Take 1,000 mg by mouth 2 (two) times daily.  Marland Kitchen allopurinol (ZYLOPRIM) 100 MG tablet TAKE 1 TABLET ONCE DAILY TO LOWER URIC ACID.  Marland Kitchen ALPRAZolam (XANAX) 0.5 MG tablet Take 0.5 tablets (0.25 mg total) by mouth 2 (two) times a week. On Wednesday and Saturday prior to showers.  Marland Kitchen atorvastatin (LIPITOR) 20 MG tablet Take 20 mg by mouth daily.  . cholecalciferol (VITAMIN D) 1000 UNITS tablet Take 2,000 Units by mouth daily.  . Emollient (EUCERIN ORIGINAL HEALING) LOTN Apply 1 application topically 2 (two) times daily.  . feeding supplement (BOOST HIGH PROTEIN) LIQD Take 1 Container by mouth daily.  Marland Kitchen latanoprost (XALATAN) 0.005 % ophthalmic solution Place 1 drop into the right eye at bedtime.  Marland Kitchen levothyroxine (SYNTHROID, LEVOTHROID) 50 MCG tablet TAKE 1 TABLET BY MOUTH DAILY FOR THYROID SUPPLEMENT  . metoprolol succinate (TOPROL XL) 25 MG 24 hr tablet Take 1 tablet (25 mg total) by mouth daily.  Marland Kitchen NAMENDA XR 28 MG CP24 24 hr capsule Take 28 mg by mouth every evening.   . nitroGLYCERIN (NITROSTAT) 0.4 MG SL tablet Place 1 tablet (0.4 mg total) under the tongue every 5 (five) minutes as needed for chest pain.  . predniSONE (DELTASONE) 20 MG tablet Take 20 mg by mouth daily with breakfast.  . senna-docusate (SENOKOT-S) 8.6-50 MG tablet Take 2 tablets by mouth at bedtime.  . sertraline (ZOLOFT) 100 MG tablet Take 100 mg by mouth daily.  . tamsulosin (FLOMAX) 0.4 MG CAPS capsule Take 0.4 mg by mouth at bedtime.  . traMADol (ULTRAM) 50 MG tablet Take 0.5 tablets (25 mg total) by mouth 2 (two) times daily for 7 days.   No  facility-administered encounter medications on file as of 04/07/2019.     Review of Systems  Unable to perform ROS: Dementia    Immunization History  Administered Date(s) Administered  . Influenza Inj Mdck Quad Pf 03/02/2016  . Influenza Whole 02/13/2012  . Influenza, High Dose Seasonal PF 03/13/2019  . Influenza,inj,Quad PF,6+ Mos 03/05/2018  . Influenza-Unspecified 02/18/2014, 02/25/2015, 05/18/2017  . Pneumococcal Conjugate-13 10/06/2014  . Pneumococcal Polysaccharide-23 05/15/1998  . Tdap 11/28/2016  . Zoster 05/15/2006  . Zoster Recombinat (Shingrix) 04/10/2017, 08/07/2017   Pertinent  Health Maintenance Due  Topic Date Due  . INFLUENZA VACCINE  Completed  . PNA vac Low Risk Adult  Completed   Fall Risk  11/04/2018 07/16/2018 11/28/2017 12/27/2016 11/28/2016  Falls in the past year? 1 0 No No No  Number falls in past yr: 0 0 - - -  Injury with Fall? 0 0 - - -  Comment - - - - -  Risk for fall due to : Impaired balance/gait;Impaired vision Impaired mobility;Medication side effect;Impaired balance/gait;Mental status change - - -  Follow up Falls evaluation completed Falls evaluation completed - - -   Functional Status Survey:    Vitals:   04/07/19 1036  BP: 127/75  Pulse: 70  Resp: 18  Temp: (!) 96.8 F (36 C)  SpO2: 94%   There is no height or weight on file to calculate BMI. Physical Exam Vitals signs and nursing note reviewed.  Constitutional:      General: He is not in acute distress.    Appearance: He is ill-appearing.  HENT:     Head: Normocephalic and atraumatic.     Nose: Nose normal. No congestion.     Mouth/Throat:     Mouth: Mucous membranes are dry.     Pharynx: No oropharyngeal exudate.  Eyes:     Conjunctiva/sclera: Conjunctivae normal.     Pupils: Pupils are equal, round, and reactive to light.  Cardiovascular:     Rate and Rhythm: Normal rate and regular rhythm.     Heart sounds: No murmur.  Pulmonary:     Effort: Pulmonary effort is  normal.     Breath sounds: Rales present.  Abdominal:     General: Bowel sounds are normal. There is no distension.     Palpations: Abdomen is soft.     Tenderness: There is no abdominal tenderness.  Musculoskeletal:        General: Swelling and tenderness (right metocarpal area with warmth and erythema) present.     Right lower leg: No edema.     Left lower leg: No edema.  Skin:    General: Skin is warm and dry.     Findings: Erythema (to both heals, blanches) present.  Neurological:     General: No focal deficit present.     Mental Status: He is alert.     Comments: Oriented to self only which is baseline.      Labs reviewed: Recent Labs    01/10/19  NA 144  K 4.9  BUN 40*  CREATININE 1.9*   No results for input(s): AST, ALT, ALKPHOS, BILITOT, PROT, ALBUMIN in the last 8760 hours. Recent Labs    01/10/19 03/28/19  WBC 7.5 9.6  HGB 12.2* 10.2*  HCT 36* 30*  PLT 214 265   Lab Results  Component Value Date   TSH 1.65 01/10/2019   Lab Results  Component Value Date   HGBA1C 6.1 02/14/2016   Lab Results  Component Value Date   CHOL 164 01/02/2017   HDL 53 01/02/2017   LDLCALC 80 01/02/2017   TRIG 158 01/02/2017   CHOLHDL 4.1 06/01/2012    Significant Diagnostic Results in last 30 days:  No results found.  Assessment/Plan  1. Acute gout due to renal impairment involving right hand Colchicine 0.6 mg x 1 then repeat 0.6 mg 1/2 tab in 1 hr. Renal dosing.  Hold allopurinol  Right hand xray 3 view  2. Dehydration Dry on exam, encourage oral fluids . See below   3. PMR (polymyalgia rheumatica) ([facility]) Probable dx.  No improvement thus far in his symptoms of joint stiffness and pain.  Increase ultram to 50 mg bid and 50 mg q 6 hrs prn. Continue prednisone with food if tolerated 20 mg qd.   4. Late onset Alzheimer's disease with behavioral disturbance ([facility]) Severe in nature. Progressively more confused and weaker over time. D/C lipitor due to lack of  benefit.  5. CKD IV Encourage fluids, renal dose meds. Goals of care are comfort based at this time so we are able to utilize the colchicine but we would like to avoid nephrotoxic medications when possible.   I called and spoke with his daughter Seward Speck. We discussed that he is dehydrated and likely needs an IV but given his dementia with combative behavior this would not be appropriate. He continues to hit and kick during personal care and would likely pull it out. She agreed. We also agreed that he would pull out a tube for feeding and his advanced directives indicate he would not want one. If he has an infection that is easily reversible she would like it treated.  His goals of care at this point are comfort based. We will try to increase his medication for pain and provide him with a soft mattress overlay. His family will come visit tomorrow.    Family/ staff Communication: his daughter Seward Speck  Labs/tests ordered:  CMP CBC CXR right hand xray

## 2019-04-08 ENCOUNTER — Encounter: Payer: Self-pay | Admitting: Internal Medicine

## 2019-04-08 ENCOUNTER — Non-Acute Institutional Stay: Payer: Medicare Other | Admitting: Internal Medicine

## 2019-04-08 DIAGNOSIS — Z515 Encounter for palliative care: Secondary | ICD-10-CM

## 2019-04-08 DIAGNOSIS — F0281 Dementia in other diseases classified elsewhere with behavioral disturbance: Secondary | ICD-10-CM

## 2019-04-08 DIAGNOSIS — G301 Alzheimer's disease with late onset: Secondary | ICD-10-CM | POA: Diagnosis not present

## 2019-04-08 DIAGNOSIS — F02818 Dementia in other diseases classified elsewhere, unspecified severity, with other behavioral disturbance: Secondary | ICD-10-CM

## 2019-04-08 DIAGNOSIS — R339 Retention of urine, unspecified: Secondary | ICD-10-CM

## 2019-04-08 DIAGNOSIS — N184 Chronic kidney disease, stage 4 (severe): Secondary | ICD-10-CM

## 2019-04-08 DIAGNOSIS — M10341 Gout due to renal impairment, right hand: Secondary | ICD-10-CM

## 2019-04-08 DIAGNOSIS — N179 Acute kidney failure, unspecified: Secondary | ICD-10-CM | POA: Diagnosis not present

## 2019-04-08 DIAGNOSIS — M353 Polymyalgia rheumatica: Secondary | ICD-10-CM | POA: Diagnosis not present

## 2019-04-08 DIAGNOSIS — C674 Malignant neoplasm of posterior wall of bladder: Secondary | ICD-10-CM

## 2019-04-08 NOTE — Progress Notes (Signed)
Patient ID: Damon Miller, male   DOB: 10/16/1919, 85 y.o.   MRN: [id number]  Location:  [facility] Room Number: memory care 302 Place of Service:  ALF [phone]) Provider:   Gayland Curry, DO  Patient Care Team: Gayland Curry, DO as PCP - General (Geriatric Medicine) Community, [facility], Denice Bors, MD as Consulting Physician (Cardiology) Jacolyn Reedy, MD as Consulting Physician (Cardiology) Tyler Pita, MD as Consulting Physician (Radiation Oncology) Allyn Kenner, MD as Consulting Physician (Dermatology) Ardis Hughs, MD as Attending Physician (Urology) Royal Hawthorn, NP as Nurse Practitioner (Nurse Practitioner)  Extended Emergency Contact Information Primary Emergency Contact: Red,"Wat" Tyrone [place] of [place] Phone: [phone] Relation: Son Secondary Emergency Contact: Vella Raring" Address: [address]          [place], [place] [address] Johnnette Litter of [place] Mobile Phone: [phone] Relation: Daughter  Code Status:  DNR, MOST Goals of care: Advanced Directive information Advanced Directives 04/07/2019  Does Patient Have a Medical Advance Directive? Yes  Type of Paramedic of [place];Living will;Out of facility DNR (pink MOST or yellow form)  Does patient want to make changes to medical advance directive? Yes (MAU/Ambulatory/Procedural Areas - Information given)  Copy of [place] in Chart? Yes - validated most recent copy scanned in chart (See row information)  Pre-existing out of facility DNR order (yellow form or pink MOST form) Pink MOST form placed in chart (order not valid for inpatient use);Yellow form placed in chart (order not valid for inpatient use)     Chief Complaint  Patient presents with  . Acute Visit    End of life care    HPI:  Pt is a 85 y.o. male seen today for an acute visit for end of life care.  Damon Miller has been declining considerably the past several days.  He was having increased behaviors with his bathing routine, worse than usual.  His extremities were noted to be stiff and painful.  NP saw him on 11/12 duet o low grade temp and increased evidence of pain.  He had no cough, sore throat or sob at that time.  Had had bladder pain and has h/o hematuria related to bladder ca.  He had urinary retention.  His first covid test was negative.  Labs were ordered.  His uric acid was 7.3 and CRP 10.78, repeat >31.47.  He was started on prednisone 20mg  daily for possible PMR.  He was more agitated, confused and weaker.  He did admit to pain when asked on 11/20.  A lift was being used when he previously ambulated with a walker.  Tramadol was added for pain.    NP was able to speak with his daughter, Seward Speck, and a MOST was completed for no hospitalizations, DNR, comfort care.    He's been afebrile since 11/19.  Yesterday, he reported shortness of breath and said he was going to die.  He was isolated again and another covid test was done.  NP spoke with his daughter again after the MOST was returned and indicated fluids and tube feeding.  She had wanted to be sure these options were discussed with her before giving up, but understood that Damon Miller would not allow an IV or feeding tube to stay in place.  Also, his labs returned with creatinine over 8.  Bladder scan was ordered to r/o retention.    Overnight, he continued to have pain  and roxanol was ordered 5mg  q6h prn pain or dyspnea. He was more peaceful when seen today.  A new mattress overlay was ordered for him, but he was so peaceful that staff have not replaced it at this point.  He had some VERY cold feet and mottling of his feet.    Appears he's also had a gout flare of his right hand after his renal function declined.  The hand remains painful if touched but other parts no longer seemed painful during examination.    Past Medical History:  Diagnosis  Date  . Abnormality of gait   . Allergic rhinitis due to pollen   . Arthritis   . CAD (coronary artery disease)   . Cataracts, bilateral   . Chronic kidney disease, stage II (mild)   . Dementia ([place])   . Diverticulosis   . DNR (do not resuscitate)   . Edema    wears compression hose  . GERD (gastroesophageal reflux disease)   . Glaucoma    Right eye  . Gout, unspecified   . Hernia, hiatal   . Hyperglycemia 2014  . Hyperlipemia   . Hypertension   . Hypothyroid   . Myocardial infarction ([place])   . Prostate cancer ([place])   . Vitamin D deficiency disease    Past Surgical History:  Procedure Laterality Date  . COLONOSCOPY  07/1990, 10/1992, 12/1995 and 02/1999   Polyps removed at each procedure  . EYE SURGERY Bilateral 1999   Cataract removal  . SKIN CANCER EXCISION  2004   Ears  . TRANSURETHRAL RESECTION OF BLADDER TUMOR N/A 02/24/2016   Procedure: TRANSURETHRAL RESECTION OF BLADDER TUMOR (TURBT), BILATERAL RETROGRADE POLYGRAM;  Surgeon: Ardis Hughs, MD;  Location: [facility];  Service: Urology;  Laterality: N/A;    Allergies  Allergen Reactions  . Ibuprofen Rash    Outpatient Encounter Medications as of 04/08/2019  Medication Sig  . ALPRAZolam (XANAX) 0.5 MG tablet Take 0.5 tablets (0.25 mg total) by mouth 2 (two) times a week. On Wednesday and Saturday prior to showers.  . bisacodyl (DULCOLAX) 10 MG suppository Place 10 mg rectally as needed for moderate constipation.  . Emollient (EUCERIN ORIGINAL HEALING) LOTN Apply 1 application topically 2 (two) times daily.  . feeding supplement (BOOST HIGH PROTEIN) LIQD Take 1 Container by mouth daily.  Marland Kitchen latanoprost (XALATAN) 0.005 % ophthalmic solution Place 1 drop into the right eye at bedtime.  Marland Kitchen LORazepam (ATIVAN) 0.5 MG tablet Take 0.5 mg by mouth every 6 (six) hours as needed.  Marland Kitchen morphine 10 MG/5ML solution Take 5 mg by mouth every 4 (four) hours as needed for severe pain.  . nitroGLYCERIN (NITROSTAT) 0.4 MG SL tablet Place  1 tablet (0.4 mg total) under the tongue every 5 (five) minutes as needed for chest pain.  . predniSONE (DELTASONE) 20 MG tablet Take 20 mg by mouth daily with breakfast.  . senna-docusate (SENOKOT-S) 8.6-50 MG tablet Take 2 tablets by mouth at bedtime.  . sertraline (ZOLOFT) 100 MG tablet Take 100 mg by mouth daily.  . tamsulosin (FLOMAX) 0.4 MG CAPS capsule Take 0.4 mg by mouth at bedtime.  . traMADol (ULTRAM) 50 MG tablet Take 1 tablet (50 mg total) by mouth every 6 (six) hours as needed.  . traMADol (ULTRAM) 50 MG tablet Take 1 tablet (50 mg total) by mouth 2 (two) times daily.  . [DISCONTINUED] acetaminophen (TYLENOL) 325 MG tablet Take 650 mg by mouth 2 (two) times daily as needed.   . [DISCONTINUED]  acetaminophen (TYLENOL) 500 MG tablet Take 1,000 mg by mouth 2 (two) times daily.  . [DISCONTINUED] cholecalciferol (VITAMIN D) 1000 UNITS tablet Take 2,000 Units by mouth daily.  . [DISCONTINUED] levothyroxine (SYNTHROID, LEVOTHROID) 50 MCG tablet TAKE 1 TABLET BY MOUTH DAILY FOR THYROID SUPPLEMENT  . [DISCONTINUED] metoprolol succinate (TOPROL XL) 25 MG 24 hr tablet Take 1 tablet (25 mg total) by mouth daily.  . [DISCONTINUED] NAMENDA XR 28 MG CP24 24 hr capsule Take 28 mg by mouth every evening.    No facility-administered encounter medications on file as of 04/08/2019.     Review of Systems  Constitutional: Positive for activity change and appetite change. Negative for chills and fever.  HENT: Negative for congestion.   Respiratory: Negative for cough and shortness of breath.   Cardiovascular: Negative for chest pain.  Genitourinary: Positive for hematuria.       Foley placed  Musculoskeletal: Positive for arthralgias and gait problem.  Skin: Positive for color change.  Neurological: Positive for weakness.  Psychiatric/Behavioral: Negative for agitation, confusion and sleep disturbance. The patient is not nervous/anxious.     Immunization History  Administered Date(s)  Administered  . Influenza Inj Mdck Quad Pf 03/02/2016  . Influenza Whole 02/13/2012  . Influenza, High Dose Seasonal PF 03/13/2019  . Influenza,inj,Quad PF,6+ Mos 03/05/2018  . Influenza-Unspecified 02/18/2014, 02/25/2015, 05/18/2017  . Pneumococcal Conjugate-13 10/06/2014  . Pneumococcal Polysaccharide-23 05/15/1998  . Tdap 11/28/2016  . Zoster 05/15/2006  . Zoster Recombinat (Shingrix) 04/10/2017, 08/07/2017   Pertinent  Health Maintenance Due  Topic Date Due  . INFLUENZA VACCINE  Completed  . PNA vac Low Risk Adult  Completed   Fall Risk  11/04/2018 07/16/2018 11/28/2017 12/27/2016 11/28/2016  Falls in the past year? 1 0 No No No  Number falls in past yr: 0 0 - - -  Injury with Fall? 0 0 - - -  Comment - - - - -  Risk for fall due to : Impaired balance/gait;Impaired vision Impaired mobility;Medication side effect;Impaired balance/gait;Mental status change - - -  Follow up Falls evaluation completed Falls evaluation completed - - -   Functional Status Survey:    Vitals:   04/08/19 1047  BP: 127/75  Pulse: 70  Resp: 18  Temp: (!) 96.1 F (35.6 C)  TempSrc: Oral  Weight: 179 lb (81.2 kg)  Height: 5\' 9"  (1.753 m)   Body mass index is 26.43 kg/m. Physical Exam Vitals signs and nursing note reviewed.  Constitutional:      Comments: Did not open eyes for exam when awoken, mumbled a little bit when feet examined  HENT:     Head: Normocephalic and atraumatic.  Cardiovascular:     Rate and Rhythm: Normal rate and regular rhythm.     Pulses: Normal pulses.     Heart sounds: Normal heart sounds.  Pulmonary:     Effort: Pulmonary effort is normal.     Breath sounds: No wheezing, rhonchi or rales.  Abdominal:     General: Bowel sounds are normal.  Musculoskeletal:     Right lower leg: No edema.     Left lower leg: No edema.  Skin:    Comments: Feet appear cold and mottled (red and white splotches)  Neurological:     Motor: Weakness present.     Gait: Gait abnormal.      Comments: lethargic  Psychiatric:     Comments: Could not be assessed with his level of consciousness     Labs reviewed: Recent Labs  01/10/19  NA 144  K 4.9  BUN 40*  CREATININE 1.9*   No results for input(s): AST, ALT, ALKPHOS, BILITOT, PROT, ALBUMIN in the last 8760 hours. Recent Labs    01/10/19 03/28/19  WBC 7.5 9.6  HGB 12.2* 10.2*  HCT 36* 30*  PLT 214 265   Lab Results  Component Value Date   TSH 1.65 01/10/2019   Lab Results  Component Value Date   HGBA1C 6.1 02/14/2016   Lab Results  Component Value Date   CHOL 164 01/02/2017   HDL 53 01/02/2017   LDLCALC 80 01/02/2017   TRIG 158 01/02/2017   CHOLHDL 4.1 06/01/2012    Assessment/Plan 1. End of life care -appears Damon Miller has reached end of life due to his renal failure at this stage -he was not eating or drinking well in context of his pain -for comfort care as NP discussed with his daughter, Seward Speck, yesterday -I have opted to increase his roxanol to 20mg /ml 5mg  po q2h prn sob or pain -increased ativan to 0.5mg  po q4 h prn anxiety or dyspnea -added atropine drops 2 drops q 6h prn increased secretions -may need to schedule the roxanol if he develops apnea or distress  2. Acute renal failure superimposed on stage 4 chronic kidney disease, unspecified acute renal failure type ([place]) -creatinine had exceeded 8 and pt's dementia means he will not permit IV placement for fluids--he gets agitated with bathing and recently touch due to his discomfort -maintain comfort with moisture in mouth with sponges as needed  3. Acute gout due to renal impairment involving right hand -seems to have less pain now since colchicine -cont roxanol therapy  4. PMR (polymyalgia rheumatica) ([facility]) -on prednisone therapy which does not appear to have affected him much, unfortunately, so not so sure this was the cause (vs. His acute gout)  5. Late onset Alzheimer's disease with behavioral disturbance ([place]) -advanced stage, had  been dependent in adls except some short walks with walker and feeding himself, but now fully dependent with lift and not able to take po due to lethargy  6. Urinary retention with incomplete bladder emptying -cont foley catheter, very little dark, hematuria present in bag this am  7. Malignant neoplasm of posterior wall of urinary bladder ([facility]) -? Contributing to his retention he had/obstructive contributor to his renal failure along with dehydration from lack of po intake -family had opted for no further therapies for his bladder ca after his last treatment  Family/ staff Communication: discussed with memory care nurse  Labs/tests ordered:  None  Fed Ceci L. Dangelo Miller, D.O. [facility] [address], [place] [address] Cell Phone (Mon-Fri 8am-5pm):  [phone] On Call:  [phone] & follow prompts after 5pm & weekends Office Phone:  [phone] Office Fax:  [phone]

## 2019-04-09 ENCOUNTER — Other Ambulatory Visit: Payer: Self-pay | Admitting: Internal Medicine

## 2019-04-09 DIAGNOSIS — Z515 Encounter for palliative care: Secondary | ICD-10-CM

## 2019-04-09 MED ORDER — MORPHINE SULFATE (CONCENTRATE) 20 MG/ML PO SOLN: 5.0000 mg | ORAL | 0 refills | Status: AC

## 2019-04-14 ENCOUNTER — Encounter: Payer: Self-pay | Admitting: Internal Medicine

## 2019-04-15 DEATH — deceased

## 2019-11-12 NOTE — Telephone Encounter (Signed)
This encounter was created in error - please disregard.
